# Patient Record
Sex: Male | Born: 1950
Health system: Southern US, Community
[De-identification: ages and names within clinical notes are randomized; demographics above are authoritative.]

## PROBLEM LIST (undated history)

## (undated) DIAGNOSIS — F3160 Bipolar disorder, current episode mixed, unspecified: Secondary | ICD-10-CM

## (undated) DIAGNOSIS — R001 Bradycardia, unspecified: Secondary | ICD-10-CM

## (undated) DIAGNOSIS — K2270 Barrett's esophagus without dysplasia: Secondary | ICD-10-CM

## (undated) DIAGNOSIS — K219 Gastro-esophageal reflux disease without esophagitis: Secondary | ICD-10-CM

## (undated) DIAGNOSIS — K5792 Diverticulitis of intestine, part unspecified, without perforation or abscess without bleeding: Secondary | ICD-10-CM

## (undated) DIAGNOSIS — G4730 Sleep apnea, unspecified: Secondary | ICD-10-CM

## (undated) DIAGNOSIS — N400 Enlarged prostate without lower urinary tract symptoms: Secondary | ICD-10-CM

## (undated) DIAGNOSIS — I10 Essential (primary) hypertension: Secondary | ICD-10-CM

## (undated) HISTORY — DX: Bipolar disorder, current episode mixed, unspecified: F31.60

## (undated) HISTORY — DX: Essential (primary) hypertension: I10

## (undated) HISTORY — PX: COLOSTOMY REVERSAL: SHX5782

## (undated) HISTORY — PX: OTHER SURGICAL HISTORY: SHX169

## (undated) HISTORY — DX: Barrett's esophagus without dysplasia: K22.70

## (undated) HISTORY — DX: Benign prostatic hyperplasia without lower urinary tract symptoms: N40.0

---
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
## 2005-08-04 DIAGNOSIS — K5792 Diverticulitis of intestine, part unspecified, without perforation or abscess without bleeding: Secondary | ICD-10-CM

## 2005-08-04 HISTORY — DX: Diverticulitis of intestine, part unspecified, without perforation or abscess without bleeding: K57.92

## 2012-08-04 LAB — BASIC METABOLIC PANEL
BUN: 12 mg/dL (ref 4–21)
CREATININE: 0.8 mg/dL (ref 0.6–1.3)
Glucose: 122 mg/dL
Potassium: 3.7 mmol/L (ref 3.4–5.3)

## 2012-08-04 LAB — HEPATIC FUNCTION PANEL
ALT: 21 U/L (ref 10–40)
AST: 19 U/L (ref 14–40)
Alkaline Phosphatase: 77 U/L (ref 25–125)
Bilirubin, Total: 1.3 mg/dL

## 2012-08-17 LAB — CALCIUM: Calcium: 10.2 mg/dL

## 2013-09-30 ENCOUNTER — Ambulatory Visit: Payer: Self-pay | Admitting: Family Medicine

## 2013-09-30 ENCOUNTER — Encounter: Payer: Self-pay | Admitting: Family Medicine

## 2013-09-30 ENCOUNTER — Ambulatory Visit (INDEPENDENT_AMBULATORY_CARE_PROVIDER_SITE_OTHER): Payer: BC Managed Care – PPO | Admitting: Family Medicine

## 2013-09-30 VITALS — BP 151/74 | HR 54 | Ht 70.0 in | Wt 212.0 lb

## 2013-09-30 DIAGNOSIS — N400 Enlarged prostate without lower urinary tract symptoms: Secondary | ICD-10-CM

## 2013-09-30 DIAGNOSIS — R259 Unspecified abnormal involuntary movements: Secondary | ICD-10-CM

## 2013-09-30 DIAGNOSIS — Z13220 Encounter for screening for lipoid disorders: Secondary | ICD-10-CM

## 2013-09-30 DIAGNOSIS — R7301 Impaired fasting glucose: Secondary | ICD-10-CM

## 2013-09-30 DIAGNOSIS — E785 Hyperlipidemia, unspecified: Secondary | ICD-10-CM

## 2013-09-30 DIAGNOSIS — F411 Generalized anxiety disorder: Secondary | ICD-10-CM

## 2013-09-30 DIAGNOSIS — K2270 Barrett's esophagus without dysplasia: Secondary | ICD-10-CM

## 2013-09-30 DIAGNOSIS — R251 Tremor, unspecified: Secondary | ICD-10-CM

## 2013-09-30 DIAGNOSIS — F3160 Bipolar disorder, current episode mixed, unspecified: Secondary | ICD-10-CM

## 2013-09-30 DIAGNOSIS — I10 Essential (primary) hypertension: Secondary | ICD-10-CM

## 2013-09-30 HISTORY — DX: Impaired fasting glucose: R73.01

## 2013-09-30 HISTORY — DX: Barrett's esophagus without dysplasia: K22.70

## 2013-09-30 HISTORY — DX: Bipolar disorder, current episode mixed, unspecified: F31.60

## 2013-09-30 HISTORY — DX: Benign prostatic hyperplasia without lower urinary tract symptoms: N40.0

## 2013-09-30 HISTORY — DX: Generalized anxiety disorder: F41.1

## 2013-09-30 HISTORY — DX: Essential (primary) hypertension: I10

## 2013-09-30 MED ORDER — DOXAZOSIN MESYLATE 4 MG PO TABS: 4.0000 mg | ORAL_TABLET | Freq: Every day | ORAL | Status: DC

## 2013-09-30 MED ORDER — PANTOPRAZOLE SODIUM 40 MG PO TBEC: 40.0000 mg | DELAYED_RELEASE_TABLET | Freq: Two times a day (BID) | ORAL | Status: DC

## 2013-09-30 NOTE — Progress Notes (Signed)
CC: Alexander [name] is a 63 y.o. male is here for Establish Care   Subjective: HPI:  Very pleasant 62 year old here to establish care  Patient reports a history of frequent urination at night. He will wait 2-3 times a night to urinate this has been present for over a year a nightly basis has not be getting better or worse recently. He believes he is been taking finasteride in the past without any benefit with urinary habits. In the morning he denies any urgency. There is been no dysuria, incontinence, nor blood in urine. No family history of prostate cancer he had a PSA in 2012 0.5.  Reports a history of essential hypertension he is currently taking benazepril and amlodipine. No outside blood pressures to report. Tries to stay active no formal exercise routine  History of Barrett's esophagus taking Protonix 40 mg twice a day without any difficulty swallowing or painful swallowing. Denies vomiting or regurgitation    On chart review he has a history of impaired fasting glucose with sugar checked most recently a little over a year ago.  He has a history of bipolar disorder currently seeing a psychiatrist his lithium level was checked last week and he believes it was therapeutic. His psychiatrist is also managing generalized anxiety disorder  He is in acute complaint today of a tremor of his right leg that has been present for over 5 years occurs during purposeful movements also at rest. It is been present ever since he had surgery for perforated diverticula. Denies tremors elsewhere, denies family history for Parkinson's. Denies personal history of falls nor rigidity. Tremor is not interfering with quality of life at this time   Review of Systems - General ROS: negative for - chills, fever, night sweats, weight gain or weight loss Ophthalmic ROS: negative for - decreased vision Psychological ROS: negative for - anxiety or depression ENT ROS: negative for - hearing change, nasal congestion,  tinnitus or allergies Hematological and Lymphatic ROS: negative for - bleeding problems, bruising or swollen lymph nodes Breast ROS: negative Respiratory ROS: no cough, shortness of breath, or wheezing Cardiovascular ROS: no chest pain or dyspnea on exertion Gastrointestinal ROS: no abdominal pain, change in bowel habits, or black or bloody stools Genito-Urinary ROS: negative for - genital discharge, genital ulcers, incontinence or abnormal bleeding from genitals Musculoskeletal ROS: negative for - joint pain or muscle pain Neurological ROS: negative for - headaches or memory loss Dermatological ROS: negative for lumps, mole changes, rash and skin lesion changes  Past Medical History  Diagnosis Date  . Barrett's esophagus 09/30/2013  . Bipolar 1 disorder, mixed 09/30/2013    [facility], Dr. Ardine Eng   . BPH (benign prostatic hyperplasia) 09/30/2013  . Essential hypertension, benign 09/30/2013    Past Surgical History  Procedure Laterality Date  . Colostomy reversal    . Large bowel perforation     No family history on file.  History   Social History  . Marital Status: Married    Spouse Name: N/A    Number of Children: N/A  . Years of Education: N/A   Occupational History  . Not on file.   Social History Main Topics  . Smoking status: Former Smoker    Quit date: 09/30/1980  . Smokeless tobacco: Not on file  . Alcohol Use: 7.0 oz/week    14 drink(s) per week  . Drug Use: No  . Sexual Activity: Not Currently    Partners: Female   Other Topics Concern  . Not  on file   Social History Narrative  . No narrative on file     Objective: BP 151/74  Pulse 54  Ht 5\' 10"  (1.778 m)  Wt 212 lb (96.163 kg)  BMI 30.42 kg/m2   General: Alert and Oriented, No Acute Distress HEENT: Pupils equal, round, reactive to light. Conjunctivae clear.  Moist mucous membranes pharynx unremarkable Lungs: Clear to auscultation bilaterally, no wheezing/ronchi/rales.  Comfortable work of  breathing. Good air movement. Cardiac: Regular rate and rhythm. Normal S1/S2.  No murmurs, rubs, nor gallops.   Neuro: CN II-XII grossly intact, full strength/rom of all four extremities, gait normal, rapid alternating movements normal, heel-shin test normal, no cogwheeling in the upper extremities Extremities: No peripheral edema.  Strong peripheral pulses.  Mental Status: No depression, anxiety, nor agitation. Skin: Warm and dry.  Assessment & Plan: Alexander [name] was seen today for establish care.  Diagnoses and associated orders for this visit:  Essential hypertension, benign - BASIC METABOLIC PANEL WITH GFR  Barrett's esophagus - pantoprazole (PROTONIX) 40 MG tablet; Take 1 tablet (40 mg total) by mouth 2 (two) times daily.  Impaired fasting glucose  BPH (benign prostatic hyperplasia) - doxazosin (CARDURA) 4 MG tablet; Take 1 tablet (4 mg total) by mouth daily.  Bipolar 1 disorder, mixed  Generalized anxiety disorder  Lipid screening - Lipid panel  Tremor  Hyperlipidemia    Essential hypertension: Uncontrolled starting doxazosin Barrett's esophagus: Controlled continue Protonix twice a day Impaired fasting glucose: Recheck fasting glucose today BPH: Uncontrolled starting doxazosin Bipolar 1 disorder and generalized anxiety disorder: Controlled continue to see psychiatry Hyperlipidemia: Uncontrolled as of most recent lipid panel 2012 repeating fasting lipid panel today   Return in about 4 weeks (around 10/28/2013).

## 2013-10-01 LAB — BASIC METABOLIC PANEL WITH GFR
BUN: 11 mg/dL (ref 6–23)
CHLORIDE: 103 meq/L (ref 96–112)
CO2: 26 mEq/L (ref 19–32)
Calcium: 10 mg/dL (ref 8.4–10.5)
Creat: 0.86 mg/dL (ref 0.50–1.35)
Glucose, Bld: 108 mg/dL — ABNORMAL HIGH (ref 70–99)
Potassium: 4.1 mEq/L (ref 3.5–5.3)
SODIUM: 139 meq/L (ref 135–145)

## 2013-10-01 LAB — LIPID PANEL
CHOL/HDL RATIO: 5 ratio
CHOLESTEROL: 235 mg/dL — AB (ref 0–200)
HDL: 47 mg/dL (ref >39)
LDL Cholesterol: 134 mg/dL — ABNORMAL HIGH (ref 0–99)
Triglycerides: 270 mg/dL — ABNORMAL HIGH (ref <150)
VLDL: 54 mg/dL — AB (ref 0–40)

## 2013-10-03 ENCOUNTER — Telehealth: Payer: Self-pay | Admitting: Family Medicine

## 2013-10-03 DIAGNOSIS — E785 Hyperlipidemia, unspecified: Secondary | ICD-10-CM | POA: Insufficient documentation

## 2013-10-03 HISTORY — DX: Hyperlipidemia, unspecified: E78.5

## 2013-10-03 MED ORDER — ATORVASTATIN CALCIUM 20 MG PO TABS: 20.0000 mg | ORAL_TABLET | Freq: Every day | ORAL | Status: DC

## 2013-10-03 NOTE — Telephone Encounter (Signed)
Pt.notified

## 2013-10-03 NOTE — Telephone Encounter (Signed)
Seth Bake, Will you please let Mr. Lannen know that his cholesterol values are elevated and put him at a 19.5% risk of having a heart attack in the next ten years.  Current guidelines from the american heart association lead me to strongly recommend that he start a cholesterol lowering medication called atorvastatin to help lower his numbers and his risk.  [facility], we'll want to recheck this in 3 months.  Kidney function was normal and fasting blood sugar remains in the pre-diabetes range, no other recommendations at this time.

## 2013-10-20 ENCOUNTER — Encounter: Payer: Self-pay | Admitting: Family Medicine

## 2013-10-20 DIAGNOSIS — Z8719 Personal history of other diseases of the digestive system: Secondary | ICD-10-CM | POA: Insufficient documentation

## 2013-10-20 DIAGNOSIS — Z299 Encounter for prophylactic measures, unspecified: Secondary | ICD-10-CM

## 2013-10-20 HISTORY — DX: Personal history of other diseases of the digestive system: Z87.19

## 2013-10-20 HISTORY — DX: Encounter for prophylactic measures, unspecified: Z29.9

## 2013-10-26 ENCOUNTER — Encounter: Payer: Self-pay | Admitting: *Deleted

## 2013-10-28 ENCOUNTER — Ambulatory Visit (INDEPENDENT_AMBULATORY_CARE_PROVIDER_SITE_OTHER): Payer: BC Managed Care – PPO | Admitting: Family Medicine

## 2013-10-28 ENCOUNTER — Encounter: Payer: Self-pay | Admitting: Family Medicine

## 2013-10-28 VITALS — BP 130/73 | HR 63 | Wt 214.0 lb

## 2013-10-28 DIAGNOSIS — I10 Essential (primary) hypertension: Secondary | ICD-10-CM

## 2013-10-28 DIAGNOSIS — N400 Enlarged prostate without lower urinary tract symptoms: Secondary | ICD-10-CM

## 2013-10-28 DIAGNOSIS — E785 Hyperlipidemia, unspecified: Secondary | ICD-10-CM

## 2013-10-28 MED ORDER — DOXAZOSIN MESYLATE 4 MG PO TABS: 8.0000 mg | ORAL_TABLET | Freq: Every day | ORAL | Status: DC

## 2013-10-28 MED ORDER — AMLODIPINE BESYLATE 10 MG PO TABS: 10.0000 mg | ORAL_TABLET | Freq: Every day | ORAL | Status: DC

## 2013-10-28 NOTE — Progress Notes (Signed)
CC: Alexander [name] is a 63 y.o. male is here for Hypertension   Subjective: HPI:  Followup BPH: Started doxazosin at our last visit he has been taking this daily without known side effects. Reports he is awaking 1-2 times a night to urinate he is very happy with the response. Denies urinary hesitancy, urgency, nor sensation of incomplete voiding  Followup essential hypertension: Continues on amlodipine, benazepril, and new doxazosin. No outside blood pressures to report. Denies chest pain shortness of breath orthopnea nor peripheral edema  Followup hyperlipidemia: After his last visit he was recommended to start atorvastatin due to elevated LDL, he's been taking this on a daily basis without right upper quadrant pain nor myalgias.   Review Of Systems Outlined In HPI  Past Medical History  Diagnosis Date  . Barrett's esophagus 09/30/2013  . Bipolar 1 disorder, mixed 09/30/2013    [facility], Dr. Ardine Eng   . BPH (benign prostatic hyperplasia) 09/30/2013  . Essential hypertension, benign 09/30/2013    Past Surgical History  Procedure Laterality Date  . Colostomy reversal    . Large bowel perforation     No family history on file.  History   Social History  . Marital Status: Married    Spouse Name: N/A    Number of Children: N/A  . Years of Education: N/A   Occupational History  . Not on file.   Social History Main Topics  . Smoking status: Former Smoker    Quit date: 09/30/1980  . Smokeless tobacco: Not on file  . Alcohol Use: 7.0 oz/week    14 drink(s) per week  . Drug Use: No  . Sexual Activity: Not Currently    Partners: Female   Other Topics Concern  . Not on file   Social History Narrative  . No narrative on file     Objective: BP 130/73  Pulse 63  Wt 214 lb (97.07 kg)  General: Alert and Oriented, No Acute Distress HEENT: Pupils equal, round, reactive to light. Conjunctivae clear.  Moist mucous membranes pharynx unremarkable Lungs: Clear to  auscultation bilaterally, no wheezing/ronchi/rales.  Comfortable work of breathing. Good air movement. Cardiac: Regular rate and rhythm. Normal S1/S2.  No murmurs, rubs, nor gallops.   Extremities: No peripheral edema.  Strong peripheral pulses.  Mental Status: No depression, anxiety, nor agitation. Skin: Warm and dry.  Assessment & Plan: Alexander [name] was seen today for hypertension.  Diagnoses and associated orders for this visit:  BPH (benign prostatic hyperplasia) - doxazosin (CARDURA) 4 MG tablet; Take 2 tablets (8 mg total) by mouth daily.  Essential hypertension, benign - amLODipine (NORVASC) 10 MG tablet; Take 1 tablet (10 mg total) by mouth daily.  Hyperlipidemia    BPH: Improving but still uncontrolled increasing doxazosin now 8 mg daily Essential hypertension: Controlled and improved continue antihypertensive regimen Hyperlipidemia: Tolerating atorvastatin we'll need cholesterol check in 2 months   Return in about 2 months (around 12/28/2013) for Cholesterol followup.

## 2013-12-28 ENCOUNTER — Encounter: Payer: Self-pay | Admitting: Family Medicine

## 2013-12-28 ENCOUNTER — Ambulatory Visit (INDEPENDENT_AMBULATORY_CARE_PROVIDER_SITE_OTHER): Payer: BC Managed Care – PPO | Admitting: Family Medicine

## 2013-12-28 ENCOUNTER — Telehealth: Payer: Self-pay | Admitting: Family Medicine

## 2013-12-28 VITALS — BP 142/76 | HR 47 | Wt 215.0 lb

## 2013-12-28 DIAGNOSIS — Z5181 Encounter for therapeutic drug level monitoring: Secondary | ICD-10-CM

## 2013-12-28 DIAGNOSIS — E785 Hyperlipidemia, unspecified: Secondary | ICD-10-CM

## 2013-12-28 DIAGNOSIS — Z79899 Other long term (current) drug therapy: Secondary | ICD-10-CM

## 2013-12-28 DIAGNOSIS — Z9989 Dependence on other enabling machines and devices: Secondary | ICD-10-CM

## 2013-12-28 DIAGNOSIS — M25519 Pain in unspecified shoulder: Secondary | ICD-10-CM

## 2013-12-28 DIAGNOSIS — G4733 Obstructive sleep apnea (adult) (pediatric): Secondary | ICD-10-CM | POA: Insufficient documentation

## 2013-12-28 DIAGNOSIS — L738 Other specified follicular disorders: Secondary | ICD-10-CM

## 2013-12-28 DIAGNOSIS — R7301 Impaired fasting glucose: Secondary | ICD-10-CM

## 2013-12-28 DIAGNOSIS — L731 Pseudofolliculitis barbae: Secondary | ICD-10-CM

## 2013-12-28 DIAGNOSIS — I10 Essential (primary) hypertension: Secondary | ICD-10-CM

## 2013-12-28 DIAGNOSIS — M25511 Pain in right shoulder: Secondary | ICD-10-CM

## 2013-12-28 DIAGNOSIS — L678 Other hair color and hair shaft abnormalities: Secondary | ICD-10-CM

## 2013-12-28 HISTORY — DX: Obstructive sleep apnea (adult) (pediatric): G47.33

## 2013-12-28 LAB — HEPATIC FUNCTION PANEL
ALBUMIN: 4.3 g/dL (ref 3.5–5.2)
ALT: 38 U/L (ref 0–53)
AST: 26 U/L (ref 0–37)
Alkaline Phosphatase: 78 U/L (ref 39–117)
Bilirubin, Direct: 0.2 mg/dL (ref 0.0–0.3)
Indirect Bilirubin: 1 mg/dL (ref 0.2–1.2)
Total Bilirubin: 1.2 mg/dL (ref 0.2–1.2)
Total Protein: 6.8 g/dL (ref 6.0–8.3)

## 2013-12-28 LAB — HEMOGLOBIN A1C
Hgb A1c MFr Bld: 5.5 % (ref <5.7)
MEAN PLASMA GLUCOSE: 111 mg/dL (ref <117)

## 2013-12-28 LAB — LIPID PANEL
CHOL/HDL RATIO: 2.9 ratio
Cholesterol: 160 mg/dL (ref 0–200)
HDL: 55 mg/dL (ref >39)
LDL Cholesterol: 75 mg/dL (ref 0–99)
Triglycerides: 149 mg/dL (ref <150)
VLDL: 30 mg/dL (ref 0–40)

## 2013-12-28 MED ORDER — BENAZEPRIL HCL 20 MG PO TABS: 20.0000 mg | ORAL_TABLET | Freq: Every day | ORAL | Status: DC

## 2013-12-28 MED ORDER — CLINDAMYCIN HCL 300 MG PO CAPS: 300.0000 mg | ORAL_CAPSULE | Freq: Three times a day (TID) | ORAL | Status: DC

## 2013-12-28 NOTE — Telephone Encounter (Signed)
Alexander [name], We please see if [facility] has this patient already in their system. If so he is requesting new CPAP mask and supplies, if so can you please help arrange this.  If he is not already in their system can you also please let me know so I can look for which sleep Center he had his original sleep study done?

## 2013-12-28 NOTE — Progress Notes (Addendum)
CC: Alexander [name] is a 63 y.o. male is here for f/u fasting labs   Subjective: HPI:  Followup hyperlipidemia: Continues to take Lipitor on a daily basis without myalgias nor right upper quadrant pain. He's been sticking to a low fat diet with the help of his wife. No formal exercise routine  Followup hypertension: Continues on amlodipine, benazepril, doxazosin on a daily basis. Reports blood pressures at home consistently below 140/90. He did not take any of his medication today. Denies chest pain shortness of breath orthopnea nor peripheral edema. No motor or sensory disturbances  History of impaired fasting glucose with a mildly elevated glucose fasting at his most recent visit. No polyuria polyphasia or polydipsia  On the back of his left thigh he has a tender red lump that has been present for the past 5 days. Worse the more he squeezes it. Nothing particularly makes it better. Interventions have included a Band-Aid. Denies discharge. Denies fevers, chills, nor personal history of skin cancers  Complains of right shoulder pain has been present for one month it has slowly been improving without any intervention other than rest. It is described as a deep pain that is worse with putting his hand behind his back. It is nonradiating. Denies any recent or remote overexertion. Denies overlying swelling redness or warmth nor weakness in the right upper extremity. Denies neck pain  Tells me has a history of obstructive sleep apnea currently using a CPAP machine and states that the housing and mask is starting to crack. He believes his most recent sleep study was done sometime around a decade ago at the [facility] sleep lab he is unsure of specifics beyond that.  He uses CPAP nightly, he feels he benefits from CPAP    Review Of Systems Outlined In HPI  Past Medical History  Diagnosis Date  . Barrett's esophagus 09/30/2013  . Bipolar 1 disorder, mixed 09/30/2013    [facility], Dr. Ardine Eng    . BPH (benign prostatic hyperplasia) 09/30/2013  . Essential hypertension, benign 09/30/2013    Past Surgical History  Procedure Laterality Date  . Colostomy reversal    . Large bowel perforation     No family history on file.  History   Social History  . Marital Status: Married    Spouse Name: N/A    Number of Children: N/A  . Years of Education: N/A   Occupational History  . Not on file.   Social History Main Topics  . Smoking status: Former Smoker    Quit date: 09/30/1980  . Smokeless tobacco: Not on file  . Alcohol Use: 7.0 oz/week    14 drink(s) per week  . Drug Use: No  . Sexual Activity: Not Currently    Partners: Female   Other Topics Concern  . Not on file   Social History Narrative  . No narrative on file     Objective: BP 142/76  Pulse 47  Wt 215 lb (97.523 kg)  General: Alert and Oriented, No Acute Distress HEENT: Pupils equal, round, reactive to light. Conjunctivae clear.  Moist mucous membranes pharynx unremarkable Lungs: Clear to auscultation bilaterally, no wheezing/ronchi/rales.  Comfortable work of breathing. Good air movement. Cardiac: Regular rate and rhythm. Normal S1/S2.  No murmurs, rubs, nor gallops.   Abdomen: Soft nontender Extremities: No peripheral edema.  Strong peripheral pulses. Right shoulder exam reveals full range of motion and strength in all planes of motion and with individual rotator cuff testing. No overlying redness warmth or swelling.  Neer's test negative.  Hawkins test negative. Empty can negative. Crossarm test negative. O'Brien's test positive. Apprehension test negative. Speed's test negative. Pain is reproduced with resisted internal rotation of the humerus on the back Mental Status: No depression, anxiety, nor agitation. Skin: Warm and dry. On the posterior medial left thigh there is a pustule approximately 3-4 mm in dim at her and slightly raised, erythematous surrounding a hair follicle  Assessment & Plan: Makhari Dovidio  was seen today for f/u fasting labs.  Diagnoses and associated orders for this visit:  Essential hypertension, benign - benazepril (LOTENSIN) 20 MG tablet; Take 1 tablet (20 mg total) by mouth daily.  Hyperlipidemia - Hepatic function panel - Lipid panel  Impaired fasting glucose - Hemoglobin A1c  Encounter for monitoring statin therapy - Hepatic function panel  Ingrown hair - clindamycin (CLEOCIN) 300 MG capsule; Take 1 capsule (300 mg total) by mouth 3 (three) times daily.  Right shoulder pain  OSA on CPAP    Essential hypertension: Uncontrolled encouraged him to take all of his medications on a daily basis even if fasting, suspect his mild elevation today is due to not taking medications hyperlipidemia: Due for repeat lipid panel along with liver enzymes Impaired fasting glucose clinically controlled to do for A1c Right shoulder pain: Discussed possibility of rotator cuff tendinitis or even labral tear, he would prefer no further intervention since pain is improving on its own. Discussed injections available with me and our colleagues if needed in the future Ingrown hair: Start clindamycin and warm compresses 15 minutes at a time 4 times a day Obstructive sleep apnea: We will attempt to obtain his most recent sleep study and then will look into [facility] to help with supplies.  He could benefit from a new CPAP with new technology and new supplies.   Return in about 3 months (around 03/30/2014).

## 2013-12-29 ENCOUNTER — Telehealth: Payer: Self-pay | Admitting: Family Medicine

## 2013-12-29 MED ORDER — ATORVASTATIN CALCIUM 20 MG PO TABS: 20.0000 mg | ORAL_TABLET | Freq: Every day | ORAL | Status: DC

## 2013-12-29 NOTE — Telephone Encounter (Signed)
Pt notified via vm

## 2013-12-29 NOTE — Telephone Encounter (Signed)
Alexander [name], Will you please let patient know that his cholesterol has drastically improved and is now perfect.  I'd recommend he continue taking atorvastatin since no signs of liver inflammation were present.  [facility]  F/U in about three months.

## 2013-12-29 NOTE — Telephone Encounter (Signed)
Called and left message for pt to let us know where he had original sleep study done;[facility] does not have a sleep study in their system

## 2013-12-29 NOTE — Telephone Encounter (Signed)
Pt.notified

## 2014-01-05 NOTE — Telephone Encounter (Signed)
Seth Bake, If he can let me know who had been servicing or providing him with his CPAP supplies I may be able to request new equipment without having to track down his old sleep study.

## 2014-01-06 NOTE — Telephone Encounter (Signed)
It was [facility] who did last supply him back in Dec 2014 however their policy has changed and they now require sleep study to be on file

## 2014-01-09 NOTE — Telephone Encounter (Signed)
Alexander [name], Will you please make sure the patient is aware that a new sleep study would be required for new equipment or parts.  If he's interested in this I can arrange it through our  sleep lab.  Just let me know if he'd like to go through with it.

## 2014-01-09 NOTE — Telephone Encounter (Signed)
They are aware. When I first spoke with his wife I informed her of this policy change. And she said they would call us back and let us know when they tracked it down

## 2014-01-12 MED ORDER — AMBULATORY NON FORMULARY MEDICATION: Status: DC

## 2014-01-12 NOTE — Telephone Encounter (Signed)
Alexander [name], I spoke with Jeneen Rinks about this.  I think it's all taken care of now.  Will you ask patient to let me know if not contacted about supplies by next week.

## 2014-01-12 NOTE — Telephone Encounter (Signed)
Pt's spouse notified.

## 2014-01-12 NOTE — Addendum Note (Signed)
Addended by: Marcial Pacas on: 01/12/2014 12:24 PM   Modules accepted: Orders

## 2014-01-12 NOTE — Telephone Encounter (Signed)
Seth Bake, Will you please let [facility] know that we now have Alexander [name]'s sleep study.  It is from 2004 and I'm wondering if [facility] can use this to prove him with CPAP supplies, he already has a machine but the mask and tubing is breaking.  Report in your inbox, please place in scan folder after you're done with it.

## 2014-03-11 ENCOUNTER — Other Ambulatory Visit: Payer: Self-pay | Admitting: Family Medicine

## 2014-04-08 ENCOUNTER — Other Ambulatory Visit: Payer: Self-pay | Admitting: Family Medicine

## 2014-04-12 ENCOUNTER — Encounter: Payer: Self-pay | Admitting: Family Medicine

## 2014-04-12 ENCOUNTER — Ambulatory Visit (INDEPENDENT_AMBULATORY_CARE_PROVIDER_SITE_OTHER): Payer: BC Managed Care – PPO | Admitting: Family Medicine

## 2014-04-12 VITALS — BP 126/63 | HR 59 | Wt 219.0 lb

## 2014-04-12 DIAGNOSIS — Z23 Encounter for immunization: Secondary | ICD-10-CM

## 2014-04-12 DIAGNOSIS — I10 Essential (primary) hypertension: Secondary | ICD-10-CM

## 2014-04-12 DIAGNOSIS — IMO0002 Reserved for concepts with insufficient information to code with codable children: Secondary | ICD-10-CM

## 2014-04-12 DIAGNOSIS — Z9989 Dependence on other enabling machines and devices: Secondary | ICD-10-CM

## 2014-04-12 DIAGNOSIS — L74519 Primary focal hyperhidrosis, unspecified: Secondary | ICD-10-CM

## 2014-04-12 DIAGNOSIS — N400 Enlarged prostate without lower urinary tract symptoms: Secondary | ICD-10-CM

## 2014-04-12 DIAGNOSIS — E785 Hyperlipidemia, unspecified: Secondary | ICD-10-CM

## 2014-04-12 DIAGNOSIS — M76892 Other specified enthesopathies of left lower limb, excluding foot: Secondary | ICD-10-CM

## 2014-04-12 DIAGNOSIS — G4733 Obstructive sleep apnea (adult) (pediatric): Secondary | ICD-10-CM

## 2014-04-12 DIAGNOSIS — R61 Generalized hyperhidrosis: Secondary | ICD-10-CM

## 2014-04-12 MED ORDER — ALUMINUM CHLORIDE IN ALCOHOL 6.25 % EX SOLN: CUTANEOUS | Status: DC

## 2014-04-12 MED ORDER — ATORVASTATIN CALCIUM 20 MG PO TABS: 20.0000 mg | ORAL_TABLET | Freq: Every day | ORAL | Status: DC

## 2014-04-12 MED ORDER — DOXAZOSIN MESYLATE 4 MG PO TABS: ORAL_TABLET | ORAL | Status: DC

## 2014-04-12 NOTE — Progress Notes (Signed)
CC: Alexander [name] is a 63 y.o. male is here for Hypertension   Subjective: HPI:  Follow BPH: Continues on doxazosin on a daily basis, 8 mg a day. Ever since he started this medication he is no longer awakening multiple times during the night to urinate. He estimates 0-1 times a night he has to wake up to urinate. Denies weak stream, urinary hesitancy, nor straining to urinate.  Followup essential hypertension: No outside blood pressures to report. He's back to taking his medication on a daily basis since I saw him last taking amlodipine and benazepril without known intolerances. Denies chest pain shortness of breath orthopnea nor peripheral edema  Followup hyperlipidemia: Continues to take Lipitor a daily basis without side effects or known intolerances. Denies right upper quadrant pain nor myalgias. When his cholesterol was checked after he started this medication it had drastically improved into the normal range.  Followup OSA: Since I saw him last he has had new supplies delivered for his CPAP machine and is using it on a nightly basis and states that he believes he gets benefit from restorative sleep now.  Complains of left knee pain that has been present for the past one to 2 months on a daily basis. Described as a sharp discomfort on the medial lower anterior aspect of the knee. Symptoms are moderate to severe in severity for the first 10-20 steps and then resolve 100% after he has been moving beyond the above distance. Denies any pain at rest. Denies any catching locking or giving way but he does report occasional feels unstable. Denies any recent or remote trauma. Denies swelling redness nor warmth around the joint.  Complains of malodorous feet over the past 3-4 months on a daily basis unless he wears sandals. Interventions have included multiple over-the-counter powders without much benefit. Describes symptoms of severe in severity. Review of systems is positive for Frequent sweating of  the feet even when they're not covered. He denies any skin changes involving the foot.   Review Of Systems Outlined In HPI  Past Medical History  Diagnosis Date  . Barrett's esophagus 09/30/2013  . Bipolar 1 disorder, mixed 09/30/2013    [facility], Dr. Ardine Eng   . BPH (benign prostatic hyperplasia) 09/30/2013  . Essential hypertension, benign 09/30/2013    Past Surgical History  Procedure Laterality Date  . Colostomy reversal    . Large bowel perforation     No family history on file.  History   Social History  . Marital Status: Married    Spouse Name: N/A    Number of Children: N/A  . Years of Education: N/A   Occupational History  . Not on file.   Social History Main Topics  . Smoking status: Former Smoker    Quit date: 09/30/1980  . Smokeless tobacco: Not on file  . Alcohol Use: 7.0 oz/week    14 drink(s) per week  . Drug Use: No  . Sexual Activity: Not Currently    Partners: Female   Other Topics Concern  . Not on file   Social History Narrative  . No narrative on file     Objective: BP 126/63  Pulse 59  Wt 219 lb (99.338 kg)  General: Alert and Oriented, No Acute Distress HEENT: Pupils equal, round, reactive to light. Conjunctivae clear.   moist membranes pharynx unremarkable  Lungs: Clear to auscultation bilaterally, no wheezing/ronchi/rales.  Comfortable work of breathing. Good air movement. Cardiac: Regular rate and rhythm. Normal S1/S2.  No murmurs,  rubs, nor gallops.   Left knee exam shows full-strength and range of motion. There is no swelling, redness, nor warmth overlying the knee.  No patellar crepitus. No patellar apprehension. No pain with palpation of the inferior patellar pole.  No laxity with valgus nor varus stress however he does experience reproduction of his pain with varus stress. Anterior drawer is negative. McMurray's negative. No popliteal space tenderness or palpable mass. No medial or lateral joint line tenderness to  palpation. Extremities: No peripheral edema.  Strong peripheral pulses.  Mental Status: No depression, anxiety, nor agitation. Skin: Warm and dry.  Assessment & Plan: Alexander [name] was seen today for hypertension.  Diagnoses and associated orders for this visit:  BPH (benign prostatic hyperplasia) - doxazosin (CARDURA) 4 MG tablet; TAKE 2 TABLETS (8 MG TOTAL) BY MOUTH DAILY.  Essential hypertension, benign  Hyperlipidemia  OSA on CPAP  Pes anserinus tendinitis, left  Hyperhydrosis disorder - Aluminum Chloride in Alcohol (XERAC AC) 6.25 % SOLN; Apply to feet nightly to prevent smell and sweating.  Need for prophylactic vaccination and inoculation against influenza    BPH: Controlled continue doxazosin Essential hypertension: Controlled continue benazepril and amlodipine  Hyperlipidemia: Controlled continue atorvastatin repeat cholesterol and hepatic function panel in spring 2016 OSA: Controlled continue CPAP Left knee pain is thought to be due to tendinitis, specifically pes anserinus tendinitis.  He was given a handout referring to rehabilitation exercises and stretching to be done on a daily basis the next 3-4 weeks Suspect that his malodorous feet are due to more than average sweating therefore when it unable to wear sandals during the upcoming fall and winter months begin aluminum chloride prep applications to the feet for hyperhidrosis  40 minutes spent face-to-face during visit today of which at least 50% was counseling or coordinating care regarding: 1. BPH (benign prostatic hyperplasia)   2. Essential hypertension, benign   3. Hyperlipidemia   4. OSA on CPAP   5. Pes anserinus tendinitis, left   6. Hyperhydrosis disorder   7. Need for prophylactic vaccination and inoculation against influenza      Return in about 3 months (around 07/12/2014) for BP follow up.

## 2014-07-25 ENCOUNTER — Other Ambulatory Visit: Payer: Self-pay | Admitting: Family Medicine

## 2014-07-31 ENCOUNTER — Telehealth: Payer: Self-pay | Admitting: Family Medicine

## 2014-07-31 ENCOUNTER — Encounter: Payer: Self-pay | Admitting: Family Medicine

## 2014-07-31 ENCOUNTER — Ambulatory Visit (INDEPENDENT_AMBULATORY_CARE_PROVIDER_SITE_OTHER): Payer: BC Managed Care – PPO | Admitting: Family Medicine

## 2014-07-31 ENCOUNTER — Ambulatory Visit (INDEPENDENT_AMBULATORY_CARE_PROVIDER_SITE_OTHER): Payer: BC Managed Care – PPO

## 2014-07-31 VITALS — BP 124/68 | HR 47 | Temp 97.7°F | Wt 208.0 lb

## 2014-07-31 DIAGNOSIS — R197 Diarrhea, unspecified: Secondary | ICD-10-CM | POA: Diagnosis not present

## 2014-07-31 DIAGNOSIS — R112 Nausea with vomiting, unspecified: Secondary | ICD-10-CM

## 2014-07-31 MED ORDER — METRONIDAZOLE 500 MG PO TABS: 500.0000 mg | ORAL_TABLET | Freq: Three times a day (TID) | ORAL | Status: DC

## 2014-07-31 MED ORDER — CIPROFLOXACIN HCL 500 MG PO TABS: 500.0000 mg | ORAL_TABLET | Freq: Two times a day (BID) | ORAL | Status: DC

## 2014-07-31 NOTE — Telephone Encounter (Signed)
Seth Bake, Will you please let patient know that his xray did not show any signs of an obstruction therefore I'd recommend starting both ciprofloxacin and metronidazole to treat diverticulitis. [facility] If no better by Wednesday or worsening between now and then let me know, the next step would be a CT scan of the abdomen and pelvis.

## 2014-07-31 NOTE — Telephone Encounter (Signed)
Message left on vm with results

## 2014-07-31 NOTE — Progress Notes (Signed)
CC: Alexander [name] is a 63 y.o. male is here for abd pain/vomiting   Subjective: HPI:  Hard to described mild to moderate low abdominal pain that is nonradiating that has been present for the past week. Over the past 3 days he's had episodes of vomiting that come on suddenly without real warning. States he feels overall better for a few hours ago after vomiting only for symptoms to slowly return. He's also had uncharacteristic diarrhea that comes in frequent small bursts at a frequency of every 10 minutes after eating any meal. This usually occurs 3-4 times and then stops until he eats again. No interventions as of yet. He denies fevers, chills, cough, shortness of breath, sweats nor dysuria. He denies any other genitourinary or gastrointestinal complaints. Symptoms seem to fluctuate up today   Review Of Systems Outlined In HPI  Past Medical History  Diagnosis Date  . Barrett's esophagus 09/30/2013  . Bipolar 1 disorder, mixed 09/30/2013    [facility], Dr. Ardine Eng   . BPH (benign prostatic hyperplasia) 09/30/2013  . Essential hypertension, benign 09/30/2013    Past Surgical History  Procedure Laterality Date  . Colostomy reversal    . Large bowel perforation     No family history on file.  History   Social History  . Marital Status: Married    Spouse Name: N/A    Number of Children: N/A  . Years of Education: N/A   Occupational History  . Not on file.   Social History Main Topics  . Smoking status: Former Smoker    Quit date: 09/30/1980  . Smokeless tobacco: Not on file  . Alcohol Use: 7.0 oz/week    14 drink(s) per week  . Drug Use: No  . Sexual Activity:    Partners: Female   Other Topics Concern  . Not on file   Social History Narrative     Objective: BP 124/68 mmHg  Pulse 47  Temp(Src) 97.7 F (36.5 C) (Oral)  Wt 208 lb (94.348 kg)  General: Alert and Oriented, No Acute Distress HEENT: Pupils equal, round, reactive to light. Conjunctivae clear.  Moist  mucous membranes Unremarkable Lungs: Clear to auscultation bilaterally, no wheezing/ronchi/rales.  Comfortable work of breathing. Good air movement. Cardiac: Regular rate and rhythm. Normal S1/S2.  No murmurs, rubs, nor gallops.   Abdomen: Hyperactive bowel sounds. Soft nontender with rubbery and firm fullness underlying and three fingerbreadths surrounding his central surgical site. No rebound or guarding. Extremities: No peripheral edema.  Strong peripheral pulses.  Mental Status: No depression, anxiety, nor agitation. Skin: Warm and dry.  Assessment & Plan: Alexander [name] was seen today for abd pain/vomiting.  Diagnoses and associated orders for this visit:  Diarrhea - DG Abd 2 Views; Future  Non-intractable vomiting with nausea, vomiting of unspecified type - DG Abd 2 Views; Future    Diarrhea and vomiting: X-ray will be obtained to evaluate for obstruction, volvulus, or fecal impaction. If none of the above is seen will prescribe Cipro and Flagyl as he tells me that his symptoms right now are similar to that which preceded diverticulitis in the remote past.  Return if symptoms worsen or fail to improve.

## 2014-10-31 ENCOUNTER — Other Ambulatory Visit: Payer: Self-pay | Admitting: Family Medicine

## 2014-11-01 NOTE — Telephone Encounter (Signed)
Called in Rx to pharmacy.

## 2014-12-25 ENCOUNTER — Ambulatory Visit (INDEPENDENT_AMBULATORY_CARE_PROVIDER_SITE_OTHER): Payer: BLUE CROSS/BLUE SHIELD

## 2014-12-25 ENCOUNTER — Encounter: Payer: Self-pay | Admitting: Family Medicine

## 2014-12-25 ENCOUNTER — Ambulatory Visit (INDEPENDENT_AMBULATORY_CARE_PROVIDER_SITE_OTHER): Payer: BLUE CROSS/BLUE SHIELD | Admitting: Family Medicine

## 2014-12-25 VITALS — BP 136/75 | HR 48 | Wt 214.0 lb

## 2014-12-25 DIAGNOSIS — L918 Other hypertrophic disorders of the skin: Secondary | ICD-10-CM

## 2014-12-25 DIAGNOSIS — M79645 Pain in left finger(s): Secondary | ICD-10-CM | POA: Diagnosis not present

## 2014-12-25 DIAGNOSIS — G4733 Obstructive sleep apnea (adult) (pediatric): Secondary | ICD-10-CM | POA: Diagnosis not present

## 2014-12-25 DIAGNOSIS — L089 Local infection of the skin and subcutaneous tissue, unspecified: Secondary | ICD-10-CM | POA: Diagnosis not present

## 2014-12-25 NOTE — Progress Notes (Signed)
CC: Alexander [name] is a 64 y.o. male is here for lesion on the back and sleep study   Subjective: HPI:   left thumb pain described as electric shock  That lasts only a fraction of the second. It comes on if he is doing any weight lifting laundry or dishes. 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 Pain is nonradiating moderate severity and nothing seems to make it better or worse other than above. No interventions as of yet. He denies recent overexertion or trauma. Denies swelling redness warmth or any overlying skin changes. Denies any other motor or sensory disturbances.  He's using his CPAP machine on a nightly basis and states that the face mask is starting to break. machine has been more ineffective since leaks have occurred.  Complains of a growth on the left flank has been present for matter of years and it's getting bigger on a monthly basis. It's now painful on a daily basis due to friction from his shirt. No interventions as of yet.   Review Of Systems Outlined In HPI  Past Medical History  Diagnosis Date  . Barrett's esophagus 09/30/2013  . Bipolar 1 disorder, mixed 09/30/2013    [facility], Dr. Ardine Eng   . BPH (benign prostatic hyperplasia) 09/30/2013  . Essential hypertension, benign 09/30/2013    Past Surgical History  Procedure Laterality Date  . Colostomy reversal    . Large bowel perforation     No family history on file.  History   Social History  . Marital Status: Married    Spouse Name: N/A  . Number of Children: N/A  . Years of Education: N/A   Occupational History  . Not on file.   Social History Main Topics  . Smoking status: Former Smoker    Quit date: 09/30/1980  . Smokeless tobacco: Not on file  . Alcohol Use: 7.0 oz/week    14 drink(s) per week  . Drug Use: No  . Sexual Activity:    Partners: Female   Other Topics Concern  . Not on file   Social History Narrative     Objective: BP 136/75 mmHg  Pulse 48  Wt  214 lb (97.07 kg)  Vital signs reviewed. General: Alert and Oriented, No Acute Distress HEENT: Pupils equal, round, reactive to light. Conjunctivae clear.  External ears unremarkable.  Moist mucous membranes. Lungs: Clear and comfortable work of breathing, speaking in full sentences without accessory muscle use. Cardiac: Regular rate and rhythm.  Neuro: CN II-XII grossly intact, gait normal. Extremities: No peripheral edema.  Strong peripheral pulses.full range of motion and strength in the left thumb without any swelling redness or warmth  Mental Status: No depression, anxiety, nor agitation. Logical though process. Skin: Warm and dry. 1 centimeterdiameter fleshy coloredand waxy  Growth on the left flank.  Assessment & Plan: Alexander [name] was seen today for lesion on the back and sleep study.  Diagnoses and all orders for this visit:  Thumb pain, left Orders: -     DG Finger Thumb Left; Future  OSA (obstructive sleep apnea) Orders: -     Split night study; Future  Inflamed skin tag   Left thumb pain: Obtain x-ray to evaluate for osteoarthritis Obstruction sleep apnea: Uncontrolled and worsening due to worn-out equipment. His last sleep study was over 12 years ago therefore repeating sleep study to help insurance coverage with new equipment. Inflamed skin tag: Treated with cryotherapy if ineffective after 1 week of waiting return for  surgical excision.  No Follow-up on file.  Cryotherapy Procedure Note  Pre-operative Diagnosis: inflammed skin tag  Post-operative Diagnosis: same  Locations: left flank  Indications: pain  Anesthesia: none  Procedure Details  History of allergy to iodine: no. Pacemaker? no.  Patient informed of risks (permanent scarring, infection, light or dark discoloration, bleeding, infection, weakness, numbness and recurrence of the lesion) and benefits of the procedure and verbal informed consent obtained.  The areas are treated with liquid nitrogen  therapy, frozen until ice ball extended 3 mm beyond lesion, allowed to thaw, and treated again. The patient tolerated procedure well.  The patient was instructed on post-op care, warned that there may be blister formation, redness and pain. Recommend OTC analgesia as needed for pain.  Condition: Stable  Complications: none.  Plan: 1. Instructed to keep the area dry and covered for 24-48h and clean thereafter. 2. Warning signs of infection were reviewed.   3. Recommended that the patient use OTC analgesics as needed for pain.  4. Return in 1 week.

## 2015-01-05 ENCOUNTER — Encounter: Payer: Self-pay | Admitting: Sports Medicine

## 2015-01-05 ENCOUNTER — Ambulatory Visit (INDEPENDENT_AMBULATORY_CARE_PROVIDER_SITE_OTHER): Payer: BLUE CROSS/BLUE SHIELD | Admitting: Sports Medicine

## 2015-01-05 VITALS — BP 134/75 | HR 52 | Ht 70.0 in | Wt 213.0 lb

## 2015-01-05 DIAGNOSIS — M1812 Unilateral primary osteoarthritis of first carpometacarpal joint, left hand: Secondary | ICD-10-CM | POA: Diagnosis not present

## 2015-01-05 HISTORY — DX: Unilateral primary osteoarthritis of first carpometacarpal joint, left hand: M18.12

## 2015-01-05 NOTE — Assessment & Plan Note (Signed)
Trapeziometacarpal joint injection as above, thumb spica brace. Return in one month. He also has some carpal tunnel type symptoms, the spica brace will prevent prolonged flexion at night, and we can do a median nerve hydrodissection if no better in a month.

## 2015-01-05 NOTE — Progress Notes (Signed)
   Subjective:    I'm seeing this patient as a consultation for:  Dr. Ileene Rubens  CC: left hand pain  HPI: This is a pleasant 64 year old male, he comes in with a long history of pain that he localizes at the trapeziometacarpal joint of his left wrist, he also gets occasional electric and shooting-type sensations into his thumb. This is usually while working, and can cause him to drop things. At rest he has no numbness, tingling, or pain. Symptoms are moderate, persistent without radiation and he does desire some form of interventional treatment today.  Past medical history, Surgical history, Family history not pertinant except as noted below, Social history, Allergies, and medications have been entered into the medical record, reviewed, and no changes needed.   Review of Systems: No headache, visual changes, nausea, vomiting, diarrhea, constipation, dizziness, abdominal pain, skin rash, fevers, chills, night sweats, weight loss, swollen lymph nodes, body aches, joint swelling, muscle aches, chest pain, shortness of breath, mood changes, visual or auditory hallucinations.   Objective:   General: Well Developed, well nourished, and in no acute distress.  Neuro/Psych: Alert and oriented x3, extra-ocular muscles intact, able to move all 4 extremities, sensation grossly intact. Skin: Warm and dry, no rashes noted.  Respiratory: Not using accessory muscles, speaking in full sentences, trachea midline.  Cardiovascular: Pulses palpable, no extremity edema. Abdomen: Does not appear distended. Left Wrist: Inspection normal with no visible erythema or swelling. ROM smooth and normal with good flexion and extension and ulnar/radial deviation that is symmetrical with opposite wrist. Tender to palpation at the trapeziometacarpal joint No snuffbox tenderness. No tenderness over Canal of [place]. Strength 5/5 in all directions without pain. Negative Finkelstein, tinel's and phalens. Negative Watson's  test.  Procedure: Real-time Ultrasound Guided Injection of left trapeziometacarpal joint Device: GE Logiq E  Verbal informed consent obtained.  Time-out conducted.  Noted no overlying erythema, induration, or other signs of local infection.  Skin prepped in a sterile fashion.  Local anesthesia: Topical Ethyl chloride.  With sterile technique and under real time ultrasound guidance:  25-gauge needle advanced into a clearly arthritic first carpal metacarpal joint, 0.5 mL kenalog 40, 0.5 mL lidocaine injected easily. Completed without difficulty  Pain immediately resolved suggesting accurate placement of the medication.  Advised to call if fevers/chills, erythema, induration, drainage, or persistent bleeding.  Images permanently stored and available for review in the ultrasound unit.  Impression: Technically successful ultrasound guided injection.  Impression and Recommendations:   This case required medical decision making of moderate complexity.

## 2015-02-02 ENCOUNTER — Ambulatory Visit: Payer: BLUE CROSS/BLUE SHIELD | Admitting: Sports Medicine

## 2015-02-11 ENCOUNTER — Other Ambulatory Visit: Payer: Self-pay | Admitting: Family Medicine

## 2015-02-12 ENCOUNTER — Other Ambulatory Visit: Payer: Self-pay | Admitting: *Deleted

## 2015-02-28 ENCOUNTER — Ambulatory Visit (INDEPENDENT_AMBULATORY_CARE_PROVIDER_SITE_OTHER): Payer: BLUE CROSS/BLUE SHIELD | Admitting: Family Medicine

## 2015-02-28 ENCOUNTER — Encounter: Payer: Self-pay | Admitting: Family Medicine

## 2015-02-28 VITALS — BP 122/70 | HR 53 | Ht 70.0 in | Wt 212.0 lb

## 2015-02-28 DIAGNOSIS — Z0000 Encounter for general adult medical examination without abnormal findings: Secondary | ICD-10-CM | POA: Diagnosis not present

## 2015-02-28 DIAGNOSIS — Z23 Encounter for immunization: Secondary | ICD-10-CM | POA: Diagnosis not present

## 2015-02-28 LAB — LIPID PANEL
CHOLESTEROL: 173 mg/dL (ref 125–200)
HDL: 52 mg/dL (ref >=40)
LDL CALC: 65 mg/dL (ref <130)
Total CHOL/HDL Ratio: 3.3 Ratio (ref <=5.0)
Triglycerides: 278 mg/dL — ABNORMAL HIGH (ref <150)
VLDL: 56 mg/dL — ABNORMAL HIGH (ref <30)

## 2015-02-28 LAB — CBC
HCT: 41.8 % (ref 39.0–52.0)
Hemoglobin: 14.6 g/dL (ref 13.0–17.0)
MCH: 33 pg (ref 26.0–34.0)
MCHC: 34.9 g/dL (ref 30.0–36.0)
MCV: 94.4 fL (ref 78.0–100.0)
MPV: 9.9 fL (ref 8.6–12.4)
PLATELETS: 214 10*3/uL (ref 150–400)
RBC: 4.43 MIL/uL (ref 4.22–5.81)
RDW: 14.1 % (ref 11.5–15.5)
WBC: 8.5 10*3/uL (ref 4.0–10.5)

## 2015-02-28 LAB — COMPLETE METABOLIC PANEL WITH GFR
ALT: 32 U/L (ref 9–46)
AST: 25 U/L (ref 10–35)
Albumin: 4.6 g/dL (ref 3.6–5.1)
Alkaline Phosphatase: 73 U/L (ref 40–115)
BUN: 13 mg/dL (ref 7–25)
CHLORIDE: 104 meq/L (ref 98–110)
CO2: 24 mEq/L (ref 20–31)
Calcium: 10 mg/dL (ref 8.6–10.3)
Creat: 0.84 mg/dL (ref 0.70–1.25)
Glucose, Bld: 103 mg/dL — ABNORMAL HIGH (ref 65–99)
POTASSIUM: 4.1 meq/L (ref 3.5–5.3)
Sodium: 139 mEq/L (ref 135–146)
Total Bilirubin: 1.3 mg/dL — ABNORMAL HIGH (ref 0.2–1.2)
Total Protein: 7.2 g/dL (ref 6.1–8.1)

## 2015-02-28 MED ORDER — SULFAMETHOXAZOLE-TRIMETHOPRIM 800-160 MG PO TABS: 1.0000 | ORAL_TABLET | Freq: Two times a day (BID) | ORAL | Status: DC

## 2015-02-28 MED ORDER — ZOSTER VACCINE LIVE 19400 UNT/0.65ML ~~LOC~~ SOLR: 0.6500 mL | Freq: Once | SUBCUTANEOUS | Status: DC

## 2015-02-28 NOTE — Progress Notes (Signed)
CC: Alexander [name] is a 64 y.o. male is here for Annual Exam   Subjective: HPI:  Colonoscopy: 2014 UTD, repeat 2019 Prostate: Discussed screening risks/beneifts with patient today, 0.5 in 2012   Influenza Vaccine: not indicated Pneumovax: UTD Td/Tdap: needs booster today Zoster: Rx provided   Here for CPE complaining only of a pimple on the back for the past four days, enlarging.  Review of Systems - General ROS: negative for - chills, fever, night sweats, weight gain or weight loss Ophthalmic ROS: negative for - decreased vision Psychological ROS: negative for - anxiety or depression ENT ROS: negative for - hearing change, nasal congestion, tinnitus or allergies Hematological and Lymphatic ROS: negative for - bleeding problems, bruising or swollen lymph nodes Breast ROS: negative Respiratory ROS: no cough, shortness of breath, or wheezing Cardiovascular ROS: no chest pain or dyspnea on exertion Gastrointestinal ROS: no abdominal pain, change in bowel habits, or black or bloody stools Genito-Urinary ROS: negative for - genital discharge, genital ulcers, incontinence or abnormal bleeding from genitals Musculoskeletal ROS: negative for - joint pain or muscle pain Neurological ROS: negative for - headaches or memory loss Dermatological ROS: negative for lumps, mole changes, rash and skin lesion changes other than that described above  Past Medical History  Diagnosis Date  . Barrett's esophagus 09/30/2013  . Bipolar 1 disorder, mixed 09/30/2013    [facility], Dr. Ardine Eng   . BPH (benign prostatic hyperplasia) 09/30/2013  . Essential hypertension, benign 09/30/2013    Past Surgical History  Procedure Laterality Date  . Colostomy reversal    . Large bowel perforation     No family history on file.  History   Social History  . Marital Status: Married    Spouse Name: N/A  . Number of Children: N/A  . Years of Education: N/A   Occupational History  . Not on file.    Social History Main Topics  . Smoking status: Former Smoker    Quit date: 09/30/1980  . Smokeless tobacco: Not on file  . Alcohol Use: 7.0 oz/week    14 drink(s) per week  . Drug Use: No  . Sexual Activity:    Partners: Female   Other Topics Concern  . Not on file   Social History Narrative     Objective: BP 122/70 mmHg  Pulse 53  Ht 5\' 10"  (1.778 m)  Wt 212 lb (96.163 kg)  BMI 30.42 kg/m2  General: No Acute Distress HEENT: Atraumatic, normocephalic, conjunctivae normal without scleral icterus.  No nasal discharge, hearing grossly intact, TMs with good landmarks bilaterally with no middle ear abnormalities, posterior pharynx clear without oral lesions. Neck: Supple, trachea midline, no cervical nor supraclavicular adenopathy. Pulmonary: Clear to auscultation bilaterally without wheezing, rhonchi, nor rales. Cardiac: Regular rate and rhythm.  No murmurs, rubs, nor gallops. No peripheral edema.  2+ peripheral pulses bilaterally. Abdomen: Bowel sounds normal.  No masses.  Non-tender without rebound.  Negative Murphy's sign. MSK: Grossly intact, no signs of weakness.  Full strength throughout upper and lower extremities.  Full ROM in upper and lower extremities.  No midline spinal tenderness. Neuro: Gait unremarkable, CN II-XII grossly intact.  C5-C6 Reflex 2/4 Bilaterally, L4 Reflex 2/4 Bilaterally.  Cerebellar function intact. Skin: No rashes. Small boil on the back overlying left scapula, when viewed with bedside ultrasound no signs of abscess or pus. Psych: Alert and oriented to person/place/time.  Thought process normal. No anxiety/depression.  Assessment & Plan: Alexander [name] was seen today for annual exam.  Diagnoses and all orders for this visit:  Annual physical exam Orders: -     CBC -     COMPLETE METABOLIC PANEL WITH GFR -     Lipid panel -     PSA -     zoster vaccine live, PF, (ZOSTAVAX) 29518 UNT/0.65ML injection; Inject 19,400 Units into the skin once.  Other  orders -     sulfamethoxazole-trimethoprim (BACTRIM DS,SEPTRA DS) 800-160 MG per tablet; Take 1 tablet by mouth 2 (two) times daily.     Return in about 3 months (around 05/31/2015).

## 2015-03-01 ENCOUNTER — Telehealth: Payer: Self-pay | Admitting: Family Medicine

## 2015-03-01 DIAGNOSIS — R7301 Impaired fasting glucose: Secondary | ICD-10-CM

## 2015-03-01 LAB — PSA: PSA: 1.75 ng/mL (ref <=4.00)

## 2015-03-01 NOTE — Telephone Encounter (Signed)
Alexander [name], Will you please let patient know that his blood cell counts, kidney function, liver function, PSA prostate test and LDL cholesterol were all normal.  His blood sugar was mildly elevated and I'd recommend he have a three month average blood sugar test checked.  A1c order in your inbox if the lab can not add this on.  Triglycerides are mildly elevated and I'd recommend he start a OTC 1g fish oil capsule twice a day with meals.

## 2015-03-01 NOTE — Telephone Encounter (Signed)
Left message on vm with results  

## 2015-03-07 ENCOUNTER — Ambulatory Visit (HOSPITAL_BASED_OUTPATIENT_CLINIC_OR_DEPARTMENT_OTHER): Payer: BLUE CROSS/BLUE SHIELD

## 2015-03-14 ENCOUNTER — Ambulatory Visit (INDEPENDENT_AMBULATORY_CARE_PROVIDER_SITE_OTHER): Payer: Medicare Other | Admitting: Family Medicine

## 2015-03-14 ENCOUNTER — Encounter: Payer: Self-pay | Admitting: Family Medicine

## 2015-03-14 VITALS — BP 118/56 | HR 71 | Temp 98.2°F | Ht 70.0 in | Wt 213.0 lb

## 2015-03-14 DIAGNOSIS — J209 Acute bronchitis, unspecified: Secondary | ICD-10-CM | POA: Diagnosis not present

## 2015-03-14 MED ORDER — HYDROCODONE-HOMATROPINE 5-1.5 MG/5ML PO SYRP: 5.0000 mL | ORAL_SOLUTION | Freq: Three times a day (TID) | ORAL | Status: DC | PRN

## 2015-03-14 MED ORDER — AZITHROMYCIN 250 MG PO TABS: ORAL_TABLET | ORAL | Status: AC

## 2015-03-14 NOTE — Progress Notes (Signed)
CC: Alexander [name] is a 64 y.o. male is here for Sore Throat and Cough   Subjective: HPI:  Complains of cough that is productive present for the last 5 days accompanied by sore throat. Accompanied by fatigue. Symptoms have been worsening on a daily basis. Significantly interfering with sleep due to frequent cough. He denies shortness of breath or chest pain. Denies rash, headache, nasal congestion or facial pain. Denies any blood in sputum   Review Of Systems Outlined In HPI  Past Medical History  Diagnosis Date  . Barrett's esophagus 09/30/2013  . Bipolar 1 disorder, mixed 09/30/2013    [facility], Dr. Ardine Eng   . BPH (benign prostatic hyperplasia) 09/30/2013  . Essential hypertension, benign 09/30/2013    Past Surgical History  Procedure Laterality Date  . Colostomy reversal    . Large bowel perforation     No family history on file.  Social History   Social History  . Marital Status: Married    Spouse Name: N/A  . Number of Children: N/A  . Years of Education: N/A   Occupational History  . Not on file.   Social History Main Topics  . Smoking status: Former Smoker    Quit date: 09/30/1980  . Smokeless tobacco: Not on file  . Alcohol Use: 7.0 oz/week    14 drink(s) per week  . Drug Use: No  . Sexual Activity:    Partners: Female   Other Topics Concern  . Not on file   Social History Narrative     Objective: BP 118/56 mmHg  Pulse 71  Temp(Src) 98.2 F (36.8 C) (Oral)  Ht 5\' 10"  (1.778 m)  Wt 213 lb (96.616 kg)  BMI 30.56 kg/m2  SpO2 98%  General: Alert and Oriented, No Acute Distress HEENT: Pupils equal, round, reactive to light. Conjunctivae clear.  External ears unremarkable, canals clear with intact TMs with appropriate landmarks.  Middle ear appears open without effusion. Pink inferior turbinates.  Moist mucous membranes, pharynx without inflammation nor lesions.  Neck supple without palpable lymphadenopathy nor abnormal masses. Lungs: Clear to  auscultation bilaterally, no wheezing/ronchi/rales.  Comfortable work of breathing. Good air movement. Frequent coughing Cardiac: Regular rate and rhythm. Normal S1/S2.  No murmurs, rubs, nor gallops.   Extremities: No peripheral edema.  Strong peripheral pulses.  Mental Status: No depression, anxiety, nor agitation. Skin: Warm and dry.  Assessment & Plan: Wylder Macomber was seen today for sore throat and cough.  Diagnoses and all orders for this visit:  Acute bronchitis, unspecified organism -     azithromycin (ZITHROMAX) 250 MG tablet; Take two tabs at once on day 1, then one tab daily on days 2-5. -     HYDROcodone-homatropine (HYCODAN) 5-1.5 MG/5ML syrup; Take 5 mLs by mouth every 8 (eight) hours as needed for cough.   Bronchitis: Start azithromycin and as needed Hycodan. If no better by Friday next step would be to add on prednisone burst.   Return if symptoms worsen or fail to improve.

## 2015-03-17 ENCOUNTER — Other Ambulatory Visit: Payer: Self-pay | Admitting: Family Medicine

## 2015-03-20 ENCOUNTER — Encounter: Payer: Self-pay | Admitting: Family Medicine

## 2015-03-20 ENCOUNTER — Ambulatory Visit (INDEPENDENT_AMBULATORY_CARE_PROVIDER_SITE_OTHER): Payer: Medicare Other

## 2015-03-20 ENCOUNTER — Ambulatory Visit (INDEPENDENT_AMBULATORY_CARE_PROVIDER_SITE_OTHER): Payer: Medicare Other | Admitting: Family Medicine

## 2015-03-20 VITALS — BP 128/75 | HR 62 | Temp 97.8°F | Wt 208.0 lb

## 2015-03-20 DIAGNOSIS — R05 Cough: Secondary | ICD-10-CM

## 2015-03-20 DIAGNOSIS — R059 Cough, unspecified: Secondary | ICD-10-CM

## 2015-03-20 MED ORDER — BENZONATATE 200 MG PO CAPS: 200.0000 mg | ORAL_CAPSULE | Freq: Two times a day (BID) | ORAL | Status: DC | PRN

## 2015-03-20 MED ORDER — LEVOFLOXACIN 500 MG PO TABS: 500.0000 mg | ORAL_TABLET | Freq: Every day | ORAL | Status: DC

## 2015-03-20 NOTE — Progress Notes (Signed)
CC: Alexander [name] is a 64 y.o. male is here for Cough   Subjective: HPI:  Continued cough that has not gone any better since I saw him last. He tells me that after he left our office he started to get nauseous and has been throwing up frequently. He tells me he is throwing up within 10 or 15 minutes after taking the antibiotic, azithromycin. The vomiting and nausea has drastically improved since using Zofran over the weekend. He is back to having his normal appetite and eating without any difficulty for nausea. He still has a productive cough that is interfering with sleep and was unresponsive to Hycodan. He denies blood in sputum. Nothing new has seemed to make symptoms better or worse. He feels fatigued but denies any fevers or chills. He denies any chest pain. Denies any pain with breathing.  Symptoms are still moderate in severity. He denies any constipation or diarrhea. Frequent coughing   Review Of Systems Outlined In HPI  Past Medical History  Diagnosis Date  . Barrett's esophagus 09/30/2013  . Bipolar 1 disorder, mixed 09/30/2013    [facility], Dr. Ardine Eng   . BPH (benign prostatic hyperplasia) 09/30/2013  . Essential hypertension, benign 09/30/2013    Past Surgical History  Procedure Laterality Date  . Colostomy reversal    . Large bowel perforation     No family history on file.  Social History   Social History  . Marital Status: Married    Spouse Name: N/A  . Number of Children: N/A  . Years of Education: N/A   Occupational History  . Not on file.   Social History Main Topics  . Smoking status: Former Smoker    Quit date: 09/30/1980  . Smokeless tobacco: Not on file  . Alcohol Use: 7.0 oz/week    14 drink(s) per week  . Drug Use: No  . Sexual Activity:    Partners: Female   Other Topics Concern  . Not on file   Social History Narrative     Objective: BP 128/75 mmHg  Pulse 62  Temp(Src) 97.8 F (36.6 C) (Oral)  Wt 208 lb (94.348 kg)  SpO2  98%  General: Alert and Oriented, No Acute Distress HEENT: Pupils equal, round, reactive to light. Conjunctivae clear.  External ears unremarkable, canals clear with intact TMs with appropriate landmarks.  Middle ear appears open without effusion. Pink inferior turbinates.  Moist mucous membranes, pharynx without inflammation nor lesions.  Neck supple without palpable lymphadenopathy nor abnormal masses. Lungs: Clear to auscultation bilaterally, no wheezing/ronchi/rales.  Comfortable work of breathing. Good air movement. Frequent coughing Cardiac: Regular rate and rhythm. Normal S1/S2.  No murmurs, rubs, nor gallops.   Mental Status: No depression, anxiety, nor agitation. Skin: Warm and dry.  Assessment & Plan: Alexander [name] was seen today for cough.  Diagnoses and all orders for this visit:  Cough -     DG Chest 2 View; Future  Other orders -     levofloxacin (LEVAQUIN) 500 MG tablet; Take 1 tablet (500 mg total) by mouth daily. -     benzonatate (TESSALON) 200 MG capsule; Take 1 capsule (200 mg total) by mouth 2 (two) times daily as needed for cough.   Given persistence of cough and x-ray was obtained to rule out pneumonia, fortunately there was no signs of any infiltrate. We'll start levofloxacin since it's uncertain how much azithromycin was actually getting into his bloodstream. Holding off on prednisone at this time since I suspect he  probably wasn't getting much azithromycin into his system before vomiting. Next step would be to Add prednisone on Thursday if no better.  Return if symptoms worsen or fail to improve.

## 2015-03-22 ENCOUNTER — Ambulatory Visit (HOSPITAL_BASED_OUTPATIENT_CLINIC_OR_DEPARTMENT_OTHER): Payer: BLUE CROSS/BLUE SHIELD

## 2015-03-27 ENCOUNTER — Encounter: Payer: BLUE CROSS/BLUE SHIELD | Admitting: Family Medicine

## 2015-04-18 ENCOUNTER — Telehealth: Payer: Self-pay | Admitting: Family Medicine

## 2015-04-18 MED ORDER — PREDNISONE 20 MG PO TABS: ORAL_TABLET | ORAL | Status: AC

## 2015-04-18 NOTE — Telephone Encounter (Signed)
Lingering cough

## 2015-04-25 ENCOUNTER — Other Ambulatory Visit: Payer: Self-pay | Admitting: Family Medicine

## 2015-05-10 ENCOUNTER — Ambulatory Visit (HOSPITAL_BASED_OUTPATIENT_CLINIC_OR_DEPARTMENT_OTHER): Payer: Medicare Other | Attending: Family Medicine | Admitting: *Deleted

## 2015-05-10 DIAGNOSIS — I493 Ventricular premature depolarization: Secondary | ICD-10-CM | POA: Insufficient documentation

## 2015-05-10 DIAGNOSIS — R001 Bradycardia, unspecified: Secondary | ICD-10-CM | POA: Insufficient documentation

## 2015-05-10 DIAGNOSIS — G4733 Obstructive sleep apnea (adult) (pediatric): Secondary | ICD-10-CM | POA: Diagnosis not present

## 2015-05-10 DIAGNOSIS — R0683 Snoring: Secondary | ICD-10-CM | POA: Insufficient documentation

## 2015-05-12 DIAGNOSIS — G4733 Obstructive sleep apnea (adult) (pediatric): Secondary | ICD-10-CM | POA: Diagnosis not present

## 2015-05-12 NOTE — Progress Notes (Signed)
   Patient Name: Clennon, Nasca Date: 05/10/2015 Gender: Male D.O.B: Sep 12, 1950 Age (years): 70 Referring Provider: Marcial Pacas Height (inches): 70 Interpreting Physician: Baird Lyons MD, ABSM Weight (lbs): 214 RPSGT: Gerhard Perches BMI: 31 MRN: [id number] Neck Size: 16.50 CLINICAL INFORMATION Sleep Study Type: NPSG  Indication for sleep study: OSA  Epworth Sleepiness Score: 12  SLEEP STUDY TECHNIQUE As per the AASM Manual for the Scoring of Sleep and Associated Events v2.3 (April 2016) with a hypopnea requiring 4% desaturations.  The channels recorded and monitored were frontal, central and occipital EEG, electrooculogram (EOG), submentalis EMG (chin), nasal and oral airflow, thoracic and abdominal wall motion, anterior tibialis EMG, snore microphone, electrocardiogram, and pulse oximetry.  MEDICATIONS Patient's medications include: charted for review. Medications self-administered by patient during sleep study : No sleep medicine administered.  SLEEP ARCHITECTURE The study was initiated at 9:54:15 PM and ended at 4:31:34 AM.  Sleep onset time was 58.9 minutes and the sleep efficiency was 68.4%. The total sleep time was 271.9 minutes.  Stage REM latency was 249.5 minutes.  The patient spent 17.65% of the night in stage N1 sleep, 68.56% in stage N2 sleep, 0.00% in stage N3 and 13.79% in REM.  Alpha intrusion was absent.  Supine sleep was 23.39%.  Wake after sleep onset 66.5 minutes  RESPIRATORY PARAMETERS The overall apnea/hypopnea index (AHI) was 3.5 per hour. There were 11 total apneas, including 11 obstructive, 0 central and 0 mixed apneas. There were 5 hypopneas and 17 RERAs.  The AHI during Stage REM sleep was 25.6 per hour.  AHI while supine was 15.1 per hour.  The mean oxygen saturation was 92.41%. The minimum SpO2 during sleep was 86.00%.  Moderate snoring was noted during this study.  CARDIAC DATA The 2 lead EKG demonstrated sinus  bradycardia rhythm. The mean heart rate was 45.11 beats per minute. Other EKG findings include: PVCs.  LEG MOVEMENT DATA The total PLMS were 53 with a resulting PLMS index of 11.70. Associated arousal with leg movement index was 1.5 .  IMPRESSIONS No significant obstructive sleep apnea occurred during this study (AHI = 3.5/h). No significant central sleep apnea occurred during this study (CAI = 0.0/h). Mild oxygen desaturation was noted during this study (Min O2 = 86.00%). The patient snored with Moderate snoring volume. EKG findings include PVCs and sinus bradycardia. Mild periodic limb movements of sleep occurred during the study. No significant associated arousals.   DIAGNOSIS Primary Snoring (786.09 [R06.83 ICD-10])  RECOMMENDATIONS Positional therapy avoiding supine position during sleep. Avoid alcohol, sedatives and other CNS depressants that may worsen sleep apnea and disrupt normal sleep architecture. Sleep hygiene should be reviewed to assess factors that may improve sleep quality. Weight management and regular exercise should be initiated or continued if appropriate.  Deneise Lever Diplomate, American Board of Sleep Medicine  ELECTRONICALLY SIGNED ON:  05/12/2015, 1:30 PM [place] PH: [phone]   FX: [phone] [place]

## 2015-05-14 ENCOUNTER — Other Ambulatory Visit: Payer: Self-pay | Admitting: Family Medicine

## 2015-06-03 ENCOUNTER — Encounter (HOSPITAL_BASED_OUTPATIENT_CLINIC_OR_DEPARTMENT_OTHER): Payer: Self-pay

## 2015-06-06 ENCOUNTER — Telehealth: Payer: Self-pay | Admitting: Family Medicine

## 2015-06-06 NOTE — Telephone Encounter (Signed)
Awaiting call back.

## 2015-06-06 NOTE — Telephone Encounter (Signed)
Will you please let patient know that his sleep test did not show any signs of sleep apnea.  Although this is reassuring if he's still having significant fatigue please follow up on an as needed basis.

## 2015-06-08 NOTE — Telephone Encounter (Signed)
Pt advised.

## 2015-06-11 ENCOUNTER — Other Ambulatory Visit: Payer: Self-pay | Admitting: Family Medicine

## 2015-06-27 ENCOUNTER — Other Ambulatory Visit: Payer: Self-pay | Admitting: Family Medicine

## 2015-07-11 ENCOUNTER — Other Ambulatory Visit: Payer: Self-pay | Admitting: Family Medicine

## 2015-07-16 ENCOUNTER — Ambulatory Visit: Payer: Medicare Other | Admitting: Family Medicine

## 2015-07-23 ENCOUNTER — Other Ambulatory Visit: Payer: Self-pay | Admitting: Family Medicine

## 2015-08-01 ENCOUNTER — Other Ambulatory Visit: Payer: Self-pay | Admitting: Family Medicine

## 2015-08-07 ENCOUNTER — Encounter: Payer: Self-pay | Admitting: Family Medicine

## 2015-08-07 ENCOUNTER — Ambulatory Visit (INDEPENDENT_AMBULATORY_CARE_PROVIDER_SITE_OTHER): Payer: Medicare Other | Admitting: Family Medicine

## 2015-08-07 VITALS — BP 166/81 | HR 49 | Wt 223.0 lb

## 2015-08-07 DIAGNOSIS — I10 Essential (primary) hypertension: Secondary | ICD-10-CM

## 2015-08-07 DIAGNOSIS — N400 Enlarged prostate without lower urinary tract symptoms: Secondary | ICD-10-CM | POA: Diagnosis not present

## 2015-08-07 DIAGNOSIS — R7301 Impaired fasting glucose: Secondary | ICD-10-CM

## 2015-08-07 DIAGNOSIS — Z79899 Other long term (current) drug therapy: Secondary | ICD-10-CM | POA: Diagnosis not present

## 2015-08-07 LAB — POCT GLYCOSYLATED HEMOGLOBIN (HGB A1C): HEMOGLOBIN A1C: 5.6

## 2015-08-07 MED ORDER — BENAZEPRIL HCL 20 MG PO TABS: 20.0000 mg | ORAL_TABLET | Freq: Every day | ORAL | Status: DC

## 2015-08-07 MED ORDER — AMLODIPINE BESYLATE 10 MG PO TABS: ORAL_TABLET | ORAL | Status: DC

## 2015-08-07 MED ORDER — DOXAZOSIN MESYLATE 4 MG PO TABS: ORAL_TABLET | ORAL | Status: DC

## 2015-08-07 MED ORDER — AMBULATORY NON FORMULARY MEDICATION: Status: DC

## 2015-08-07 NOTE — Progress Notes (Signed)
CC: Alexander [name] is a 65 y.o. male is here for Hyperglycemia and Hypertension   Subjective: HPI:  He's been having some problems with his CPAP machine. Some of the tubing is cracking. The facemask does not seem to fit on his face as well as it used to. He is having daytime sleepiness and nonrestorative sleep on a daily basis. Symptoms are mild to moderate in severity.  Follow-up impaired fasting glucose: He's been trying to keep an eye on reducing his carbohydrate intake. He walks about 2 miles a day for exercise. Denies polyuria polyphagia or polydipsia.  Follow-up essential hypertension: He's run out of Cardura. No outside blood pressures report. No chest pain shortness of breath orthopnea nor peripheral edema. He's been able to continue to take amlodipine and benazepril.   Review Of Systems Outlined In HPI  Past Medical History  Diagnosis Date  . Barrett's esophagus 09/30/2013  . Bipolar 1 disorder, mixed ([place]) 09/30/2013    [facility], Dr. Ardine Eng   . BPH (benign prostatic hyperplasia) 09/30/2013  . Essential hypertension, benign 09/30/2013    Past Surgical History  Procedure Laterality Date  . Colostomy reversal    . Large bowel perforation     No family history on file.  Social History   Social History  . Marital Status: Married    Spouse Name: N/A  . Number of Children: N/A  . Years of Education: N/A   Occupational History  . Not on file.   Social History Main Topics  . Smoking status: Former Smoker    Quit date: 09/30/1980  . Smokeless tobacco: Not on file  . Alcohol Use: 7.0 oz/week    14 drink(s) per week  . Drug Use: No  . Sexual Activity:    Partners: Female   Other Topics Concern  . Not on file   Social History Narrative     Objective: BP 166/81 mmHg  Pulse 49  Wt 223 lb (101.152 kg)  General: Alert and Oriented, No Acute Distress HEENT: Pupils equal, round, reactive to light. Conjunctivae clear.   Lungs: Clear to auscultation  bilaterally, no wheezing/ronchi/rales.  Comfortable work of breathing. Good air movement. Cardiac: Regular rate and rhythm. Normal S1/S2.  No murmurs, rubs, nor gallops.   Extremities: No peripheral edema.  Strong peripheral pulses.  Mental Status: No depression, anxiety, nor agitation. Skin: Warm and dry.  Assessment & Plan: Alexander [name] was seen today for hyperglycemia and hypertension.  Diagnoses and all orders for this visit:  Essential hypertension, benign  Impaired fasting glucose -     POCT HgB A1C  BPH (benign prostatic hyperplasia)  Other orders -     doxazosin (CARDURA) 4 MG tablet; TAKE 1 TABLET (4 MG TOTAL) BY MOUTH 2 (TWO) TIMES DAILY. -     Discontinue: benazepril (LOTENSIN) 20 MG tablet; Take 1 tablet (20 mg total) by mouth daily. APPOINTMENT NEEDED FOR FURTHER REFILS -     AMBULATORY NON FORMULARY MEDICATION; Auto-titrating CPAP machine with heated humidifier and supplies (4-20cm H2O).  Dx: Obstructive sleep apnea. -     benazepril (LOTENSIN) 20 MG tablet; Take 1 tablet (20 mg total) by mouth daily. -     amLODipine (NORVASC) 10 MG tablet; TAKE 1 TABLET (10 MG TOTAL) BY MOUTH DAILY.   Essential hypertension: Controlled without doxazosin therefore restarting former regimen along with current use of benazepril and amlodipine Impaired fasting glucose: A1c is borderline normal/elevated. Continue diet and exercise interventions no need for new medications.  I will  try to get in touch with [facility] to get a new CPAP supplies. BPH: asymptomatica restart Cardura primary for blood pressure control  Return in about 3 months (around 11/05/2015) for Sugar Recheck.

## 2015-09-18 DIAGNOSIS — G4733 Obstructive sleep apnea (adult) (pediatric): Secondary | ICD-10-CM | POA: Diagnosis not present

## 2015-09-27 ENCOUNTER — Other Ambulatory Visit: Payer: Self-pay | Admitting: Family Medicine

## 2015-10-16 DIAGNOSIS — G4733 Obstructive sleep apnea (adult) (pediatric): Secondary | ICD-10-CM | POA: Diagnosis not present

## 2015-11-06 ENCOUNTER — Other Ambulatory Visit: Payer: Self-pay | Admitting: Family Medicine

## 2015-11-07 ENCOUNTER — Telehealth: Payer: Self-pay

## 2015-11-07 NOTE — Telephone Encounter (Signed)
Pt needs an Rx for cpap adjustments.

## 2015-11-07 NOTE — Telephone Encounter (Signed)
We can help with this but I need more information about what specific adjustments he's looking for and who he most recently used as a respiratory supply company.

## 2015-11-07 NOTE — Telephone Encounter (Signed)
He pt stated that he feels he's not getting enough air while sleeping.  He wants to know could the air supply be turned up. Please advise.

## 2015-11-07 NOTE — Telephone Encounter (Signed)
Yes that's a possibility however does he know if his unit has an "automatic" feature that titrate's the pressure automatically? If not, does he know how to manually adjust the pressure himself, also who he most recently used as a respiratory supply company

## 2015-11-08 NOTE — Telephone Encounter (Signed)
Awaiting call back.

## 2015-11-08 NOTE — Telephone Encounter (Signed)
Pt reports that his cpap machine says automatic and he did try to reset it himself and didn't work.  Pt uses [facility].

## 2015-11-09 NOTE — Telephone Encounter (Signed)
Can you please ask [facility] to visit his residence to check on the machine due to concerns that it might not be working properly.

## 2015-11-09 NOTE — Telephone Encounter (Signed)
[facility] notified

## 2015-11-16 DIAGNOSIS — G4733 Obstructive sleep apnea (adult) (pediatric): Secondary | ICD-10-CM | POA: Diagnosis not present

## 2015-11-24 ENCOUNTER — Other Ambulatory Visit: Payer: Self-pay | Admitting: Family Medicine

## 2015-12-04 ENCOUNTER — Other Ambulatory Visit: Payer: Self-pay

## 2015-12-04 MED ORDER — DOXAZOSIN MESYLATE 4 MG PO TABS: ORAL_TABLET | ORAL | Status: DC

## 2015-12-05 ENCOUNTER — Telehealth: Payer: Self-pay

## 2015-12-05 MED ORDER — AMBULATORY NON FORMULARY MEDICATION: Status: DC

## 2015-12-05 NOTE — Telephone Encounter (Signed)
[facility] is asking for a Rx to increase Alexander [name] air flow. Right now it is at a 4 but they think he will benefit if its at a 6. Please advise.

## 2015-12-05 NOTE — Telephone Encounter (Signed)
[place], Rx placed in in-box ready for pickup/faxing.  

## 2015-12-05 NOTE — Telephone Encounter (Signed)
Rx faxed

## 2015-12-16 DIAGNOSIS — G4733 Obstructive sleep apnea (adult) (pediatric): Secondary | ICD-10-CM | POA: Diagnosis not present

## 2015-12-17 ENCOUNTER — Telehealth: Payer: Self-pay | Admitting: Family Medicine

## 2015-12-17 ENCOUNTER — Encounter: Payer: Self-pay | Admitting: Family Medicine

## 2015-12-17 ENCOUNTER — Ambulatory Visit (INDEPENDENT_AMBULATORY_CARE_PROVIDER_SITE_OTHER): Payer: Medicare Other | Admitting: Family Medicine

## 2015-12-17 VITALS — BP 156/75 | HR 44 | Wt 216.0 lb

## 2015-12-17 DIAGNOSIS — I10 Essential (primary) hypertension: Secondary | ICD-10-CM

## 2015-12-17 DIAGNOSIS — G4733 Obstructive sleep apnea (adult) (pediatric): Secondary | ICD-10-CM

## 2015-12-17 DIAGNOSIS — E785 Hyperlipidemia, unspecified: Secondary | ICD-10-CM | POA: Diagnosis not present

## 2015-12-17 DIAGNOSIS — Z9989 Dependence on other enabling machines and devices: Principal | ICD-10-CM

## 2015-12-17 MED ORDER — DOXAZOSIN MESYLATE 4 MG PO TABS: ORAL_TABLET | ORAL | Status: DC

## 2015-12-17 MED ORDER — AMLODIPINE BESYLATE 10 MG PO TABS: ORAL_TABLET | ORAL | Status: DC

## 2015-12-17 MED ORDER — ATORVASTATIN CALCIUM 20 MG PO TABS: ORAL_TABLET | ORAL | Status: DC

## 2015-12-17 NOTE — Telephone Encounter (Signed)
Will you please do me a favor and contact Darnelle Bos at [employer] (cell [phone]) and see if he can clear up some confusion with this patient.  The patient requires a new nasal mask and elastic band, his current model is a Airfit P10.  Also I was hoping that his pressure setting could be switched to Cable.

## 2015-12-17 NOTE — Progress Notes (Signed)
CC: Alexander [name] is a 65 y.o. male is here for cpap questions and Medication Refill   Subjective: HPI:  Follow-up OSA: Currently using Res Med Nasal with Airfit P10 however the strap has broken had to make a makeshift strap on his own. He uses this for at least 4 hours every night, technically he uses it for the entire night sleeping which is usually 7-8 hours. He believes is helping but the makeshift strap doesn't provide a normal fit.  Follow essential hypertension: He is currently taking amlodipine, aspirin, doxazosin on a daily basis. He denies any known side effects. No outside blood pressures report. Denies chest pain shortness of breath orthopnea nor peripheral edema  Follow-up hyperlipidemia: He is requesting a refill on atorvastatin. He denies right upper quadrant pain or myalgias. No limb claudication.     Review Of Systems Outlined In HPI  Past Medical History  Diagnosis Date  . Barrett's esophagus 09/30/2013  . Bipolar 1 disorder, mixed ([place]) 09/30/2013    [facility], Dr. Ardine Eng   . BPH (benign prostatic hyperplasia) 09/30/2013  . Essential hypertension, benign 09/30/2013    Past Surgical History  Procedure Laterality Date  . Colostomy reversal    . Large bowel perforation     No family history on file.  Social History   Social History  . Marital Status: Married    Spouse Name: N/A  . Number of Children: N/A  . Years of Education: N/A   Occupational History  . Not on file.   Social History Main Topics  . Smoking status: Former Smoker    Quit date: 09/30/1980  . Smokeless tobacco: Not on file  . Alcohol Use: 7.0 oz/week    14 drink(s) per week  . Drug Use: No  . Sexual Activity:    Partners: Female   Other Topics Concern  . Not on file   Social History Narrative     Objective: BP 156/75 mmHg  Pulse 44  Wt 216 lb (97.977 kg)  General: Alert and Oriented, No Acute Distress HEENT: Pupils equal, round, reactive to light. Conjunctivae clear.   Moist mucous membranes Lungs: Clear to auscultation bilaterally, no wheezing/ronchi/rales.  Comfortable work of breathing. Good air movement. Cardiac: Regular rate and rhythm. Normal S1/S2.  No murmurs, rubs, nor gallops.   Extremities: No peripheral edema.  Strong peripheral pulses.  Mental Status: No depression, anxiety, nor agitation. Skin: Warm and dry.  Assessment & Plan: Alexander [name] was seen today for cpap questions and medication refill.  Diagnoses and all orders for this visit:  OSA on CPAP  Essential hypertension, benign  Hyperlipidemia  Other orders -     amLODipine (NORVASC) 10 MG tablet; TAKE 1 TABLET (10 MG TOTAL) BY MOUTH DAILY. -     atorvastatin (LIPITOR) 20 MG tablet; TAKE 1 TABLET (20 MG TOTAL) BY MOUTH DAILY. -     doxazosin (CARDURA) 4 MG tablet; TAKE 1 TABLET (4 MG TOTAL) BY MOUTH 2 (TWO) TIMES DAILY.   OSA: Suboptimal control with full equipment, will contact [facility] to send a representative out to his house to fix his mask. Additionally increasing pressure to 6cm of water Hyperlipidemia: Due for lipid panel in July, currently controlled continue atorvastatin Essential hypertension: Uncontrolled, discussed cutting back on salt in the diet and if no better by the next visit will increase doxazosin.  Return in about 3 months (around 03/18/2016) for CPAP.

## 2015-12-18 NOTE — Telephone Encounter (Signed)
Left vm for Jeneen Rinks to return call

## 2015-12-24 ENCOUNTER — Telehealth: Payer: Self-pay | Admitting: Family Medicine

## 2015-12-24 MED ORDER — AMBULATORY NON FORMULARY MEDICATION: Status: DC

## 2015-12-24 NOTE — Telephone Encounter (Signed)
Needs new Rx.

## 2016-01-03 DIAGNOSIS — G4733 Obstructive sleep apnea (adult) (pediatric): Secondary | ICD-10-CM | POA: Diagnosis not present

## 2016-01-12 ENCOUNTER — Other Ambulatory Visit: Payer: Self-pay | Admitting: Family Medicine

## 2016-01-16 DIAGNOSIS — G4733 Obstructive sleep apnea (adult) (pediatric): Secondary | ICD-10-CM | POA: Diagnosis not present

## 2016-02-15 DIAGNOSIS — G4733 Obstructive sleep apnea (adult) (pediatric): Secondary | ICD-10-CM | POA: Diagnosis not present

## 2016-02-19 ENCOUNTER — Encounter: Payer: Self-pay | Admitting: Family Medicine

## 2016-02-19 ENCOUNTER — Ambulatory Visit (INDEPENDENT_AMBULATORY_CARE_PROVIDER_SITE_OTHER): Payer: Medicare Other | Admitting: Family Medicine

## 2016-02-19 VITALS — BP 170/68 | HR 48 | Wt 215.0 lb

## 2016-02-19 DIAGNOSIS — L259 Unspecified contact dermatitis, unspecified cause: Secondary | ICD-10-CM | POA: Diagnosis not present

## 2016-02-19 DIAGNOSIS — H938X2 Other specified disorders of left ear: Secondary | ICD-10-CM | POA: Diagnosis not present

## 2016-02-19 MED ORDER — TRIAMCINOLONE ACETONIDE 0.1 % EX CREA: TOPICAL_CREAM | CUTANEOUS | Status: DC

## 2016-02-19 NOTE — Progress Notes (Signed)
CC: Alexander [name] is a 65 y.o. male is here for Poison Ivy   Subjective: HPI:  1 week ago he began noticing red raised bumps on his shins. It slowly becoming more noticeable and itchy. He noticed some on his forearms yesterday which prompted today's visit. He's been putting anti-itch cream on it but it doesn't seem to be helping. He doesn't have any idea where this came from. He's never had it before. He tells me he feels normal other than this. He denies any fevers, chills or swollen lymph nodes. He denies pain at the sites of redness is only itching   Review Of Systems Outlined In HPI  Past Medical History  Diagnosis Date  . Barrett's esophagus 09/30/2013  . Bipolar 1 disorder, mixed ([place]) 09/30/2013    [facility], Dr. Ardine Eng   . BPH (benign prostatic hyperplasia) 09/30/2013  . Essential hypertension, benign 09/30/2013    Past Surgical History  Procedure Laterality Date  . Colostomy reversal    . Large bowel perforation     No family history on file.  Social History   Social History  . Marital Status: Married    Spouse Name: N/A  . Number of Children: N/A  . Years of Education: N/A   Occupational History  . Not on file.   Social History Main Topics  . Smoking status: Former Smoker    Quit date: 09/30/1980  . Smokeless tobacco: Not on file  . Alcohol Use: 7.0 oz/week    14 drink(s) per week  . Drug Use: No  . Sexual Activity:    Partners: Female   Other Topics Concern  . Not on file   Social History Narrative     Objective: BP 170/68 mmHg  Pulse 48  Wt 215 lb (97.523 kg)  Vital signs reviewed. General: Alert and Oriented, No Acute Distress HEENT: Pupils equal, round, reactive to light. Conjunctivae clear.  External ears unremarkable other than a sebaceous cyst on the rim of the left upper ear.  Moist mucous membranes. Lungs: Clear and comfortable work of breathing, speaking in full sentences without accessory muscle use. Cardiac: Regular rate and  rhythm.  Neuro: CN II-XII grossly intact, gait normal. Extremities: No peripheral edema.  Strong peripheral pulses.  Mental Status: No depression, anxiety, nor agitation. Logical though process. Skin: Warm and dry. Raised erythematous papules and streaks involving the anterior aspect of the ankles  Assessment & Plan: Alexander [name] was seen today for poison ivy.  Diagnoses and all orders for this visit:  Ear mass, left  Contact dermatitis -     triamcinolone cream (KENALOG) 0.1 %; Apply to affected areas twice a day for up to two weeks, avoid face.   Contact dermatitis therefore start triamcinolone cream and use witch hazel to help with itching. He has a sebaceous cyst on the rim of his ear, he wants it removed, I did not have a time to address this today but I did let him know that he can return for dedicated visit for this and be happy to remove it.  Discussed with this patient that I will be resigning from my position here with [facility] in September in order to stay with my family who will be moving to [facility]. I let him know about the providers that are still accepting patients and I feel that this individual will be under great care if he/she stays here with [facility].  Return in about 1 week (around 02/26/2016) for 30  minute ear mass removal.

## 2016-02-27 ENCOUNTER — Ambulatory Visit (INDEPENDENT_AMBULATORY_CARE_PROVIDER_SITE_OTHER): Payer: Medicare Other | Admitting: Family Medicine

## 2016-02-27 ENCOUNTER — Encounter: Payer: Self-pay | Admitting: Family Medicine

## 2016-02-27 VITALS — BP 137/74 | HR 52 | Wt 213.0 lb

## 2016-02-27 DIAGNOSIS — L723 Sebaceous cyst: Secondary | ICD-10-CM

## 2016-02-27 DIAGNOSIS — L089 Local infection of the skin and subcutaneous tissue, unspecified: Secondary | ICD-10-CM

## 2016-02-27 NOTE — Progress Notes (Signed)
CC: Alexander [name] is a 65 y.o. male is here for cyst on ear   Subjective: HPI:  For a few decades he's had a growth on his left ear for a few decades he's had a growth on his left ear. He has been able to squeeze out a cheesy substance in the past however it comes back and gets larger than it was before. It slightly painful. Over the past week been more bothersome and painful. He's never had any procedure done other than squeezing it himself. Denies any skin lesions elsewhere. Denies fevers, chills or swollen lymph nodes.   Review Of Systems Outlined In HPI  Past Medical History:  Diagnosis Date  . Barrett's esophagus 09/30/2013  . Bipolar 1 disorder, mixed ([place]) 09/30/2013   [facility], Dr. Ardine Eng   . BPH (benign prostatic hyperplasia) 09/30/2013  . Essential hypertension, benign 09/30/2013    Past Surgical History:  Procedure Laterality Date  . COLOSTOMY REVERSAL    . large bowel perforation     No family history on file.  Social History   Social History  . Marital status: Married    Spouse name: N/A  . Number of children: N/A  . Years of education: N/A   Occupational History  . Not on file.   Social History Main Topics  . Smoking status: Former Smoker    Quit date: 09/30/1980  . Smokeless tobacco: Not on file  . Alcohol use 7.0 oz/week    14 drink(s) per week  . Drug use: No  . Sexual activity: Not Currently    Partners: Female   Other Topics Concern  . Not on file   Social History Narrative  . No narrative on file     Objective: BP 137/74 (BP Location: Right Arm, Patient Position: Sitting, Cuff Size: Normal)   Pulse (!) 52   Wt 213 lb (96.6 kg)   BMI 30.56 kg/m   Vital signs reviewed. General: Alert and Oriented, No Acute Distress HEENT: Pupils equal, round, reactive to light. Conjunctivae clear.  External ears unremarkable.  Moist mucous membranes. Lungs: Clear and comfortable work of breathing, speaking in full sentences without accessory muscle  use. Cardiac: Regular rate and rhythm.  Neuro: CN II-XII grossly intact, gait normal. Extremities: No peripheral edema.  Strong peripheral pulses.  Mental Status: No depression, anxiety, nor agitation. Logical though process. Skin: Warm and dry. 4 mm sebaceous cyst on the helix of the left ear slightly tender to the touch  Assessment & Plan: [name] was seen today for cyst on ear.  Diagnoses and all orders for this visit:  Infected sebaceous cyst   Patient specifically requests removal of the sebaceous cyst which seems reasonable given the pain and infected appearance today.  Incision and Drainage Procedure Note  Pre-operative Diagnosis: infected seb. cyst  Post-operative Diagnosis: same  Indications: pain  Anesthesia: 1% plain lidocaine  Procedure Details  The procedure, risks and complications have been discussed in detail (including, but not limited to airway compromise, infection, bleeding) with the patient, and the patient has signed consent to the procedure.  The skin was sterilely prepped and draped over the affected area in the usual fashion. After adequate local anesthesia, I&D with a 69mm punch blade was performed on the left ear. Purulent drainage: absent The patient was observed until stable. success Findings: success  EBL: 15 cc's  Drains: none  Condition: Tolerated procedure well   Complications: none.     Return in about 1 week (around  03/05/2016) for Nurse Visit Suture Removal. Correction: Return Monday or Tuesday

## 2016-03-04 ENCOUNTER — Ambulatory Visit: Payer: Medicare Other

## 2016-03-05 ENCOUNTER — Ambulatory Visit (INDEPENDENT_AMBULATORY_CARE_PROVIDER_SITE_OTHER): Payer: Medicare Other | Admitting: Family Medicine

## 2016-03-05 VITALS — BP 120/65 | HR 52 | Wt 209.0 lb

## 2016-03-05 DIAGNOSIS — Z4802 Encounter for removal of sutures: Secondary | ICD-10-CM

## 2016-03-05 NOTE — Progress Notes (Signed)
Pt here to have sutures removed from L ear lobe. Ear cleaned with saline and 1 suture removed. Pt tolerated well. Thin layer of Triple antibiotic ointment applied.Alexander [name] [place]

## 2016-03-06 ENCOUNTER — Ambulatory Visit: Payer: Medicare Other

## 2016-03-17 DIAGNOSIS — G4733 Obstructive sleep apnea (adult) (pediatric): Secondary | ICD-10-CM | POA: Diagnosis not present

## 2016-03-24 ENCOUNTER — Ambulatory Visit (INDEPENDENT_AMBULATORY_CARE_PROVIDER_SITE_OTHER): Payer: Medicare Other | Admitting: Physician Assistant

## 2016-03-24 ENCOUNTER — Encounter: Payer: Self-pay | Admitting: Physician Assistant

## 2016-03-24 VITALS — BP 128/55 | HR 42 | Ht 70.0 in | Wt 211.0 lb

## 2016-03-24 DIAGNOSIS — E785 Hyperlipidemia, unspecified: Secondary | ICD-10-CM

## 2016-03-24 DIAGNOSIS — Z23 Encounter for immunization: Secondary | ICD-10-CM

## 2016-03-24 DIAGNOSIS — I10 Essential (primary) hypertension: Secondary | ICD-10-CM

## 2016-03-24 DIAGNOSIS — F3160 Bipolar disorder, current episode mixed, unspecified: Secondary | ICD-10-CM

## 2016-03-24 DIAGNOSIS — F411 Generalized anxiety disorder: Secondary | ICD-10-CM | POA: Diagnosis not present

## 2016-03-24 NOTE — Progress Notes (Signed)
   Subjective:    Patient ID: Alexander [name], male    DOB: 06/11/1951, 65 y.o.   MRN: [id number]  HPI Patient is a 65 year old male Who presents to the clinic to reestablish care with myself from Dr. Barbaraann Barthel his leaving the practice. He has no concerns or complaints today. He does not need any refills today.   Review of Systems See HPI.     Objective:   Physical Exam  Constitutional: He is oriented to person, place, and time. He appears well-developed and well-nourished.  HENT:  Head: Normocephalic and atraumatic.  Cardiovascular: Normal rate, regular rhythm and normal heart sounds.   Pulmonary/Chest: Effort normal and breath sounds normal.  Neurological: He is alert and oriented to person, place, and time.  Psychiatric: He has a normal mood and affect. His behavior is normal.          Assessment & Plan:  HTN- controlled. No need for refills today. Follow up in 6 months.   Bipolar 1, mixed- managed by Dr. Ardine Eng psychiatrist. lithium and Zoloft.   Hyperlipidemia- will follow up in 6 months.   prevnar 13 given today. Will call back when get high dose flu shot.

## 2016-03-31 ENCOUNTER — Ambulatory Visit: Payer: Medicare Other

## 2016-04-17 DIAGNOSIS — G4733 Obstructive sleep apnea (adult) (pediatric): Secondary | ICD-10-CM | POA: Diagnosis not present

## 2016-05-08 ENCOUNTER — Ambulatory Visit (INDEPENDENT_AMBULATORY_CARE_PROVIDER_SITE_OTHER): Payer: Medicare Other | Admitting: Family Medicine

## 2016-05-08 DIAGNOSIS — Z23 Encounter for immunization: Secondary | ICD-10-CM

## 2016-05-08 NOTE — Progress Notes (Signed)
Flu shot given

## 2016-05-17 DIAGNOSIS — G4733 Obstructive sleep apnea (adult) (pediatric): Secondary | ICD-10-CM | POA: Diagnosis not present

## 2016-05-26 ENCOUNTER — Other Ambulatory Visit: Payer: Self-pay | Admitting: *Deleted

## 2016-05-26 MED ORDER — BENAZEPRIL HCL 20 MG PO TABS: 20.0000 mg | ORAL_TABLET | Freq: Every day | ORAL | 2 refills | Status: DC

## 2016-06-03 ENCOUNTER — Encounter: Payer: Self-pay | Admitting: Physician Assistant

## 2016-06-03 ENCOUNTER — Ambulatory Visit (INDEPENDENT_AMBULATORY_CARE_PROVIDER_SITE_OTHER): Payer: Medicare Other | Admitting: Physician Assistant

## 2016-06-03 VITALS — BP 155/65 | HR 62 | Ht 70.0 in | Wt 202.0 lb

## 2016-06-03 DIAGNOSIS — G4733 Obstructive sleep apnea (adult) (pediatric): Secondary | ICD-10-CM | POA: Diagnosis not present

## 2016-06-03 DIAGNOSIS — I10 Essential (primary) hypertension: Secondary | ICD-10-CM

## 2016-06-03 DIAGNOSIS — Z9989 Dependence on other enabling machines and devices: Secondary | ICD-10-CM

## 2016-06-03 DIAGNOSIS — F411 Generalized anxiety disorder: Secondary | ICD-10-CM | POA: Diagnosis not present

## 2016-06-03 DIAGNOSIS — H938X2 Other specified disorders of left ear: Secondary | ICD-10-CM | POA: Diagnosis not present

## 2016-06-03 NOTE — Patient Instructions (Addendum)
Ask pharmacy about shingles shot.  Will refer to dermatology for ear mass.  Diarrhea I feel like could be nerves stay on zoloft 100mg  daily and clonazepam as needed.

## 2016-06-03 NOTE — Progress Notes (Signed)
   Subjective:    Patient ID: Alexander [name], male    DOB: 04-10-1951, 65 y.o.   MRN: [id number]  HPI Pt is a 65 yo male who presents to the clinic with multiple concerns.   .. Active Ambulatory Problems    Diagnosis Date Noted  . Essential hypertension, benign 09/30/2013  . Barrett's esophagus 09/30/2013  . Impaired fasting glucose 09/30/2013  . BPH (benign prostatic hyperplasia) 09/30/2013  . Bipolar 1 disorder, mixed ([place]) 09/30/2013  . Generalized anxiety disorder 09/30/2013  . Hyperlipidemia 10/03/2013  . History of diverticulitis 10/20/2013  . Preventive measure 10/20/2013  . OSA on CPAP 12/28/2013  . Osteoarthritis of carpometacarpal joint of left thumb 01/05/2015   Resolved Ambulatory Problems    Diagnosis Date Noted  . No Resolved Ambulatory Problems   Past Medical History:  Diagnosis Date  . Barrett's esophagus 09/30/2013  . Bipolar 1 disorder, mixed ([place]) 09/30/2013  . BPH (benign prostatic hyperplasia) 09/30/2013  . Essential hypertension, benign 09/30/2013   Pt wonders where to get shingles shot.   Pt needs a part for his CPAP that he wears every night.   HTN- he has not been taking norvasc. He just found the bottle dr. Ileene Rubens gave him yesterday. No CP, palpitations, SOB or dizziness.   He is still very "lost" after death of his wife. He does feel like zoloft is helping but struggling to get by a day without crying. No suicidal thoughts. He does have a new tremor since her death. His stools have also been very loose. No melena or hematochezia.      Review of Systems See HPI     Objective:   Physical Exam  Constitutional: He is oriented to person, place, and time. He appears well-developed and well-nourished.  HENT:  Head: Normocephalic and atraumatic.  Cardiovascular: Normal rate, regular rhythm and normal heart sounds.   Pulmonary/Chest: Effort normal and breath sounds normal. He has no wheezes.  Neurological: He is alert and oriented to person, place,  and time.  Tremor noted bilateral hands today.   Psychiatric: He has a normal mood and affect. His behavior is normal.          Assessment & Plan:  [name] KitchenMarland KitchenDiagnoses and all orders for this visit:  Ear mass, left -     Ambulatory referral to Dermatology  Essential hypertension, benign  OSA on CPAP  Generalized anxiety disorder      Ear mass, left- due to hyperkeratoic covering I would like for dermatology to see this patient. It could be a SCC.  HTN- pt had not been taking norvasc. BP high today. Start back on norvasc.   OSA on CPAP- amber, will find out how to order mask patient needs.   GAD/Depression- pt just lost his wife a little over a month ago. I would like to stay on zoloft and klonapin. Discussed klonapin only as needed. I think some of patient's loose stools could be GI related. No red flags. Pt had colonoscopy in 2014. Consider probiotic.

## 2016-06-06 DIAGNOSIS — H938X2 Other specified disorders of left ear: Secondary | ICD-10-CM | POA: Insufficient documentation

## 2016-06-06 HISTORY — DX: Other specified disorders of left ear: H93.8X2

## 2016-06-06 MED ORDER — AMBULATORY NON FORMULARY MEDICATION
0 refills | Status: DC
Start: 2016-06-06 — End: 2018-11-01

## 2016-06-13 DIAGNOSIS — G4733 Obstructive sleep apnea (adult) (pediatric): Secondary | ICD-10-CM | POA: Diagnosis not present

## 2016-06-17 DIAGNOSIS — G4733 Obstructive sleep apnea (adult) (pediatric): Secondary | ICD-10-CM | POA: Diagnosis not present

## 2016-07-02 ENCOUNTER — Other Ambulatory Visit: Payer: Self-pay | Admitting: *Deleted

## 2016-07-02 MED ORDER — AMLODIPINE BESYLATE 10 MG PO TABS: ORAL_TABLET | ORAL | 1 refills | Status: DC

## 2016-07-03 DIAGNOSIS — D485 Neoplasm of uncertain behavior of skin: Secondary | ICD-10-CM | POA: Diagnosis not present

## 2016-07-08 ENCOUNTER — Other Ambulatory Visit: Payer: Self-pay | Admitting: *Deleted

## 2016-07-08 MED ORDER — ATORVASTATIN CALCIUM 20 MG PO TABS: ORAL_TABLET | ORAL | 0 refills | Status: DC

## 2016-07-25 ENCOUNTER — Encounter: Payer: Self-pay | Admitting: Physician Assistant

## 2016-07-25 ENCOUNTER — Ambulatory Visit (INDEPENDENT_AMBULATORY_CARE_PROVIDER_SITE_OTHER): Payer: Medicare Other | Admitting: Physician Assistant

## 2016-07-25 VITALS — BP 146/73 | HR 56 | Ht 70.0 in | Wt 204.0 lb

## 2016-07-25 DIAGNOSIS — Z0000 Encounter for general adult medical examination without abnormal findings: Secondary | ICD-10-CM

## 2016-07-25 DIAGNOSIS — E7800 Pure hypercholesterolemia, unspecified: Secondary | ICD-10-CM

## 2016-07-25 DIAGNOSIS — H938X2 Other specified disorders of left ear: Secondary | ICD-10-CM | POA: Diagnosis not present

## 2016-07-25 DIAGNOSIS — Z79899 Other long term (current) drug therapy: Secondary | ICD-10-CM

## 2016-07-25 DIAGNOSIS — K2270 Barrett's esophagus without dysplasia: Secondary | ICD-10-CM

## 2016-07-25 DIAGNOSIS — I10 Essential (primary) hypertension: Secondary | ICD-10-CM

## 2016-07-25 LAB — LIPID PANEL
CHOL/HDL RATIO: 2.6 ratio (ref <5.0)
CHOLESTEROL: 153 mg/dL (ref <200)
HDL: 58 mg/dL (ref >40)
LDL Cholesterol: 68 mg/dL (ref <100)
Triglycerides: 137 mg/dL (ref <150)
VLDL: 27 mg/dL (ref <30)

## 2016-07-25 LAB — CBC WITH DIFFERENTIAL/PLATELET
BASOS PCT: 1 %
Basophils Absolute: 71 cells/uL (ref 0–200)
EOS PCT: 2 %
Eosinophils Absolute: 142 cells/uL (ref 15–500)
HEMATOCRIT: 43.9 % (ref 38.5–50.0)
HEMOGLOBIN: 14.4 g/dL (ref 13.2–17.1)
LYMPHS ABS: 1491 {cells}/uL (ref 850–3900)
Lymphocytes Relative: 21 %
MCH: 31.5 pg (ref 27.0–33.0)
MCHC: 32.8 g/dL (ref 32.0–36.0)
MCV: 96.1 fL (ref 80.0–100.0)
MONO ABS: 497 {cells}/uL (ref 200–950)
MPV: 10.4 fL (ref 7.5–12.5)
Monocytes Relative: 7 %
NEUTROS ABS: 4899 {cells}/uL (ref 1500–7800)
NEUTROS PCT: 69 %
Platelets: 237 10*3/uL (ref 140–400)
RBC: 4.57 MIL/uL (ref 4.20–5.80)
RDW: 13.2 % (ref 11.0–15.0)
WBC: 7.1 10*3/uL (ref 3.8–10.8)

## 2016-07-25 LAB — COMPLETE METABOLIC PANEL WITH GFR
ALBUMIN: 4.3 g/dL (ref 3.6–5.1)
ALK PHOS: 80 U/L (ref 40–115)
ALT: 31 U/L (ref 9–46)
AST: 29 U/L (ref 10–35)
BUN: 11 mg/dL (ref 7–25)
CALCIUM: 9.9 mg/dL (ref 8.6–10.3)
CHLORIDE: 105 mmol/L (ref 98–110)
CO2: 24 mmol/L (ref 20–31)
Creat: 0.79 mg/dL (ref 0.70–1.25)
Glucose, Bld: 98 mg/dL (ref 65–99)
POTASSIUM: 4.1 mmol/L (ref 3.5–5.3)
Sodium: 138 mmol/L (ref 135–146)
Total Bilirubin: 1.3 mg/dL — ABNORMAL HIGH (ref 0.2–1.2)
Total Protein: 6.9 g/dL (ref 6.1–8.1)

## 2016-07-25 MED ORDER — BENAZEPRIL HCL 20 MG PO TABS: 20.0000 mg | ORAL_TABLET | Freq: Every day | ORAL | 3 refills | Status: DC

## 2016-07-25 MED ORDER — DOXAZOSIN MESYLATE 4 MG PO TABS: ORAL_TABLET | ORAL | 3 refills | Status: DC

## 2016-07-25 NOTE — Progress Notes (Signed)
   Subjective:    Patient ID: Alexander [name], male    DOB: 07-17-1951, 65 y.o.   MRN: [id number]  HPI Pt is a 65 yo male who presents to the clinic to have mass on ear removed. I sent to dermatology but he did not like the provider and did not want him to remove it. We got a note from [facility] [place] [facility] and suspected it was SCC of left ear. Pt is having some discomfort and pain with mass. He does feel like it is getting bigger.   Pt has an appt for January and wonders if he can get med refills now.    Review of Systems  All other systems reviewed and are negative.      Objective:   Physical Exam  HENT:  Head:            Assessment & Plan:  [name] KitchenMarland KitchenDiagnoses and all orders for this visit:  Pure hypercholesterolemia -     Lipid panel  Medication management -     COMPLETE METABOLIC PANEL WITH GFR  Routine history and physical examination of adult -     Lipid panel -     COMPLETE METABOLIC PANEL WITH GFR -     CBC with Differential/Platelet -     PSA, total and free  Mass of left ear -     Dermatology pathology  Essential hypertension, benign -     benazepril (LOTENSIN) 20 MG tablet; Take 1 tablet (20 mg total) by mouth daily. -     doxazosin (CARDURA) 4 MG tablet; TAKE 1 TABLET (4 MG TOTAL) BY MOUTH 2 (TWO) TIMES DAILY.  Barrett's esophagus without dysplasia   Discussed suspect SCC. If it is needs to be sent to dermatology to get rid of all cells.   Will refill statin based on labs. Printed for lipid recheck.

## 2016-07-29 ENCOUNTER — Ambulatory Visit (INDEPENDENT_AMBULATORY_CARE_PROVIDER_SITE_OTHER): Payer: Medicare Other | Admitting: Physician Assistant

## 2016-07-29 ENCOUNTER — Encounter: Payer: Self-pay | Admitting: Physician Assistant

## 2016-07-29 VITALS — BP 137/69 | HR 62

## 2016-07-29 DIAGNOSIS — H6012 Cellulitis of left external ear: Secondary | ICD-10-CM

## 2016-07-29 LAB — PSA, TOTAL AND FREE
PSA, % FREE: 36 % (ref >25)
PSA, FREE: 0.5 ng/mL
PSA, TOTAL: 1.4 ng/mL (ref <=4.0)

## 2016-07-29 MED ORDER — CEPHALEXIN 500 MG PO CAPS: 500.0000 mg | ORAL_CAPSULE | Freq: Two times a day (BID) | ORAL | 0 refills | Status: DC

## 2016-07-29 NOTE — Progress Notes (Signed)
   Subjective:    Patient ID: CAVANI DEBRUYN, male    DOB: October 08, 1950, 65 y.o.   MRN: [id number]  HPI Pt is a 65 yo male who presents to the clinic for left ear recheck after biopsy on 07/25/16. He complains because he has scant discharge and ear is warm, puffy tender to touch. No fever or chills. Not tried anything to make better. He has been keeping covered with a bandage.    Review of Systems See HPI>     Objective:   Physical Exam  HENT:  Head:            Assessment & Plan:  [name] KitchenMarland KitchenDiagnoses and all orders for this visit:  Cellulitis of left external ear -     cephALEXin (KEFLEX) 500 MG capsule; Take 1 capsule (500 mg total) by mouth 2 (two) times daily. For 7 days.   Will treat today due to infection symptoms above. Symptomatic care discussed. Biopsy path is not back today. Will call pt with results as we get them.   Recheck from biopsy. Do not charge for this visit.

## 2016-07-30 NOTE — Progress Notes (Signed)
Actinic keratosis(pre-cancerous lesion). No further treatment needed.

## 2016-07-30 NOTE — Progress Notes (Signed)
PSA normal

## 2016-08-13 ENCOUNTER — Other Ambulatory Visit: Payer: Self-pay | Admitting: Physician Assistant

## 2016-09-02 ENCOUNTER — Ambulatory Visit: Payer: Medicare Other | Admitting: Physician Assistant

## 2016-09-05 ENCOUNTER — Other Ambulatory Visit: Payer: Self-pay | Admitting: *Deleted

## 2016-09-05 MED ORDER — AMLODIPINE BESYLATE 10 MG PO TABS: ORAL_TABLET | ORAL | 1 refills | Status: DC

## 2016-11-25 ENCOUNTER — Telehealth: Payer: Self-pay

## 2016-11-25 NOTE — Telephone Encounter (Signed)
Ok to print rx with instructions and I will sign.

## 2016-11-25 NOTE — Telephone Encounter (Signed)
Pt is still reporting feeling tired when he wakes up but is sleeping through night using equipment. Would like to have a rx with instructions to move pt from 7-20cm H2O to 9-20cm. Please advise.

## 2016-11-26 ENCOUNTER — Other Ambulatory Visit: Payer: Self-pay

## 2016-11-26 MED ORDER — AMBULATORY NON FORMULARY MEDICATION: 0 refills | Status: AC

## 2016-12-19 ENCOUNTER — Other Ambulatory Visit: Payer: Self-pay | Admitting: *Deleted

## 2016-12-19 MED ORDER — PANTOPRAZOLE SODIUM 40 MG PO TBEC: DELAYED_RELEASE_TABLET | ORAL | 3 refills | Status: DC

## 2017-01-20 ENCOUNTER — Other Ambulatory Visit: Payer: Self-pay | Admitting: *Deleted

## 2017-01-20 MED ORDER — ATORVASTATIN CALCIUM 20 MG PO TABS: 20.0000 mg | ORAL_TABLET | Freq: Every day | ORAL | 1 refills | Status: DC

## 2017-03-25 ENCOUNTER — Ambulatory Visit (INDEPENDENT_AMBULATORY_CARE_PROVIDER_SITE_OTHER): Payer: Medicare Other | Admitting: Physician Assistant

## 2017-03-25 ENCOUNTER — Encounter: Payer: Self-pay | Admitting: Physician Assistant

## 2017-03-25 VITALS — BP 127/64 | HR 57 | Temp 98.5°F | Wt 211.0 lb

## 2017-03-25 DIAGNOSIS — G8929 Other chronic pain: Secondary | ICD-10-CM | POA: Insufficient documentation

## 2017-03-25 DIAGNOSIS — L570 Actinic keratosis: Secondary | ICD-10-CM | POA: Diagnosis not present

## 2017-03-25 DIAGNOSIS — G2581 Restless legs syndrome: Secondary | ICD-10-CM | POA: Diagnosis not present

## 2017-03-25 DIAGNOSIS — M25561 Pain in right knee: Secondary | ICD-10-CM

## 2017-03-25 DIAGNOSIS — Z23 Encounter for immunization: Secondary | ICD-10-CM | POA: Diagnosis not present

## 2017-03-25 DIAGNOSIS — R197 Diarrhea, unspecified: Secondary | ICD-10-CM | POA: Diagnosis not present

## 2017-03-25 DIAGNOSIS — M25562 Pain in left knee: Secondary | ICD-10-CM | POA: Diagnosis not present

## 2017-03-25 DIAGNOSIS — L720 Epidermal cyst: Secondary | ICD-10-CM

## 2017-03-25 DIAGNOSIS — L821 Other seborrheic keratosis: Secondary | ICD-10-CM

## 2017-03-25 HISTORY — DX: Other seborrheic keratosis: L82.1

## 2017-03-25 HISTORY — DX: Restless legs syndrome: G25.81

## 2017-03-25 HISTORY — DX: Diarrhea, unspecified: R19.7

## 2017-03-25 HISTORY — DX: Other chronic pain: G89.29

## 2017-03-25 LAB — CBC WITH DIFFERENTIAL/PLATELET
BASOS ABS: 0 {cells}/uL (ref 0–200)
Basophils Relative: 0 %
EOS PCT: 2 %
Eosinophils Absolute: 136 cells/uL (ref 15–500)
HEMATOCRIT: 42.3 % (ref 38.5–50.0)
HEMOGLOBIN: 14.1 g/dL (ref 13.2–17.1)
LYMPHS ABS: 1224 {cells}/uL (ref 850–3900)
Lymphocytes Relative: 18 %
MCH: 31.9 pg (ref 27.0–33.0)
MCHC: 33.3 g/dL (ref 32.0–36.0)
MCV: 95.7 fL (ref 80.0–100.0)
MONO ABS: 476 {cells}/uL (ref 200–950)
MPV: 10.5 fL (ref 7.5–12.5)
Monocytes Relative: 7 %
NEUTROS PCT: 73 %
Neutro Abs: 4964 cells/uL (ref 1500–7800)
Platelets: 214 10*3/uL (ref 140–400)
RBC: 4.42 MIL/uL (ref 4.20–5.80)
RDW: 13.5 % (ref 11.0–15.0)
WBC: 6.8 10*3/uL (ref 3.8–10.8)

## 2017-03-25 NOTE — Patient Instructions (Addendum)
Ok for tumeric 500mg  twice a day.  Avoid milk for next 2 weeks.   Restless Legs Syndrome Restless legs syndrome is a condition that causes uncomfortable feelings or sensations in the legs, especially while sitting or lying down. The sensations usually cause an overwhelming urge to move the legs. The arms can also sometimes be affected. The condition can range from mild to severe. The symptoms often interfere with a person's ability to sleep. What are the causes? The cause of this condition is not known. What increases the risk? This condition is more likely to develop in:  People who are older than age 5.  Pregnant women. In general, restless legs syndrome is more common in women than in men.  People who have a family history of the condition.  People who have certain medical conditions, such as iron deficiency, kidney disease, Parkinson disease, or nerve damage.  People who take certain medicines, such as medicines for high blood pressure, nausea, colds, allergies, depression, and some heart conditions.  What are the signs or symptoms? The main symptom of this condition is uncomfortable sensations in the legs. These sensations may be:  Described as pulling, tingling, prickling, throbbing, crawling, or burning.  Worse while you are sitting or lying down.  Worse during periods of rest or inactivity.  Worse at night, often interfering with your sleep.  Accompanied by a very strong urge to move your legs.  Temporarily relieved by movement of your legs.  The sensations usually affect both sides of the body. The arms can also be affected, but this is rare. People who have this condition often have tiredness during the day because of their lack of sleep at night. How is this diagnosed? This condition may be diagnosed based on your description of the symptoms. You may also have tests, including blood tests, to check for other conditions that may lead to your symptoms. In some cases,  you may be asked to spend some time in a sleep lab so your sleeping can be monitored. How is this treated? Treatment for this condition is focused on managing the symptoms. Treatment may include:  Self-help and lifestyle changes.  Medicines.  Follow these instructions at home:  Take medicines only as directed by your health care provider.  Try these methods to get temporary relief from the uncomfortable sensations: ? Massage your legs. ? Walk or stretch. ? Take a cold or hot bath.  Practice good sleep habits. For example, go to bed and get up at the same time every day.  Exercise regularly.  Practice ways of relaxing, such as yoga or meditation.  Avoid caffeine and alcohol.  Do not use any tobacco products, including cigarettes, chewing tobacco, or electronic cigarettes. If you need help quitting, ask your health care provider.  Keep all follow-up visits as directed by your health care provider. This is important. Contact a health care provider if: Your symptoms do not improve with treatment, or they get worse. This information is not intended to replace advice given to you by your health care provider. Make sure you discuss any questions you have with your health care provider. Document Released: 07/11/2002 Document Revised: 12/27/2015 Document Reviewed: 07/17/2014 Elsevier Interactive Patient Education  [employer].

## 2017-03-25 NOTE — Progress Notes (Signed)
Subjective:    Patient ID: Alexander [name], male    DOB: 11-26-1950, 66 y.o.   MRN: [id number]  HPI  Pt is a 66 yo male who presents to the clinic for follow up and concerns.   Diarrhea- for the last year he has had diarrhea only in the morning and only if he eats breakfast.bowel movement usually occur within 20 minutes of eating breakfast. If he skips breakfast and goes to lunch then does not have diarrhea. No abominal pain. He drinks coffee but not in the morning. In the mornings he has a bowl of ceral and 1 glass of chocolate milk. Dr. Ileene Rubens told him to start a probiotic but has not helped. He wonders if processed foods could be causing this.   Pt also complains of left knee pain off and on. He has been told it is arthritis. He had a steroid injection in the past and worked well.   He does complain of bilateral leg pain and "jumpiness" at bedtime. Tylenol works but wonders if there is anything else he can do.   Pt has some skin spots he would like looked at.   .. Active Ambulatory Problems    Diagnosis Date Noted  . Essential hypertension, benign 09/30/2013  . Barrett's esophagus 09/30/2013  . Impaired fasting glucose 09/30/2013  . BPH (benign prostatic hyperplasia) 09/30/2013  . Bipolar 1 disorder, mixed ([place]) 09/30/2013  . Generalized anxiety disorder 09/30/2013  . Hyperlipidemia 10/03/2013  . History of diverticulitis 10/20/2013  . Preventive measure 10/20/2013  . OSA on CPAP 12/28/2013  . Osteoarthritis of carpometacarpal joint of left thumb 01/05/2015  . Ear mass, left 06/06/2016  . Need for influenza vaccination 03/25/2017  . Acute pain of left knee 03/25/2017  . Epidermal cyst 03/25/2017  . Seborrheic keratoses 03/25/2017  . RLS (restless legs syndrome) 03/25/2017  . Diarrhea 03/25/2017   Resolved Ambulatory Problems    Diagnosis Date Noted  . No Resolved Ambulatory Problems   Past Medical History:  Diagnosis Date  . Barrett's esophagus 09/30/2013  . Bipolar 1  disorder, mixed ([place]) 09/30/2013  . BPH (benign prostatic hyperplasia) 09/30/2013  . Essential hypertension, benign 09/30/2013   .     Review of Systems  All other systems reviewed and are negative.      Objective:   Physical Exam  Constitutional: He is oriented to person, place, and time. He appears well-developed and well-nourished.  HENT:  Head: Normocephalic and atraumatic.  Cardiovascular: Normal rate, regular rhythm and normal heart sounds.   Pulmonary/Chest: Effort normal and breath sounds normal.  Neurological: He is alert and oriented to person, place, and time.  Skin:  Right temple flat 34mm by 3cmm erythematous with some fine scales.   3 epidermal cyst  with puntate center on the back of neck.   Psychiatric: He has a normal mood and affect.          Assessment & Plan:  [name] KitchenMarland KitchenElenore Rota L was seen today for diarrhea.  Diagnoses and all orders for this visit:  Diarrhea, unspecified type -     CBC with Differential/Platelet -     COMPLETE METABOLIC PANEL WITH GFR -     Lipase  RLS (restless legs syndrome) -     CBC with Differential/Platelet -     B12 -     Ferritin -     VITAMIN D 25 Hydroxy (Vit-D Deficiency, Fractures)  Actinic keratoses  Epidermal cyst  Acute pain of left knee  Need for  influenza vaccination -     Flu vaccine HIGH DOSE PF (Fluzone High dose)  Other orders -     Cancel: Flu Vaccine QUAD 6+ mos PF IM (Fluarix Quad PF)   I will order some labs but I feel like symptoms could be related to milk intolerance. He does have milk every morning. Stop this for 2 weeks and let me know how bowel movements are.   Discussed can make another appt for epidermal cyst removal. Discussed this is benign.   Cryotherapy Procedure Note  Pre-operative Diagnosis: actinic keratoses   Post-operative Diagnosis: Actinic keratosis  Locations: right temple.   Indications: precancerous  Procedure Details  History of allergy to iodine: no. Pacemaker?  no.  Patient informed of risks (permanent scarring, infection, light or dark discoloration, bleeding, infection, weakness, numbness and recurrence of the lesion) and benefits of the procedure and verbal informed consent obtained.  The areas are treated with liquid nitrogen therapy, frozen until ice ball extended 2 mm beyond lesion, allowed to thaw, and treated again. The patient tolerated procedure well.  The patient was instructed on post-op care, warned that there may be blister formation, redness and pain. Recommend OTC analgesia as needed for pain.  Condition: Stable  Complications: none.  Plan: 1. Instructed to keep the area dry and covered for 24-48h and clean thereafter. 2. Warning signs of infection were reviewed.   3. Recommended that the patient use OTC acetaminophen as needed for pain.    Given HO on RLS. Discussed medication he would like to hold on for now. Will check for anemia. Ok to continue with tylenol as long as liver looks good.   Encouraged patient to make appt with sports medicine for knee injection. Consider wearing a knee sleeve with exercising. Consider tumeric as a anti-inflammatory as he is somewhat scared to use NSAIDs due to problems his wife had.

## 2017-03-26 LAB — COMPLETE METABOLIC PANEL WITH GFR
ALT: 35 U/L (ref 9–46)
AST: 26 U/L (ref 10–35)
Albumin: 4.5 g/dL (ref 3.6–5.1)
Alkaline Phosphatase: 83 U/L (ref 40–115)
BUN: 12 mg/dL (ref 7–25)
CHLORIDE: 107 mmol/L (ref 98–110)
CO2: 20 mmol/L (ref 20–32)
Calcium: 10.3 mg/dL (ref 8.6–10.3)
Creat: 0.83 mg/dL (ref 0.70–1.25)
GFR, Est African American: >89 mL/min (ref >=60)
GFR, Est Non African American: >89 mL/min (ref >=60)
GLUCOSE: 115 mg/dL — AB (ref 65–99)
POTASSIUM: 4 mmol/L (ref 3.5–5.3)
Sodium: 138 mmol/L (ref 135–146)
Total Bilirubin: 1.4 mg/dL — ABNORMAL HIGH (ref 0.2–1.2)
Total Protein: 6.9 g/dL (ref 6.1–8.1)

## 2017-03-26 LAB — VITAMIN D 25 HYDROXY (VIT D DEFICIENCY, FRACTURES): VIT D 25 HYDROXY: 39 ng/mL (ref 30–100)

## 2017-03-26 LAB — FERRITIN: Ferritin: 389 ng/mL — ABNORMAL HIGH (ref 20–380)

## 2017-03-26 LAB — VITAMIN B12: Vitamin B-12: 406 pg/mL (ref 200–1100)

## 2017-03-26 LAB — LIPASE: Lipase: 40 U/L (ref 7–60)

## 2017-04-08 ENCOUNTER — Telehealth: Payer: Self-pay | Admitting: Physician Assistant

## 2017-04-08 NOTE — Telephone Encounter (Signed)
Pt wanted to call and let Luvenia Starch know his Diarrhea is getting a little better in the mornings and he will call back If not better

## 2017-04-24 ENCOUNTER — Ambulatory Visit (INDEPENDENT_AMBULATORY_CARE_PROVIDER_SITE_OTHER): Payer: Medicare Other | Admitting: Physician Assistant

## 2017-04-24 ENCOUNTER — Encounter: Payer: Self-pay | Admitting: Physician Assistant

## 2017-04-24 VITALS — BP 154/80 | HR 49 | Wt 214.0 lb

## 2017-04-24 DIAGNOSIS — D170 Benign lipomatous neoplasm of skin and subcutaneous tissue of head, face and neck: Secondary | ICD-10-CM

## 2017-04-24 DIAGNOSIS — L720 Epidermal cyst: Secondary | ICD-10-CM

## 2017-04-24 DIAGNOSIS — T148XXA Other injury of unspecified body region, initial encounter: Secondary | ICD-10-CM

## 2017-04-24 DIAGNOSIS — R58 Hemorrhage, not elsewhere classified: Secondary | ICD-10-CM | POA: Insufficient documentation

## 2017-04-24 HISTORY — DX: Benign lipomatous neoplasm of skin and subcutaneous tissue of head, face and neck: D17.0

## 2017-04-24 HISTORY — DX: Other injury of unspecified body region, initial encounter: T14.8XXA

## 2017-04-24 NOTE — Progress Notes (Addendum)
   Subjective:    Patient ID: Alexander [name], male    DOB: Nov 11, 1950, 66 y.o.   MRN: [id number]  HPI Pt presented to clinic to have 2 "lumps" removed off his neck. thery irritate him a lot and his shirts rubs on lumps and makes them inflammed.    Review of Systems  All other systems reviewed and are negative.      Objective:   Physical Exam  Constitutional: He appears well-developed and well-nourished.  HENT:  Head: Normocephalic and atraumatic.  Cardiovascular: Normal rate, regular rhythm and normal heart sounds.   Skin:             Assessment & Plan:  [name] KitchenMarland KitchenGenevieve [name] was seen today for lump on the back of neck.  Diagnoses and all orders for this visit:  Lipoma of neck -     Cancel: Dermatology pathology -     Dermatology pathology  Epidermal cyst  Hematoma  Excessive bleeding   Sebaceous Cyst Excision Procedure Note  Pre-operative Diagnosis: 2 sebaceous cyst  Post-operative Diagnosis: 1 lipoma and 1 sebaceous cyst  Locations:upper neck midline and right upper back.  Indications: irritation   Anesthesia: Lidocaine 2% with epinephrine without added sodium bicarbonate  Procedure Details  History of allergy to iodine: no  Patient informed of the risks (including bleeding and infection) and benefits of the  procedure and Verbal informed consent obtained.  The lesion and surrounding area was given a sterile prep using betadyne and draped in the usual sterile fashion. An incision was made over the cyst, which was dissected free of the surrounding tissue and removed.  The  Right upper back cyst was filled with typical sebaceous material.  The mid neck cyst appeared to be like fatty tissue/tumor. The wound was closed with 4-0 Prolene using simple interrupted stitches. Antibiotic ointment and a sterile dressing applied.  The one specimen that was questionable lipoma was sent for pathologic examination and the other cyst was not. The patient tolerated the procedure  well.  EBL: scant at time of procedure. However likely after lido with epi wore off patient presented with heavy bleeding.  Findings: questionable lipoma/tumor and sebaceous cyst.   Condition: Stable  Complications: bleeding. See note below.  Plan: 1. Instructed to keep the wound dry and covered for 24-48h and clean thereafter. 2. Warning signs of infection were reviewed.   3. Recommended that the patient use OTC acetaminophen as needed for pain.  4. Return for suture removal in 1 week.    Pt returned to the clinic with bleeding. Bandage covered in blood. Hematoma seen under skin. Stitches removed. Dr. Madilyn Fireman assisted my in stopped the bleeding:   Cauterization attempted but was bleeding too much. More lidocaine with epi about 59ml to help control blood. Ice pack to help with vasoconstriction. 1 pack of silver nitrate sticks used. Bleeding persisted. UC contacted and presented use with clotting sand that controlled bleeding. Pressure dressing applied. RTC in one week. Discussed what to do if bleeding reoccurs. Another pack of clotting sand was given.   Patient was on fish oil and ASA.   Spent 30 minutes with patient controlling bleeding and making sure he was stable.

## 2017-04-30 ENCOUNTER — Telehealth: Payer: Self-pay | Admitting: Physician Assistant

## 2017-04-30 ENCOUNTER — Ambulatory Visit: Payer: Medicare Other | Admitting: Family Medicine

## 2017-04-30 NOTE — Telephone Encounter (Signed)
Pt walked into clinic today stating he was changing the dressing from where he has a lipoma removed, and believes he took the stitch out. Brought Pt into a room and removed his dressing. The stitch was removed, and the area was open. Some minimal drainage. Did get Dr. Georgina Snell to look at the area, advised Pt to keep the area clean and dry. No changes. Pt will follow up with PCP on Monday. Clean dressing applied prior to leaving clinic.

## 2017-04-30 NOTE — Telephone Encounter (Signed)
Thankis. I actually took stitch out due to excessive bleeding so stitch did not fall out.

## 2017-05-01 ENCOUNTER — Ambulatory Visit: Payer: Medicare Other | Admitting: Physician Assistant

## 2017-05-04 ENCOUNTER — Encounter: Payer: Self-pay | Admitting: Physician Assistant

## 2017-05-04 ENCOUNTER — Ambulatory Visit: Payer: Medicare Other | Admitting: Physician Assistant

## 2017-05-04 ENCOUNTER — Ambulatory Visit (INDEPENDENT_AMBULATORY_CARE_PROVIDER_SITE_OTHER): Payer: Medicare Other | Admitting: Physician Assistant

## 2017-05-04 VITALS — BP 146/61 | HR 51 | Ht 70.0 in | Wt 212.0 lb

## 2017-05-04 DIAGNOSIS — L237 Allergic contact dermatitis due to plants, except food: Secondary | ICD-10-CM

## 2017-05-04 DIAGNOSIS — L723 Sebaceous cyst: Secondary | ICD-10-CM

## 2017-05-04 MED ORDER — METHYLPREDNISOLONE SODIUM SUCC 125 MG IJ SOLR
125.0000 mg | Freq: Once | INTRAMUSCULAR | Status: AC
  Administered 2017-05-04: 125 mg via INTRAMUSCULAR

## 2017-05-04 MED ORDER — TRIAMCINOLONE ACETONIDE 0.1 % EX CREA: 1.0000 "application " | TOPICAL_CREAM | Freq: Two times a day (BID) | CUTANEOUS | 1 refills | Status: DC

## 2017-05-04 NOTE — Progress Notes (Signed)
   Subjective:    Patient ID: Alexander [name], male    DOB: Mar 18, 1951, 66 y.o.   MRN: [id number]  HPI Pt is a 66 yo male who presents to the clinic for one week follow up on sebaceous cyst removal times 2 with possible lipoma.   Pt also has a itchy rash after working outside all weekend long. He is using and OTC cream for itching but not helping very much.   .. Active Ambulatory Problems    Diagnosis Date Noted  . Essential hypertension, benign 09/30/2013  . Barrett's esophagus 09/30/2013  . Impaired fasting glucose 09/30/2013  . BPH (benign prostatic hyperplasia) 09/30/2013  . Bipolar 1 disorder, mixed ([place]) 09/30/2013  . Generalized anxiety disorder 09/30/2013  . Hyperlipidemia 10/03/2013  . History of diverticulitis 10/20/2013  . Preventive measure 10/20/2013  . OSA on CPAP 12/28/2013  . Osteoarthritis of carpometacarpal joint of left thumb 01/05/2015  . Ear mass, left 06/06/2016  . Need for influenza vaccination 03/25/2017  . Acute pain of left knee 03/25/2017  . Epidermal cyst 03/25/2017  . Seborrheic keratoses 03/25/2017  . RLS (restless legs syndrome) 03/25/2017  . Diarrhea 03/25/2017  . Hematoma 04/24/2017  . Excessive bleeding 04/24/2017  . Lipoma of neck 04/24/2017   Resolved Ambulatory Problems    Diagnosis Date Noted  . No Resolved Ambulatory Problems   Past Medical History:  Diagnosis Date  . Barrett's esophagus 09/30/2013  . Bipolar 1 disorder, mixed ([place]) 09/30/2013  . BPH (benign prostatic hyperplasia) 09/30/2013  . Essential hypertension, benign 09/30/2013        Review of Systems  All other systems reviewed and are negative.      Objective:   Physical Exam  Constitutional: He is oriented to person, place, and time. He appears well-developed and well-nourished.  HENT:  Head: Normocephalic and atraumatic.  Cardiovascular: Normal rate, regular rhythm and normal heart sounds.   Neurological: He is alert and oriented to person, place, and time.   Skin:  Completely healed incision for small seb cyst. 2nd cyst was allowing to heal by secondary intention. Healing well. No warmth, swelling, tenderness. Scant discharge seen.   Pt has a erythematous rash with raised areas of erythema and vessicles over bilateral legs, torso, genitals.   Psychiatric: He has a normal mood and affect. His behavior is normal.          Assessment & Plan:  [name] KitchenMarland KitchenDiagnoses and all orders for this visit:  Poison ivy dermatitis -     triamcinolone cream (KENALOG) 0.1 %; Apply 1 application topically 2 (two) times daily. -     methylPREDNISolone sodium succinate (SOLU-MEDROL) 125 mg/2 mL injection 125 mg; Inject 2 mLs (125 mg total) into the muscle once.  Sebaceous cyst   Symptomatic care discussed for poison ivy. Cream given to use on very itchy spots.   Reassurance given. Discussed biopsy results benign. Will let continue to heal from secondary intention. hibiclens given since he does have some problems keeping clean. Soak daily and keep covered until completely healed. Follow up as needed.

## 2017-05-04 NOTE — Patient Instructions (Signed)
Poison Ivy Dermatitis Poison ivy dermatitis is redness and soreness (inflammation) of the skin. It is caused by a chemical that is found on the leaves of the poison ivy plant. You may also have itching, a rash, and blisters. Symptoms often clear up in 1-2 weeks. You may get this condition by touching a poison ivy plant. You can also get it by touching something that has the chemical on it. This may include animals or objects that have come in contact with the plant. Follow these instructions at home: General instructions  Take or apply over-the-counter and prescription medicines only as told by your doctor.  If you touch poison ivy, wash your skin with soap and cold water right away.  Use hydrocortisone creams or calamine lotion as needed to help with itching.  Take oatmeal baths as needed. Use colloidal oatmeal. You can get this at a pharmacy or grocery store. Follow the instructions on the package.  Do not scratch or rub your skin.  While you have the rash, wash your clothes right after you wear them. Prevention  Know what poison ivy looks like so you can avoid it. This plant has three leaves with flowering branches on a single stem. The leaves are glossy. They have uneven edges that come to a point at the front.  If you have touched poison ivy, wash with soap and water right away. Be sure to wash under your fingernails.  When hiking or camping, wear long pants, a long-sleeved shirt, tall socks, and hiking boots. You can also use a lotion on your skin that helps to prevent contact with the chemical on the plant.  If you think that your clothes or outdoor gear came in contact with poison ivy, rinse them off with a garden hose before you bring them inside your house. Contact a doctor if:  You have open sores in the rash area.  You have more redness, swelling, or pain in the affected area.  You have redness that spreads beyond the rash area.  You have fluid, blood, or pus coming from  the affected area.  You have a fever.  You have a rash over a large area of your body.  You have a rash on your eyes, mouth, or genitals.  Your rash does not get better after a few days. Get help right away if:  Your face swells or your eyes swell shut.  You have trouble breathing.  You have trouble swallowing. This information is not intended to replace advice given to you by your health care provider. Make sure you discuss any questions you have with your health care provider. Document Released: 08/23/2010 Document Revised: 12/27/2015 Document Reviewed: 12/27/2014 Elsevier Interactive Patient Education  2018 [employer]  

## 2017-06-06 ENCOUNTER — Other Ambulatory Visit: Payer: Self-pay | Admitting: Physician Assistant

## 2017-07-17 ENCOUNTER — Other Ambulatory Visit: Payer: Self-pay | Admitting: Physician Assistant

## 2017-07-17 DIAGNOSIS — I10 Essential (primary) hypertension: Secondary | ICD-10-CM

## 2017-07-29 ENCOUNTER — Other Ambulatory Visit: Payer: Self-pay | Admitting: Physician Assistant

## 2017-07-29 DIAGNOSIS — I10 Essential (primary) hypertension: Secondary | ICD-10-CM

## 2017-08-07 ENCOUNTER — Other Ambulatory Visit: Payer: Self-pay | Admitting: Physician Assistant

## 2017-08-11 ENCOUNTER — Ambulatory Visit: Payer: Medicare Other | Admitting: Physician Assistant

## 2017-08-12 ENCOUNTER — Ambulatory Visit (INDEPENDENT_AMBULATORY_CARE_PROVIDER_SITE_OTHER): Payer: Medicare HMO | Admitting: Physician Assistant

## 2017-08-12 ENCOUNTER — Encounter: Payer: Self-pay | Admitting: Physician Assistant

## 2017-08-12 VITALS — BP 134/74 | HR 46 | Ht 70.0 in | Wt 211.0 lb

## 2017-08-12 DIAGNOSIS — R059 Cough, unspecified: Secondary | ICD-10-CM

## 2017-08-12 DIAGNOSIS — Z1159 Encounter for screening for other viral diseases: Secondary | ICD-10-CM | POA: Diagnosis not present

## 2017-08-12 DIAGNOSIS — R05 Cough: Secondary | ICD-10-CM | POA: Diagnosis not present

## 2017-08-12 DIAGNOSIS — I10 Essential (primary) hypertension: Secondary | ICD-10-CM | POA: Diagnosis not present

## 2017-08-12 DIAGNOSIS — J0140 Acute pansinusitis, unspecified: Secondary | ICD-10-CM | POA: Diagnosis not present

## 2017-08-12 DIAGNOSIS — E782 Mixed hyperlipidemia: Secondary | ICD-10-CM

## 2017-08-12 HISTORY — DX: Acute pansinusitis, unspecified: J01.40

## 2017-08-12 LAB — COMPLETE METABOLIC PANEL WITH GFR
AG RATIO: 1.8 (calc) (ref 1.0–2.5)
ALT: 35 U/L (ref 9–46)
AST: 27 U/L (ref 10–35)
Albumin: 4.5 g/dL (ref 3.6–5.1)
Alkaline phosphatase (APISO): 81 U/L (ref 40–115)
BUN: 10 mg/dL (ref 7–25)
CALCIUM: 10.4 mg/dL — AB (ref 8.6–10.3)
CO2: 25 mmol/L (ref 20–32)
Chloride: 106 mmol/L (ref 98–110)
Creat: 0.77 mg/dL (ref 0.70–1.25)
GFR, EST AFRICAN AMERICAN: 109 mL/min/{1.73_m2} (ref >=60)
GFR, EST NON AFRICAN AMERICAN: 94 mL/min/{1.73_m2} (ref >=60)
GLUCOSE: 109 mg/dL — AB (ref 65–99)
Globulin: 2.5 g/dL (calc) (ref 1.9–3.7)
Potassium: 3.8 mmol/L (ref 3.5–5.3)
Sodium: 139 mmol/L (ref 135–146)
TOTAL PROTEIN: 7 g/dL (ref 6.1–8.1)
Total Bilirubin: 2.1 mg/dL — ABNORMAL HIGH (ref 0.2–1.2)

## 2017-08-12 LAB — HEPATITIS C ANTIBODY
Hepatitis C Ab: NONREACTIVE
SIGNAL TO CUT-OFF: 0.01 (ref <1.00)

## 2017-08-12 LAB — LIPID PANEL W/REFLEX DIRECT LDL
Cholesterol: 147 mg/dL (ref <200)
HDL: 62 mg/dL (ref >40)
LDL Cholesterol (Calc): 60 mg/dL (calc)
NON-HDL CHOLESTEROL (CALC): 85 mg/dL (ref <130)
Total CHOL/HDL Ratio: 2.4 (calc) (ref <5.0)
Triglycerides: 174 mg/dL — ABNORMAL HIGH (ref <150)

## 2017-08-12 MED ORDER — ATORVASTATIN CALCIUM 20 MG PO TABS: 20.0000 mg | ORAL_TABLET | Freq: Every day | ORAL | 1 refills | Status: DC

## 2017-08-12 MED ORDER — AMOXICILLIN-POT CLAVULANATE 875-125 MG PO TABS: 1.0000 | ORAL_TABLET | Freq: Two times a day (BID) | ORAL | 0 refills | Status: DC

## 2017-08-12 MED ORDER — BENAZEPRIL HCL 20 MG PO TABS: 20.0000 mg | ORAL_TABLET | Freq: Every day | ORAL | 1 refills | Status: DC

## 2017-08-12 MED ORDER — AMLODIPINE BESYLATE 10 MG PO TABS: ORAL_TABLET | ORAL | 1 refills | Status: DC

## 2017-08-12 MED ORDER — DOXAZOSIN MESYLATE 4 MG PO TABS: 4.0000 mg | ORAL_TABLET | Freq: Two times a day (BID) | ORAL | 1 refills | Status: DC

## 2017-08-12 NOTE — Progress Notes (Addendum)
Subjective:    Patient ID: ARVON SCHREINER, male    DOB: 18-Dec-1950, 67 y.o.   MRN: [id number]  HPI Pt is a 67 year old male that presents to the clinic for a check up and inquiry for a refill on his blood pressure medication.   HTN- denies any CP, palpitations, headaches, or vision changes. Taking medication daily without any concerns.   hyperlipidemia on lipitor. Doing well. Needs labs.   He also reports coughing up yellow mucous material for the past three weeks and this occurs about 15 times a day. He denies rhinorrhea , pressure, or congestion. He has not tried anything to make better. Denies any fever, chills, SOB, chest tightness, or wheezing.   .. Active Ambulatory Problems    Diagnosis Date Noted  . Essential hypertension, benign 09/30/2013  . Barrett's esophagus 09/30/2013  . Impaired fasting glucose 09/30/2013  . BPH (benign prostatic hyperplasia) 09/30/2013  . Bipolar 1 disorder, mixed ([place]) 09/30/2013  . Generalized anxiety disorder 09/30/2013  . Hyperlipidemia 10/03/2013  . History of diverticulitis 10/20/2013  . Preventive measure 10/20/2013  . OSA on CPAP 12/28/2013  . Osteoarthritis of carpometacarpal joint of left thumb 01/05/2015  . Ear mass, left 06/06/2016  . Need for influenza vaccination 03/25/2017  . Acute pain of left knee 03/25/2017  . Epidermal cyst 03/25/2017  . Seborrheic keratoses 03/25/2017  . RLS (restless legs syndrome) 03/25/2017  . Diarrhea 03/25/2017  . Hematoma 04/24/2017  . Excessive bleeding 04/24/2017  . Lipoma of neck 04/24/2017  . Acute non-recurrent pansinusitis 08/12/2017   Resolved Ambulatory Problems    Diagnosis Date Noted  . No Resolved Ambulatory Problems   Past Medical History:  Diagnosis Date  . Barrett's esophagus 09/30/2013  . Bipolar 1 disorder, mixed ([place]) 09/30/2013  . BPH (benign prostatic hyperplasia) 09/30/2013  . Essential hypertension, benign 09/30/2013      Review of Systems  Constitutional: Negative  for chills, fatigue and fever.  HENT: Negative for congestion, ear pain, postnasal drip, rhinorrhea and sore throat.   Respiratory: Positive for cough. Negative for shortness of breath.        Objective:   Physical Exam  Constitutional: He is oriented to person, place, and time. He appears well-developed and well-nourished. No distress.  HENT:  Head: Normocephalic and atraumatic.  Right Ear: External ear normal.  Left Ear: External ear normal.  Pt has some cerumen with no acute blockage.   Eyes: Conjunctivae and EOM are normal. Right eye exhibits no discharge. Left eye exhibits no discharge.  Cardiovascular: Normal rate, regular rhythm and normal heart sounds. Exam reveals no gallop and no friction rub.  No murmur heard. Pulmonary/Chest: Effort normal and breath sounds normal. He has no wheezes.  Neurological: He is alert and oriented to person, place, and time.  Psychiatric: He has a normal mood and affect. His behavior is normal.   Vitals:   08/12/17 1043 08/12/17 1058  BP: (!) 144/62 134/74  Pulse: (!) 46           Assessment & Plan:  [name] was seen today for hypertension, hyperlipidemia and depression.  Diagnoses and all orders for this visit:  Mixed hyperlipidemia -     Lipid Panel w/reflex Direct LDL -     atorvastatin (LIPITOR) 20 MG tablet; Take 1 tablet (20 mg total) by mouth daily.  Essential hypertension, benign -     COMPLETE METABOLIC PANEL WITH GFR -     benazepril (LOTENSIN) 20 MG tablet; Take 1  tablet (20 mg total) by mouth daily. -     amLODipine (NORVASC) 10 MG tablet; TAKE 1 TABLET (10 MG TOTAL) BY MOUTH DAILY. -     doxazosin (CARDURA) 4 MG tablet; Take 1 tablet (4 mg total) by mouth 2 (two) times daily.  Need for hepatitis C screening test -     Hepatitis C Antibody  Cough  Acute non-recurrent pansinusitis -     amoxicillin-clavulanate (AUGMENTIN) 875-125 MG tablet; Take 1 tablet by mouth 2 (two) times daily.   Pt was given education on  his diagnosis and given instructions on his medications. All medications were refilled and laboratory tests were ordered. He will receive a call back regarding his lab results. He has been instructed to call the office if his cough and mucous production does not resolve after taking his antibiotics. HO given for symptomatic care.

## 2017-08-12 NOTE — Addendum Note (Signed)
Addended by: Donella Stade on: 08/12/2017 04:53 PM   Modules accepted: Level of Service

## 2017-08-20 DIAGNOSIS — R69 Illness, unspecified: Secondary | ICD-10-CM | POA: Diagnosis not present

## 2017-08-21 ENCOUNTER — Telehealth: Payer: Self-pay | Admitting: Physician Assistant

## 2017-08-21 NOTE — Telephone Encounter (Signed)
Patient is coming in for his annual physical next Wednesday. Pt mentioned needing some additional blood work ordered due to some medication he is taking. Could we get those orders put in as well as routine labs for his physical? Please advise. Thanks!

## 2017-08-25 ENCOUNTER — Encounter: Payer: Medicare HMO | Admitting: Physician Assistant

## 2017-08-25 NOTE — Telephone Encounter (Signed)
Pt not due for labs just yet. Will discuss at appointment tomorrow.

## 2017-08-26 ENCOUNTER — Ambulatory Visit (INDEPENDENT_AMBULATORY_CARE_PROVIDER_SITE_OTHER): Payer: Medicare HMO

## 2017-08-26 ENCOUNTER — Encounter: Payer: Self-pay | Admitting: Physician Assistant

## 2017-08-26 ENCOUNTER — Ambulatory Visit (INDEPENDENT_AMBULATORY_CARE_PROVIDER_SITE_OTHER): Payer: Medicare HMO | Admitting: Physician Assistant

## 2017-08-26 VITALS — BP 137/60 | HR 46 | Ht 70.0 in | Wt 214.0 lb

## 2017-08-26 DIAGNOSIS — R059 Cough, unspecified: Secondary | ICD-10-CM

## 2017-08-26 DIAGNOSIS — G2581 Restless legs syndrome: Secondary | ICD-10-CM

## 2017-08-26 DIAGNOSIS — I517 Cardiomegaly: Secondary | ICD-10-CM | POA: Diagnosis not present

## 2017-08-26 DIAGNOSIS — R7301 Impaired fasting glucose: Secondary | ICD-10-CM | POA: Diagnosis not present

## 2017-08-26 DIAGNOSIS — Z0000 Encounter for general adult medical examination without abnormal findings: Secondary | ICD-10-CM | POA: Diagnosis not present

## 2017-08-26 DIAGNOSIS — R413 Other amnesia: Secondary | ICD-10-CM | POA: Diagnosis not present

## 2017-08-26 DIAGNOSIS — M25561 Pain in right knee: Secondary | ICD-10-CM

## 2017-08-26 DIAGNOSIS — Z79899 Other long term (current) drug therapy: Secondary | ICD-10-CM | POA: Diagnosis not present

## 2017-08-26 DIAGNOSIS — G8929 Other chronic pain: Secondary | ICD-10-CM

## 2017-08-26 DIAGNOSIS — R05 Cough: Secondary | ICD-10-CM

## 2017-08-26 DIAGNOSIS — K580 Irritable bowel syndrome with diarrhea: Secondary | ICD-10-CM

## 2017-08-26 MED ORDER — DICYCLOMINE HCL 10 MG PO CAPS: 10.0000 mg | ORAL_CAPSULE | Freq: Three times a day (TID) | ORAL | 0 refills | Status: DC

## 2017-08-26 MED ORDER — MELOXICAM 15 MG PO TABS: 15.0000 mg | ORAL_TABLET | Freq: Every day | ORAL | 1 refills | Status: DC

## 2017-08-26 MED ORDER — AZELASTINE HCL 0.1 % NA SOLN: 2.0000 | Freq: Two times a day (BID) | NASAL | 0 refills | Status: DC

## 2017-08-26 NOTE — Progress Notes (Signed)
Subjective:   Alexander [name] is a 67 y.o. male who presents for Medicare Annual/Subsequent preventive examination.  Review of Systems:   pt mentions "jumpiness of legs" at night. He also has chronic pain of right knee which he has not brought up recently. He has had xrays and knee injections but have not worked that well. He continues to have the productive cough. abx did not do anything for cough. No fever, chills, body aches. He also has diarrhea for past year or so only in ams after breakfast. After he has his first loose stool the rest are fine. C/o of some abdominal cramping but relieved by bowel movement.      Objective:    Vitals: BP 137/60   Pulse (!) 46   Ht 5\' 10"  (1.778 m)   Wt 214 lb (97.1 kg)   BMI 30.71 kg/m   Body mass index is 30.71 kg/m.  Advanced Directives 05/10/2015 12/28/2013  Does Patient Have a Medical Advance Directive? Yes Patient has advance directive, copy not in chart;Patient would like information  Type of Advance Directive Living will -  Does patient want to make changes to medical advance directive? No - Patient declined -  Copy of [place] in Chart? Yes -    Tobacco Social History   Tobacco Use  Smoking Status Former Smoker  . Last attempt to quit: 09/30/1980  . Years since quitting: 36.9  Smokeless Tobacco Never Used     Counseling given: Not Answered   Clinical Intake:                       Past Medical History:  Diagnosis Date  . Barrett's esophagus 09/30/2013  . Bipolar 1 disorder, mixed ([place]) 09/30/2013   [place], Dr. Ardine Eng   . BPH (benign prostatic hyperplasia) 09/30/2013  . Essential hypertension, benign 09/30/2013   Past Surgical History:  Procedure Laterality Date  . COLOSTOMY REVERSAL    . large bowel perforation     History reviewed. No pertinent family history. Social History   Socioeconomic History  . Marital status: Married    Spouse name: None  . Number of children: None   . Years of education: None  . Highest education level: None  Social Needs  . Financial resource strain: None  . Food insecurity - worry: None  . Food insecurity - inability: None  . Transportation needs - medical: None  . Transportation needs - non-medical: None  Occupational History  . None  Tobacco Use  . Smoking status: Former Smoker    Last attempt to quit: 09/30/1980    Years since quitting: 36.9  . Smokeless tobacco: Never Used  Substance and Sexual Activity  . Alcohol use: Yes    Alcohol/week: 7.0 oz    Types: 14 Standard drinks or equivalent per week  . Drug use: No  . Sexual activity: Not Currently    Partners: Female  Other Topics Concern  . None  Social History Narrative  . None    Outpatient Encounter Medications as of 08/26/2017  Medication Sig  . AMBULATORY NON FORMULARY MEDICATION Zostavax IM once for shingles prevention.  . AMBULATORY NON FORMULARY MEDICATION Increase Auto-PAP machine with heated humidifier settings to 9-20cm H2O.  Dx: Obstructive sleep apnea.  [name] amLODipine (NORVASC) 10 MG tablet TAKE 1 TABLET (10 MG TOTAL) BY MOUTH DAILY.  [name] aspirin 81 MG tablet Take 81 mg by mouth daily.  [name] atorvastatin (LIPITOR) 20 MG tablet  Take 1 tablet (20 mg total) by mouth daily.  . benazepril (LOTENSIN) 20 MG tablet Take 1 tablet (20 mg total) by mouth daily.  . clonazePAM (KLONOPIN) 0.5 MG tablet Take 0.5 mg by mouth 2 (two) times daily as needed for anxiety.  [name] doxazosin (CARDURA) 4 MG tablet Take 1 tablet (4 mg total) by mouth 2 (two) times daily.  [name] LITHIUM CARBONATE PO Take 450 mg by mouth. Take 2 1/2 tablets per day  . Misc Natural Products (TUMERSAID PO) Take by mouth.  . Omega-3 Fatty Acids (FISH OIL) 1000 MG CAPS One by mouth twice a day with meals.  . pantoprazole (PROTONIX) 40 MG tablet TAKE 1 TABLET (40 MG TOTAL) BY MOUTH 2 (TWO) TIMES DAILY.  [name] sertraline (ZOLOFT) 100 MG tablet Take 100 mg by mouth daily.  [name] triamcinolone cream (KENALOG) 0.1 % Apply 1  application topically 2 (two) times daily.  . [DISCONTINUED] amoxicillin-clavulanate (AUGMENTIN) 875-125 MG tablet Take 1 tablet by mouth 2 (two) times daily.  [name] azelastine (ASTELIN) 0.1 % nasal spray Place 2 sprays into both nostrils 2 (two) times daily. Use in each nostril as directed  . dicyclomine (BENTYL) 10 MG capsule Take 1 capsule (10 mg total) by mouth 4 (four) times daily -  before meals and at bedtime.  . meloxicam (MOBIC) 15 MG tablet Take 1 tablet (15 mg total) by mouth daily.  [name] rOPINIRole (REQUIP) 0.25 MG tablet Take 1 tablet (0.25 mg total) by mouth at bedtime. For restless legs.   No facility-administered encounter medications on file as of 08/26/2017.     Activities of Daily Living In your present state of health, do you have any difficulty performing the following activities: 08/30/2017  Hearing? N  Vision? N  Difficulty concentrating or making decisions? Y  Walking or climbing stairs? Y  Dressing or bathing? N  Doing errands, shopping? N  Some recent data might be hidden    Patient Care Team: Lavada Mesi as PCP - General (Family Medicine)   Assessment:   This is a routine wellness examination for Alexander L.  Exercise Activities and Dietary recommendations    Goals    None      Fall Risk No flowsheet data found. Is the patient's home free of loose throw rugs in walkways, pet beds, electrical cords, etc?   yes      Grab bars in the bathroom? no      Handrails on the stairs?   yes      Adequate lighting?   yes  Timed Get Up and Go Performed:   Depression Screen PHQ 2/9 Scores 03/25/2017  PHQ - 2 Score 0    Cognitive Function     6CIT Screen 08/26/2017  What Year? 0 points  What month? 0 points  What time? 0 points  Count back from 20 0 points  Months in reverse 2 points  Repeat phrase 10 points  Total Score 12    Immunization History  Administered Date(s) Administered  . Influenza, High Dose Seasonal PF 03/25/2017  .  Influenza,inj,Quad PF,6+ Mos 04/12/2014, 05/08/2016  . Pneumococcal Conjugate-13 03/24/2016  . Pneumococcal Polysaccharide-23 08/04/2012  . Tdap 02/28/2015    Qualifies for Shingles Vaccine? Yes, discussed in office today.   Screening Tests Health Maintenance  Topic Date Due  . PNA vac Low Risk Adult (2 of 2 - PPSV23) 08/04/2017  . COLONOSCOPY  10/22/2022  . TETANUS/TDAP  02/27/2025  . INFLUENZA VACCINE  Completed  . Hepatitis  C Screening  Completed   Cancer Screenings: Lung: Low Dose CT Chest recommended if Age 37-80 years, 30 pack-year currently smoking OR have quit w/in 15years. Patient does not qualify. Colorectal: up to date.    Hepatitis C Screening: done.     Plan:   [name] KitchenMarland KitchenJace Fermin was seen today for medicare wellness.  Diagnoses and all orders for this visit:  Medicare annual wellness visit, subsequent -     Lithium level -     TSH -     Hemoglobin A1c -     COMPLETE METABOLIC PANEL WITH GFR  RLS (restless legs syndrome) -     rOPINIRole (REQUIP) 0.25 MG tablet; Take 1 tablet (0.25 mg total) by mouth at bedtime. For restless legs.  Chronic pain of right knee -     meloxicam (MOBIC) 15 MG tablet; Take 1 tablet (15 mg total) by mouth daily.  Medication management -     Lithium level -     TSH -     COMPLETE METABOLIC PANEL WITH GFR  Elevated fasting glucose -     Hemoglobin A1c  Cough -     DG Chest 2 View -     azelastine (ASTELIN) 0.1 % nasal spray; Place 2 sprays into both nostrils 2 (two) times daily. Use in each nostril as directed  Memory changes  Irritable bowel syndrome with diarrhea -     dicyclomine (BENTYL) 10 MG capsule; Take 1 capsule (10 mg total) by mouth 4 (four) times daily -  before meals and at bedtime.  follow up in 1-2 months to follow up on concerns and see if medication is making improvements.   We may need to consider further work up of memory changes.   I have personally reviewed and noted the following in the patient's chart:    . Medical and social history . Use of alcohol, tobacco or illicit drugs  . Current medications and supplements . Functional ability and status . Nutritional status . Physical activity . Advanced directives . List of other physicians . Hospitalizations, surgeries, and ER visits in previous 12 months . Vitals . Screenings to include cognitive, depression, and falls . Referrals and appointments  In addition, I have reviewed and discussed with patient certain preventive protocols, quality metrics, and best practice recommendations. A written personalized care plan for preventive services as well as general preventive health recommendations were provided to patient.     Iran Planas, PA-C

## 2017-08-26 NOTE — Patient Instructions (Signed)

## 2017-08-27 LAB — COMPLETE METABOLIC PANEL WITH GFR
AG RATIO: 1.6 (calc) (ref 1.0–2.5)
ALT: 38 U/L (ref 9–46)
AST: 27 U/L (ref 10–35)
Albumin: 4.3 g/dL (ref 3.6–5.1)
Alkaline phosphatase (APISO): 92 U/L (ref 40–115)
BUN: 13 mg/dL (ref 7–25)
CO2: 25 mmol/L (ref 20–32)
Calcium: 10.3 mg/dL (ref 8.6–10.3)
Chloride: 109 mmol/L (ref 98–110)
Creat: 0.81 mg/dL (ref 0.70–1.25)
GFR, EST AFRICAN AMERICAN: 107 mL/min/{1.73_m2} (ref >=60)
GFR, EST NON AFRICAN AMERICAN: 92 mL/min/{1.73_m2} (ref >=60)
Globulin: 2.7 g/dL (calc) (ref 1.9–3.7)
Glucose, Bld: 106 mg/dL — ABNORMAL HIGH (ref 65–99)
POTASSIUM: 3.9 mmol/L (ref 3.5–5.3)
Sodium: 140 mmol/L (ref 135–146)
TOTAL PROTEIN: 7 g/dL (ref 6.1–8.1)
Total Bilirubin: 1 mg/dL (ref 0.2–1.2)

## 2017-08-27 LAB — TSH: TSH: 1.62 mIU/L (ref 0.40–4.50)

## 2017-08-27 LAB — HEMOGLOBIN A1C
EAG (MMOL/L): 5.7 (calc)
Hgb A1c MFr Bld: 5.2 % of total Hgb (ref <5.7)
Mean Plasma Glucose: 103 (calc)

## 2017-08-27 LAB — LITHIUM LEVEL: LITHIUM LVL: 0.5 mmol/L — AB (ref 0.6–1.2)

## 2017-08-30 ENCOUNTER — Encounter: Payer: Self-pay | Admitting: Physician Assistant

## 2017-08-30 DIAGNOSIS — R7301 Impaired fasting glucose: Secondary | ICD-10-CM

## 2017-08-30 DIAGNOSIS — R413 Other amnesia: Secondary | ICD-10-CM

## 2017-08-30 DIAGNOSIS — R05 Cough: Secondary | ICD-10-CM | POA: Insufficient documentation

## 2017-08-30 DIAGNOSIS — R059 Cough, unspecified: Secondary | ICD-10-CM | POA: Insufficient documentation

## 2017-08-30 HISTORY — DX: Other amnesia: R41.3

## 2017-08-30 HISTORY — DX: Impaired fasting glucose: R73.01

## 2017-08-30 MED ORDER — ROPINIROLE HCL 0.25 MG PO TABS: 0.2500 mg | ORAL_TABLET | Freq: Every day | ORAL | 2 refills | Status: DC

## 2017-08-31 ENCOUNTER — Encounter: Payer: Self-pay | Admitting: Emergency Medicine

## 2017-09-07 ENCOUNTER — Ambulatory Visit (INDEPENDENT_AMBULATORY_CARE_PROVIDER_SITE_OTHER): Payer: Medicare HMO | Admitting: Physician Assistant

## 2017-09-07 VITALS — BP 142/70 | HR 47 | Temp 97.9°F | Resp 14 | Wt 218.0 lb

## 2017-09-07 DIAGNOSIS — R600 Localized edema: Secondary | ICD-10-CM

## 2017-09-07 DIAGNOSIS — R05 Cough: Secondary | ICD-10-CM | POA: Diagnosis not present

## 2017-09-07 DIAGNOSIS — R197 Diarrhea, unspecified: Secondary | ICD-10-CM | POA: Diagnosis not present

## 2017-09-07 DIAGNOSIS — R0989 Other specified symptoms and signs involving the circulatory and respiratory systems: Secondary | ICD-10-CM

## 2017-09-07 DIAGNOSIS — R059 Cough, unspecified: Secondary | ICD-10-CM

## 2017-09-07 DIAGNOSIS — J989 Respiratory disorder, unspecified: Secondary | ICD-10-CM

## 2017-09-07 LAB — PULMONARY FUNCTION TEST

## 2017-09-07 MED ORDER — ALBUTEROL SULFATE (2.5 MG/3ML) 0.083% IN NEBU
2.5000 mg | INHALATION_SOLUTION | Freq: Once | RESPIRATORY_TRACT | Status: AC
  Administered 2017-09-07: 2.5 mg via RESPIRATORY_TRACT

## 2017-09-07 MED ORDER — MONTELUKAST SODIUM 10 MG PO TABS: 10.0000 mg | ORAL_TABLET | Freq: Every day | ORAL | 3 refills | Status: DC

## 2017-09-07 NOTE — Patient Instructions (Addendum)
Stop bentyl to see if vision changes resolve.  Start singulair to see if continues to improve your cough and congestion.  Continue astelin nasal spray.   Chronic Venous Insufficiency Chronic venous insufficiency, also called venous stasis, is a condition that prevents blood from being pumped effectively through the veins in your legs. Blood may no longer be pumped effectively from the legs back to the heart. This condition can range from mild to severe. With proper treatment, you should be able to continue with an active life. What are the causes? Chronic venous insufficiency occurs when the vein walls become stretched, weakened, or damaged, or when valves within the vein are damaged. Some common causes of this include:  High blood pressure inside the veins (venous hypertension).  Increased blood pressure in the leg veins from long periods of sitting or standing.  A blood clot that blocks blood flow in a vein (deep vein thrombosis, DVT).  Inflammation of a vein (phlebitis) that causes a blood clot to form.  Tumors in the pelvis that cause blood to back up.  What increases the risk? The following factors may make you more likely to develop this condition:  Having a family history of this condition.  Obesity.  Pregnancy.  Living without enough physical activity or exercise (sedentary lifestyle).  Smoking.  Having a job that requires long periods of standing or sitting in one place.  Being a certain age. Women in their 70s and 90s and men in their 80s are more likely to develop this condition.  What are the signs or symptoms? Symptoms of this condition include:  Veins that are enlarged, bulging, or twisted (varicose veins).  Skin breakdown or ulcers.  Reddened or discolored skin on the front of the leg.  Brown, smooth, tight, and painful skin just above the ankle, usually on the inside of the leg (lipodermatosclerosis).  Swelling.  How is this diagnosed? This condition  may be diagnosed based on:  Your medical history.  A physical exam.  Tests, such as: ? A procedure that creates an image of a blood vessel and nearby organs and provides information about blood flow through the blood vessel (duplex ultrasound). ? A procedure that tests blood flow (plethysmography). ? A procedure to look at the veins using X-ray and dye (venogram).  How is this treated? The goals of treatment are to help you return to an active life and to minimize pain or disability. Treatment depends on the severity of your condition, and it may include:  Wearing compression stockings. These can help relieve symptoms and help prevent your condition from getting worse. However, they do not cure the condition.  Sclerotherapy. This is a procedure involving an injection of a material that "dissolves" damaged veins.  Surgery. This may involve: ? Removing a diseased vein (vein stripping). ? Cutting off blood flow through the vein (laser ablation surgery). ? Repairing a valve.  Follow these instructions at home:  Wear compression stockings as told by your health care provider. These stockings help to prevent blood clots and reduce swelling in your legs.  Take over-the-counter and prescription medicines only as told by your health care provider.  Stay active by exercising, walking, or doing different activities. Ask your health care provider what activities are safe for you and how much exercise you need.  Drink enough fluid to keep your urine clear or pale yellow.  Do not use any products that contain nicotine or tobacco, such as cigarettes and e-cigarettes. If you need help quitting,  ask your health care provider.  Keep all follow-up visits as told by your health care provider. This is important. Contact a health care provider if:  You have redness, swelling, or more pain in the affected area.  You see a red streak or line that extends up or down from the affected area.  You have  skin breakdown or a loss of skin in the affected area, even if the breakdown is small.  You get an injury in the affected area. Get help right away if:  You get an injury and an open wound in the affected area.  You have severe pain that does not get better with medicine.  You have sudden numbness or weakness in the foot or ankle below the affected area, or you have trouble moving your foot or ankle.  You have a fever and you have worse or persistent symptoms.  You have chest pain.  You have shortness of breath. Summary  Chronic venous insufficiency, also called venous stasis, is a condition that prevents blood from being pumped effectively through the veins in your legs.  Chronic venous insufficiency occurs when the vein walls become stretched, weakened, or damaged, or when valves within the vein are damaged.  Treatment for this condition depends on how severe your condition is, and it may involve wearing compression stockings or having a procedure.  Make sure you stay active by exercising, walking, or doing different activities. Ask your health care provider what activities are safe for you and how much exercise you need. This information is not intended to replace advice given to you by your health care provider. Make sure you discuss any questions you have with your health care provider. Document Released: 11/24/2006 Document Revised: 06/09/2016 Document Reviewed: 06/09/2016 Elsevier Interactive Patient Education  2017 [employer].

## 2017-09-07 NOTE — Progress Notes (Signed)
Subjective:    Patient ID: Alexander [name], male    DOB: [birth year], 67 y.o.   MRN: [id number]  HPI  Pt is a 67 yo male with HTN, ongoing cough who presents to the clinic for spirometry.   He does feel like cough is much better today. He is using astelin and helping congestion and cough about 40 percent.   He is using bentyl and helping morning diarrhea by about 50 percent. He does feel like bentyl is giving him blurry vision. No eye pain.   He does mention off and on swelling of bilateral ankles. Resolves with sleeping and/or feet elevation. It is both legs effected. No pain or injury.   He does ask about if we have tested him for all the cancers. He wants any cancer screenings that we can do.   .. Active Ambulatory Problems    Diagnosis Date Noted  . Essential hypertension, benign 09/30/2013  . Barrett's esophagus 09/30/2013  . Impaired fasting glucose 09/30/2013  . BPH (benign prostatic hyperplasia) 09/30/2013  . Bipolar 1 disorder, mixed ([place]) 09/30/2013  . Generalized anxiety disorder 09/30/2013  . Hyperlipidemia 10/03/2013  . History of diverticulitis 10/20/2013  . Preventive measure 10/20/2013  . OSA on CPAP 12/28/2013  . Osteoarthritis of carpometacarpal joint of left thumb 01/05/2015  . Ear mass, left 06/06/2016  . Need for influenza vaccination 03/25/2017  . Chronic pain of right knee 03/25/2017  . Epidermal cyst 03/25/2017  . Seborrheic keratoses 03/25/2017  . RLS (restless legs syndrome) 03/25/2017  . Diarrhea 03/25/2017  . Hematoma 04/24/2017  . Excessive bleeding 04/24/2017  . Lipoma of neck 04/24/2017  . Acute non-recurrent pansinusitis 08/12/2017  . Memory changes 08/30/2017  . Elevated fasting glucose 08/30/2017  . Cough 08/30/2017  . Reactive airway disease that is not asthma 09/13/2017  . Bilateral lower extremity edema 09/13/2017   Resolved Ambulatory Problems    Diagnosis Date Noted  . No Resolved Ambulatory Problems   Past Medical History:   Diagnosis Date  . Barrett's esophagus 09/30/2013  . Bipolar 1 disorder, mixed ([place]) 09/30/2013  . BPH (benign prostatic hyperplasia) 09/30/2013  . Essential hypertension, benign 09/30/2013       Review of Systems  All other systems reviewed and are negative.      Objective:   Physical Exam  Constitutional: He is oriented to person, place, and time. He appears well-developed and well-nourished.  HENT:  Head: Normocephalic and atraumatic.  Cardiovascular: Normal rate, regular rhythm and normal heart sounds.  Pulmonary/Chest: Effort normal and breath sounds normal. He has no wheezes.  Neurological: He is alert and oriented to person, place, and time.  Psychiatric: He has a normal mood and affect. His behavior is normal.          Assessment & Plan:  [name] KitchenMarland KitchenElenore Rota [name] was seen today for shortness of breath and cough.  Diagnoses and all orders for this visit:  Cough -     PR EVAL OF BRONCHOSPASM -     albuterol (PROVENTIL) (2.5 MG/3ML) 0.083% nebulizer solution 2.5 mg -     montelukast (SINGULAIR) 10 MG tablet; Take 1 tablet (10 mg total) by mouth at bedtime.  Diarrhea, unspecified type  Reactive airway disease that is not asthma -     albuterol (PROVENTIL) (2.5 MG/3ML) 0.083% nebulizer solution 2.5 mg -     montelukast (SINGULAIR) 10 MG tablet; Take 1 tablet (10 mg total) by mouth at bedtime.  Bilateral lower extremity edema    09/2017 FEV1  94 percent, FEV1/FVC 79, FEF 25-75 change of 38 percent. No COPD found. No Asthma. There was a high percent change of small airways leading me to think could be some reactive airway disease. I gave pt rescue inhaler and started on singulair to see if that would continue to help with cough.   Unsure if bentyl is causing vision blurriness. Stop for 2 weeks and let me know. Could consider levbid if that is the cause to continue to improve morning diarrhea.   Discussed edema in legs. Appears more due to venous stasis. Encouraged compression  stockings ang frequent leg elevation. HO given. Watch salt intake.   [name].Spent 30 minutes with patient and greater than 50 percent of visit spent counseling patient regarding treatment plan.

## 2017-09-13 DIAGNOSIS — R600 Localized edema: Secondary | ICD-10-CM | POA: Insufficient documentation

## 2017-09-13 DIAGNOSIS — J989 Respiratory disorder, unspecified: Secondary | ICD-10-CM | POA: Insufficient documentation

## 2017-09-13 DIAGNOSIS — R0989 Other specified symptoms and signs involving the circulatory and respiratory systems: Secondary | ICD-10-CM | POA: Insufficient documentation

## 2017-09-13 HISTORY — DX: Respiratory disorder, unspecified: J98.9

## 2017-09-13 HISTORY — DX: Localized edema: R60.0

## 2017-10-07 ENCOUNTER — Ambulatory Visit (INDEPENDENT_AMBULATORY_CARE_PROVIDER_SITE_OTHER): Payer: Medicare HMO | Admitting: Physician Assistant

## 2017-10-07 ENCOUNTER — Encounter: Payer: Self-pay | Admitting: Physician Assistant

## 2017-10-07 VITALS — BP 144/60 | HR 63 | Ht 70.0 in | Wt 213.0 lb

## 2017-10-07 DIAGNOSIS — R6882 Decreased libido: Secondary | ICD-10-CM

## 2017-10-07 DIAGNOSIS — H539 Unspecified visual disturbance: Secondary | ICD-10-CM | POA: Diagnosis not present

## 2017-10-07 DIAGNOSIS — R69 Illness, unspecified: Secondary | ICD-10-CM | POA: Diagnosis not present

## 2017-10-07 DIAGNOSIS — I10 Essential (primary) hypertension: Secondary | ICD-10-CM

## 2017-10-07 DIAGNOSIS — K580 Irritable bowel syndrome with diarrhea: Secondary | ICD-10-CM

## 2017-10-07 MED ORDER — DICYCLOMINE HCL 10 MG PO CAPS: 10.0000 mg | ORAL_CAPSULE | Freq: Two times a day (BID) | ORAL | 3 refills | Status: DC

## 2017-10-07 NOTE — Progress Notes (Signed)
Subjective:     Patient ID: Alexander [name], male   DOB: 03/16/1951, 67 y.o.   MRN: [id number]  HPI   Alexander [name] is a 67 year old male with past medical history of Barrett Esophagitis, Bipolar 1, BPH, Essential HTN is presented to the office today with his follow up appointment.   Patient denies any acute changes since his last visit. Patient states that she is about to engage in an new relationship and wants to see if Alexander [name] can get supplemental testosterone. His libido is a little low. Alexander [name] reports Alexander [name] is still able to get an erection but does not know if it is "hard as it should be".   Patient states that Alexander [name] still feels that the floor is distorted when Alexander [name] looks down, right side vision is fuzzy. Patient states that symptoms of diarrhea improved, not having less volume of stool but still have one episode of diarrhea almost every day in the morning after breakfast. Patient states that Bentyl helped significantly to relieve his symptoms.  Patient states that Alexander [name] walks about 1.5 miles every day, uses his CPAP every night and takes all his medications as prescribed. Alexander [name] states that his mood is good and his symptoms of bipolar disorder is well controlled with current regimen. Patient denies fever, chills, night sweats, nausea, vomiting, constipation, chest pain, SOB, palpitation, coughing, hempotysis, wheezing. .. Active Ambulatory Problems    Diagnosis Date Noted  . Essential hypertension, benign 09/30/2013  . Barrett's esophagus 09/30/2013  . Impaired fasting glucose 09/30/2013  . BPH (benign prostatic hyperplasia) 09/30/2013  . Bipolar 1 disorder, mixed ([place]) 09/30/2013  . Generalized anxiety disorder 09/30/2013  . Hyperlipidemia 10/03/2013  . History of diverticulitis 10/20/2013  . Preventive measure 10/20/2013  . OSA on CPAP 12/28/2013  . Osteoarthritis of carpometacarpal joint of left thumb 01/05/2015  . Ear mass, left 06/06/2016  . Need for influenza vaccination 03/25/2017  . Chronic pain of  right knee 03/25/2017  . Epidermal cyst 03/25/2017  . Seborrheic keratoses 03/25/2017  . RLS (restless legs syndrome) 03/25/2017  . Diarrhea 03/25/2017  . Hematoma 04/24/2017  . Excessive bleeding 04/24/2017  . Lipoma of neck 04/24/2017  . Acute non-recurrent pansinusitis 08/12/2017  . Memory changes 08/30/2017  . Elevated fasting glucose 08/30/2017  . Cough 08/30/2017  . Reactive airway disease that is not asthma 09/13/2017  . Bilateral lower extremity edema 09/13/2017  . Low libido 10/08/2017  . Irritable bowel syndrome with diarrhea 10/08/2017  . Vision changes 10/08/2017   Resolved Ambulatory Problems    Diagnosis Date Noted  . No Resolved Ambulatory Problems   Past Medical History:  Diagnosis Date  . Barrett's esophagus 09/30/2013  . Bipolar 1 disorder, mixed ([place]) 09/30/2013  . BPH (benign prostatic hyperplasia) 09/30/2013  . Essential hypertension, benign 09/30/2013   Review of Systems   As stated in HPI.     Objective:   Physical Exam  Constitutional: Alexander [name] is oriented to person, place, and time. Alexander [name] appears well-developed and well-nourished. No distress.  HENT:  Head: Normocephalic and atraumatic.  Nose: Nose normal.  Eyes: Conjunctivae are normal. Pupils are equal, round, and reactive to light. Right eye exhibits no discharge. Left eye exhibits no discharge. No scleral icterus.  Neck: Normal range of motion. Neck supple. No tracheal deviation present. No thyromegaly present.  Cardiovascular: Normal rate, regular rhythm, normal heart sounds and intact distal pulses. Exam reveals no gallop and no friction rub.  No murmur heard. Pulmonary/Chest: Effort normal and breath sounds normal.  No respiratory distress. Alexander [name] has no wheezes. Alexander [name] has no rales. Alexander [name] exhibits no tenderness.  Abdominal: Soft. Bowel sounds are normal. Alexander [name] exhibits no distension and no mass. There is no tenderness. There is no rebound and no guarding.  Lymphadenopathy:    Alexander [name] has no cervical adenopathy.   Neurological: Alexander [name] is alert and oriented to person, place, and time. Coordination normal.  Psychiatric: Alexander [name] has a normal mood and affect. His behavior is normal. Judgment and thought content normal.        Assessment:     [name] KitchenMarland KitchenDiagnoses and all orders for this visit:  Vision changes  Irritable bowel syndrome with diarrhea -     dicyclomine (BENTYL) 10 MG capsule; Take 1 capsule (10 mg total) by mouth 2 (two) times daily.  Essential hypertension, benign  Low libido -     Testosterone      Plan:     Had discussion about effect of supplemental testosterone on erection/libido.pt is aware that testosterone will not likely effect your erection. Baseline testosterone is not available. Patient was schedule to to have lab drawn for serum testosterone.   Patient was advised to consult ophthalmologist for his eye complains. It can be secondary to side effects from Bentyl.However detailed fundoscopy exam is necessary to rule out other etiology.right now seems like benefits of bentyl outweigh side effects.   Other pharmacological option for diarrhea  is discussed with patient.  Patient believes that Bentyl is working well and do not want to make any changes at this time.   Patient was encouraged to reduce sugar and carb in his diet base on his last A1C. Patient was encourage to receive pneumococcal 23 and Shingrix vaccine. Patient to check with pharmacy. Blood pressure rechecked, 144/72 mmHg. Patient was encouraged to follow low salt diet.   Follow up in 6 month sooner if we decided to supplement with testosterone.

## 2017-10-07 NOTE — Patient Instructions (Addendum)
Needs eye exam.  Continue bentyl.  Get testosterone checked.   pneumococcal 23 shingles(shingrix 2 dose series).

## 2017-10-08 ENCOUNTER — Encounter: Payer: Self-pay | Admitting: Physician Assistant

## 2017-10-08 DIAGNOSIS — K580 Irritable bowel syndrome with diarrhea: Secondary | ICD-10-CM | POA: Insufficient documentation

## 2017-10-08 DIAGNOSIS — R6882 Decreased libido: Secondary | ICD-10-CM

## 2017-10-08 DIAGNOSIS — H539 Unspecified visual disturbance: Secondary | ICD-10-CM | POA: Insufficient documentation

## 2017-10-08 HISTORY — DX: Irritable bowel syndrome with diarrhea: K58.0

## 2017-10-08 HISTORY — DX: Decreased libido: R68.82

## 2017-10-08 LAB — TESTOSTERONE: Testosterone: 382 ng/dL (ref 250–827)

## 2017-10-08 NOTE — Progress Notes (Signed)
Call pt: testosterone is actually not in the deficient range it is on the low normal side. We could recheck again in 2 weeks and see if any changes. I would suggest maybe all you need is a testosterone booster OTC. Once try that we could test and see how beneficial it is. If you are having trouble maintaining erection then there is medication for that. Testosterone does not help with erections.

## 2017-10-09 ENCOUNTER — Other Ambulatory Visit: Payer: Self-pay | Admitting: *Deleted

## 2017-10-09 DIAGNOSIS — R6882 Decreased libido: Secondary | ICD-10-CM

## 2017-10-22 ENCOUNTER — Telehealth: Payer: Self-pay | Admitting: Physician Assistant

## 2017-10-22 DIAGNOSIS — R69 Illness, unspecified: Secondary | ICD-10-CM | POA: Diagnosis not present

## 2017-10-22 NOTE — Telephone Encounter (Signed)
Tried calling pt no answer

## 2017-10-22 NOTE — Telephone Encounter (Signed)
Alexander [name] stopped in today to talk with you. He did not want to leave a message. He said that it was personal. He asked if you could give him a call.

## 2017-10-23 LAB — TESTOSTERONE: Testosterone: 488 ng/dL (ref 250–827)

## 2017-10-23 NOTE — Progress Notes (Signed)
Call pt: 2nd testosterone was much higher than the first 2 weeks ago. You do not need testosterone replacement. You could consider OTC testosterone booster. If having problems with erection we can treat that with other medications so let us know.

## 2017-10-29 ENCOUNTER — Other Ambulatory Visit: Payer: Self-pay | Admitting: Physician Assistant

## 2017-10-29 DIAGNOSIS — M25561 Pain in right knee: Principal | ICD-10-CM

## 2017-10-29 DIAGNOSIS — G8929 Other chronic pain: Secondary | ICD-10-CM

## 2017-11-09 ENCOUNTER — Other Ambulatory Visit: Payer: Self-pay | Admitting: Physician Assistant

## 2017-11-09 DIAGNOSIS — I10 Essential (primary) hypertension: Secondary | ICD-10-CM

## 2017-11-21 ENCOUNTER — Other Ambulatory Visit: Payer: Self-pay | Admitting: Physician Assistant

## 2017-11-22 ENCOUNTER — Other Ambulatory Visit: Payer: Self-pay | Admitting: Physician Assistant

## 2017-11-22 DIAGNOSIS — G2581 Restless legs syndrome: Secondary | ICD-10-CM

## 2017-11-25 ENCOUNTER — Other Ambulatory Visit: Payer: Self-pay | Admitting: Physician Assistant

## 2017-12-28 ENCOUNTER — Other Ambulatory Visit: Payer: Self-pay | Admitting: Physician Assistant

## 2017-12-28 DIAGNOSIS — K580 Irritable bowel syndrome with diarrhea: Secondary | ICD-10-CM

## 2017-12-29 ENCOUNTER — Other Ambulatory Visit: Payer: Self-pay | Admitting: Physician Assistant

## 2017-12-29 DIAGNOSIS — G8929 Other chronic pain: Secondary | ICD-10-CM

## 2017-12-29 DIAGNOSIS — M25561 Pain in right knee: Principal | ICD-10-CM

## 2018-01-27 ENCOUNTER — Encounter: Payer: Self-pay | Admitting: Physician Assistant

## 2018-01-27 ENCOUNTER — Ambulatory Visit (INDEPENDENT_AMBULATORY_CARE_PROVIDER_SITE_OTHER): Payer: Medicare HMO

## 2018-01-27 ENCOUNTER — Ambulatory Visit (INDEPENDENT_AMBULATORY_CARE_PROVIDER_SITE_OTHER): Payer: Medicare HMO | Admitting: Physician Assistant

## 2018-01-27 VITALS — BP 134/78 | HR 95 | Wt 208.0 lb

## 2018-01-27 DIAGNOSIS — R195 Other fecal abnormalities: Secondary | ICD-10-CM | POA: Diagnosis not present

## 2018-01-27 DIAGNOSIS — I10 Essential (primary) hypertension: Secondary | ICD-10-CM | POA: Diagnosis not present

## 2018-01-27 DIAGNOSIS — I517 Cardiomegaly: Secondary | ICD-10-CM | POA: Insufficient documentation

## 2018-01-27 DIAGNOSIS — K2270 Barrett's esophagus without dysplasia: Secondary | ICD-10-CM

## 2018-01-27 DIAGNOSIS — M4802 Spinal stenosis, cervical region: Secondary | ICD-10-CM | POA: Diagnosis not present

## 2018-01-27 DIAGNOSIS — R17 Unspecified jaundice: Secondary | ICD-10-CM | POA: Insufficient documentation

## 2018-01-27 DIAGNOSIS — E782 Mixed hyperlipidemia: Secondary | ICD-10-CM | POA: Diagnosis not present

## 2018-01-27 DIAGNOSIS — I499 Cardiac arrhythmia, unspecified: Secondary | ICD-10-CM | POA: Insufficient documentation

## 2018-01-27 DIAGNOSIS — F419 Anxiety disorder, unspecified: Secondary | ICD-10-CM | POA: Diagnosis not present

## 2018-01-27 DIAGNOSIS — M47812 Spondylosis without myelopathy or radiculopathy, cervical region: Secondary | ICD-10-CM

## 2018-01-27 DIAGNOSIS — G2581 Restless legs syndrome: Secondary | ICD-10-CM | POA: Diagnosis not present

## 2018-01-27 DIAGNOSIS — M542 Cervicalgia: Secondary | ICD-10-CM

## 2018-01-27 HISTORY — DX: Cardiac arrhythmia, unspecified: I49.9

## 2018-01-27 HISTORY — DX: Hypercalcemia: E83.52

## 2018-01-27 HISTORY — DX: Cardiomegaly: I51.7

## 2018-01-27 HISTORY — DX: Cervicalgia: M54.2

## 2018-01-27 HISTORY — DX: Other fecal abnormalities: R19.5

## 2018-01-27 MED ORDER — DOXAZOSIN MESYLATE 4 MG PO TABS: 4.0000 mg | ORAL_TABLET | Freq: Two times a day (BID) | ORAL | 1 refills | Status: DC

## 2018-01-27 MED ORDER — CLONAZEPAM 0.5 MG PO TABS: 0.5000 mg | ORAL_TABLET | Freq: Two times a day (BID) | ORAL | 5 refills | Status: DC | PRN

## 2018-01-27 MED ORDER — CYCLOBENZAPRINE HCL 10 MG PO TABS: 10.0000 mg | ORAL_TABLET | Freq: Three times a day (TID) | ORAL | 0 refills | Status: DC | PRN

## 2018-01-27 MED ORDER — BENAZEPRIL HCL 20 MG PO TABS: 20.0000 mg | ORAL_TABLET | Freq: Every day | ORAL | 1 refills | Status: DC

## 2018-01-27 MED ORDER — AMLODIPINE BESYLATE 10 MG PO TABS: ORAL_TABLET | ORAL | 1 refills | Status: DC

## 2018-01-27 MED ORDER — PANTOPRAZOLE SODIUM 40 MG PO TBEC: DELAYED_RELEASE_TABLET | ORAL | 3 refills | Status: DC

## 2018-01-27 MED ORDER — ROPINIROLE HCL 0.25 MG PO TABS: 0.2500 mg | ORAL_TABLET | Freq: Every day | ORAL | 2 refills | Status: DC

## 2018-01-27 MED ORDER — ATORVASTATIN CALCIUM 20 MG PO TABS: 20.0000 mg | ORAL_TABLET | Freq: Every day | ORAL | 1 refills | Status: DC

## 2018-01-27 NOTE — Patient Instructions (Signed)
Musculoskeletal Pain  Musculoskeletal pain is muscle and bone aches and pains. This pain can occur in any part of the body.  Follow these instructions at home:  · Only take medicines for pain, discomfort, or fever as told by your health care provider.  · You may continue all activities unless the activities cause more pain. When the pain lessens, slowly resume normal activities. Gradually increase the intensity and duration of the activities or exercise.  · During periods of severe pain, bed rest may be helpful. Lie or sit in any position that is comfortable, but get out of bed and walk around at least every several hours.  · If directed, put ice on the injured area.  ? Put ice in a plastic bag.  ? Place a towel between your skin and the bag.  ? Leave the ice on for 20 minutes, 2-3 times a day.  Contact a health care provider if:  · Your pain is getting worse.  · Your pain is not relieved with medicines.  · You lose function in the area of the pain if the pain is in your arms, legs, or neck.  This information is not intended to replace advice given to you by your health care provider. Make sure you discuss any questions you have with your health care provider.  Document Released: 07/21/2005 Document Revised: 01/01/2016 Document Reviewed: 03/25/2013  Elsevier Interactive Patient Education © 2017 [employer]

## 2018-01-27 NOTE — Progress Notes (Signed)
Subjective:    Patient ID: Alexander [name], male    DOB: 09-Apr-1951, 67 y.o.   MRN: [id number]  HPI  Patient is a 67 yo male with a pmhx of HTN, Bipolar I, GAD, and IBS-D presenting to clinic today for neck pain and diarrhea.  Diarrhea - He has loose stools every morning for the past several months. Loose stools only occur in the morning and usually following his first meal. He has used Bentyl in the past for IBS-D but has not taken it for 2 months. He states it did not help much with his symptoms.  He denies abdominal pain or medication changes coinciding with diarrhea onset. He denies melena, hematochezia.  Neck pain - He has neck pain that is constant at 3/10 but worsened with certain movements. The left side of the neck is worse than the right. He has pain at night and has tried several different kinds of pillows without alleviation. He describes audible popping and grinding with movements of his neck. In the past he has had shoulder pain as well but currently the pain is localized to his neck. He states he is very active. He denies numbness or paresthesias in bilateral upper extremities.  Abdominal lesion - He has a lesion on his right lower abdomen that is sore. He first noticed it a few days ago It has overall improved since then.does not hurt or itch.   Denies SHOB, CP, palpitations.  He needs medications refilled.  Review of Systems  All other systems reviewed and are negative.  .. Active Ambulatory Problems    Diagnosis Date Noted  . Essential hypertension, benign 09/30/2013  . Barrett's esophagus 09/30/2013  . Impaired fasting glucose 09/30/2013  . BPH (benign prostatic hyperplasia) 09/30/2013  . Bipolar 1 disorder, mixed ([place]) 09/30/2013  . Generalized anxiety disorder 09/30/2013  . Hyperlipidemia 10/03/2013  . History of diverticulitis 10/20/2013  . Preventive measure 10/20/2013  . OSA on CPAP 12/28/2013  . Osteoarthritis of carpometacarpal joint of left thumb  01/05/2015  . Ear mass, left 06/06/2016  . Need for influenza vaccination 03/25/2017  . Chronic pain of right knee 03/25/2017  . Epidermal cyst 03/25/2017  . Seborrheic keratoses 03/25/2017  . RLS (restless legs syndrome) 03/25/2017  . Diarrhea 03/25/2017  . Hematoma 04/24/2017  . Excessive bleeding 04/24/2017  . Lipoma of neck 04/24/2017  . Acute non-recurrent pansinusitis 08/12/2017  . Memory changes 08/30/2017  . Elevated fasting glucose 08/30/2017  . Cough 08/30/2017  . Reactive airway disease that is not asthma 09/13/2017  . Bilateral lower extremity edema 09/13/2017  . Low libido 10/08/2017  . Irritable bowel syndrome with diarrhea 10/08/2017  . Vision changes 10/08/2017  . Irregular heart beat 01/27/2018  . LVH (left ventricular hypertrophy) 01/27/2018  . Neck pain 01/27/2018  . Serum calcium elevated 01/27/2018  . Elevated bilirubin 01/27/2018  . Loose stools 01/27/2018   Resolved Ambulatory Problems    Diagnosis Date Noted  . No Resolved Ambulatory Problems   Past Medical History:  Diagnosis Date  . Barrett's esophagus 09/30/2013  . Bipolar 1 disorder, mixed ([place]) 09/30/2013  . BPH (benign prostatic hyperplasia) 09/30/2013  . Essential hypertension, benign 09/30/2013       Objective:   Physical Exam  Constitutional: He is oriented to person, place, and time. He appears well-developed and well-nourished. No distress.  HENT:  Head: Normocephalic and atraumatic.  Neck: Neck supple. Muscular tenderness present. No spinous process tenderness present. No neck rigidity. No edema and normal range  of motion present.  Tenderness to palpation of cervical paraspinal muscles. Pain with rotation.  Cardiovascular: S1 normal and S2 normal. Bradycardia present.  Bradycardia with occasional delayed beat. Radial pulses 2+ with irregular rate.  Pulmonary/Chest: Effort normal and breath sounds normal.  Abdominal: Soft.  Musculoskeletal:       Right shoulder: He exhibits no spasm.   Musculature of shoulders palpably tight   Neurological: He is alert and oriented to person, place, and time.  Skin: Skin is warm and dry. He is not diaphoretic.     Psychiatric: He has a normal mood and affect. His behavior is normal.  Vitals reviewed.   .. Vitals:   01/27/18 1007 01/27/18 1037  BP: (!) 142/62 134/78  Pulse: 95   SpO2: 96%        Assessment & Plan:  [name] KitchenMarland KitchenElenore Rota [name] was seen today for diarrhea.  Diagnoses and all orders for this visit:  Neck pain -     DG Cervical Spine Complete -     cyclobenzaprine (FLEXERIL) 10 MG tablet; Take 1 tablet (10 mg total) by mouth 3 (three) times daily as needed for muscle spasms.  Elevated bilirubin -     COMPLETE METABOLIC PANEL WITH GFR  Serum calcium elevated -     COMPLETE METABOLIC PANEL WITH GFR  Loose stools  Essential hypertension, benign -     benazepril (LOTENSIN) 20 MG tablet; Take 1 tablet (20 mg total) by mouth daily. -     doxazosin (CARDURA) 4 MG tablet; Take 1 tablet (4 mg total) by mouth 2 (two) times daily. -     amLODipine (NORVASC) 10 MG tablet; TAKE 1 TABLET (10 MG TOTAL) BY MOUTH DAILY.  Barrett's esophagus without dysplasia -     pantoprazole (PROTONIX) 40 MG tablet; TAKE 1 TABLET (40 MG TOTAL) BY MOUTH 2 (TWO) TIMES DAILY.  Mixed hyperlipidemia -     atorvastatin (LIPITOR) 20 MG tablet; Take 1 tablet (20 mg total) by mouth daily.  RLS (restless legs syndrome) -     rOPINIRole (REQUIP) 0.25 MG tablet; Take 1 tablet (0.25 mg total) by mouth at bedtime. For restless legs.  Irregular heart beat  LVH (left ventricular hypertrophy)  Anxiety -     clonazePAM (KLONOPIN) 0.5 MG tablet; Take 1 tablet (0.5 mg total) by mouth 2 (two) times daily as needed for anxiety.  Lesion - Reassurance given as well as return precautions for increased erythema, drainage, heat, or pain.  Neck pain - Discussed this is likely a musculoskeletal problem and very low suspicion for neurologic involvement given lack of  neurological symptoms. Recommended PT and massage though he does not want these at this time. Encouraged neck exercises and HO given.He is already using meloxicam daily and can use ice or heat for symptom relief. X-ray today to rule out bony abnormalities.flexeril given. Discussed risk of sedation.   Diarrhea - Not concerned for an acute/infectious etiology given the chronicity. Suspect normal physiology or related to IBS-D and will refill Bentyl. Also advised trial of taking zoloft at night instead of morning to see if that helps. He will keep track of foods to see if any specific foods are contributing. He will follow up if symptoms worsen.  Irregular heart rate - Bradycardia and irregular rhythm on exam. He is not symptomatic today. EKG shows bradycardia and LVH but no PVCs, depressions, elevations, or block. We will continue to monitor this. Patient instructed to call or return if symptoms occur including palpitations, chest pain, syncope,  dizziness.  Will recheck some labs today to follow up on some lab abnormalities.

## 2018-01-28 ENCOUNTER — Other Ambulatory Visit: Payer: Self-pay | Admitting: Physician Assistant

## 2018-01-28 DIAGNOSIS — R17 Unspecified jaundice: Secondary | ICD-10-CM | POA: Diagnosis not present

## 2018-01-28 NOTE — Addendum Note (Signed)
Addended by: Huel Cote on: 01/28/2018 10:56 AM   Modules accepted: Orders

## 2018-01-29 ENCOUNTER — Encounter: Payer: Self-pay | Admitting: Physician Assistant

## 2018-01-29 DIAGNOSIS — M5030 Other cervical disc degeneration, unspecified cervical region: Secondary | ICD-10-CM

## 2018-01-29 HISTORY — DX: Other cervical disc degeneration, unspecified cervical region: M50.30

## 2018-01-29 LAB — COMPREHENSIVE METABOLIC PANEL
ALT: 25 IU/L (ref 0–44)
AST: 21 IU/L (ref 0–40)
Albumin/Globulin Ratio: 1.9 (ref 1.2–2.2)
Albumin: 4.4 g/dL (ref 3.6–4.8)
Alkaline Phosphatase: 80 IU/L (ref 39–117)
BUN/Creatinine Ratio: 14 (ref 10–24)
BUN: 13 mg/dL (ref 8–27)
Bilirubin Total: 1.1 mg/dL (ref 0.0–1.2)
CALCIUM: 10.4 mg/dL — AB (ref 8.6–10.2)
CO2: 21 mmol/L (ref 20–29)
CREATININE: 0.9 mg/dL (ref 0.76–1.27)
Chloride: 105 mmol/L (ref 96–106)
GFR, EST AFRICAN AMERICAN: 102 mL/min/{1.73_m2} (ref >59)
GFR, EST NON AFRICAN AMERICAN: 88 mL/min/{1.73_m2} (ref >59)
GLUCOSE: 104 mg/dL — AB (ref 65–99)
Globulin, Total: 2.3 g/dL (ref 1.5–4.5)
Potassium: 4.5 mmol/L (ref 3.5–5.2)
Sodium: 139 mmol/L (ref 134–144)
TOTAL PROTEIN: 6.7 g/dL (ref 6.0–8.5)

## 2018-01-29 LAB — SPECIMEN STATUS REPORT

## 2018-01-29 NOTE — Progress Notes (Signed)
Can we add PTH and vitamin D to labs due to elevated calcium.

## 2018-01-29 NOTE — Progress Notes (Signed)
Call pt: you do have a slight curve to the right and some degenerative changes worse on left.  NSAIDs,(your mobic) PT(start with home first), flexeril(muscle relaxer) and then follow up with sports med or consider formal PT.

## 2018-02-09 NOTE — Progress Notes (Signed)
Ok call patient with results. Have him com in to draw PTH and vitamin D. Thanks.

## 2018-02-15 DIAGNOSIS — R69 Illness, unspecified: Secondary | ICD-10-CM | POA: Diagnosis not present

## 2018-02-17 ENCOUNTER — Other Ambulatory Visit: Payer: Self-pay | Admitting: *Deleted

## 2018-02-17 NOTE — Progress Notes (Signed)
Additional labs printed and will fax to [facility]. Pt request to have labs done there. KG LPN

## 2018-02-19 ENCOUNTER — Other Ambulatory Visit: Payer: Self-pay

## 2018-02-20 LAB — VITAMIN D 25 HYDROXY (VIT D DEFICIENCY, FRACTURES): VIT D 25 HYDROXY: 31.9 ng/mL (ref 30.0–100.0)

## 2018-02-20 LAB — PTH, INTACT AND CALCIUM
CALCIUM: 10.7 mg/dL — AB (ref 8.6–10.2)
PTH: 17 pg/mL (ref 15–65)

## 2018-02-21 NOTE — Progress Notes (Signed)
Likely calcium elevated a little due to low vitamin D. Start D3 2000 units daily and will recheck labs in 2-3 months to see if calcium has normalized.

## 2018-04-16 ENCOUNTER — Other Ambulatory Visit: Payer: Self-pay | Admitting: Physician Assistant

## 2018-04-16 DIAGNOSIS — M25561 Pain in right knee: Principal | ICD-10-CM

## 2018-04-16 DIAGNOSIS — G8929 Other chronic pain: Secondary | ICD-10-CM

## 2018-04-26 ENCOUNTER — Ambulatory Visit (INDEPENDENT_AMBULATORY_CARE_PROVIDER_SITE_OTHER): Payer: Medicare HMO | Admitting: Physician Assistant

## 2018-04-26 ENCOUNTER — Encounter: Payer: Self-pay | Admitting: Physician Assistant

## 2018-04-26 VITALS — BP 103/73 | HR 72 | Ht 70.0 in | Wt 204.0 lb

## 2018-04-26 DIAGNOSIS — Z23 Encounter for immunization: Secondary | ICD-10-CM

## 2018-04-26 DIAGNOSIS — M47812 Spondylosis without myelopathy or radiculopathy, cervical region: Secondary | ICD-10-CM

## 2018-04-26 DIAGNOSIS — G8929 Other chronic pain: Secondary | ICD-10-CM

## 2018-04-26 DIAGNOSIS — M5030 Other cervical disc degeneration, unspecified cervical region: Secondary | ICD-10-CM

## 2018-04-26 DIAGNOSIS — Q828 Other specified congenital malformations of skin: Secondary | ICD-10-CM

## 2018-04-26 DIAGNOSIS — G4733 Obstructive sleep apnea (adult) (pediatric): Secondary | ICD-10-CM | POA: Diagnosis not present

## 2018-04-26 DIAGNOSIS — Z9989 Dependence on other enabling machines and devices: Secondary | ICD-10-CM | POA: Diagnosis not present

## 2018-04-26 DIAGNOSIS — R197 Diarrhea, unspecified: Secondary | ICD-10-CM

## 2018-04-26 DIAGNOSIS — I10 Essential (primary) hypertension: Secondary | ICD-10-CM

## 2018-04-26 DIAGNOSIS — M25561 Pain in right knee: Secondary | ICD-10-CM

## 2018-04-26 MED ORDER — DOXAZOSIN MESYLATE 4 MG PO TABS: 4.0000 mg | ORAL_TABLET | Freq: Two times a day (BID) | ORAL | 1 refills | Status: DC

## 2018-04-26 MED ORDER — MELOXICAM 15 MG PO TABS: 15.0000 mg | ORAL_TABLET | Freq: Every day | ORAL | 1 refills | Status: DC

## 2018-04-26 MED ORDER — HYOSCYAMINE SULFATE ER 0.375 MG PO TB12: 0.3750 mg | ORAL_TABLET | Freq: Two times a day (BID) | ORAL | 1 refills | Status: DC

## 2018-04-26 MED ORDER — BENAZEPRIL HCL 20 MG PO TABS: 20.0000 mg | ORAL_TABLET | Freq: Every day | ORAL | 1 refills | Status: DC

## 2018-04-26 NOTE — Patient Instructions (Signed)
1. You can call CPAP company and request a new machine. I am more than happy to send any order if they need it.  2. You are going to start levbid twice a day but AT least in the morning before breakfast.  3. Continue on mobic. Will refer to formal PT and ortho to consider injections.

## 2018-04-26 NOTE — Progress Notes (Signed)
Subjective:    Patient ID: Alexander [name], male    DOB: 1951/05/21, 67 y.o.   MRN: [id number]  HPI  Pt is a 67 yo male with HTN, LVH, IBS, cervical DDD, Bipolar I, OSA  who presents to the clinic for follow up.   HtN- doing well. No concerns. Taking medication. No CP, palpitations, headaches, dizziness or vision changes.  Pt does have CPAP but machine is old. He wonders how to get a new one.   He continues to have left sided neck pain. ROM seems to be decreasing. He is taking mobic daily. He has done some home exercises with little relief. He is having pain even at night sleeping. xrays showed degenrative arthritic changes.   Pt continues to have diarrhea only in am. Abdominal pain is relieved by bowel movement. Does not occur after 10. Has diarrhea regardless of eating or not.   He would like skin tags removed today. They get irritated really easily.   .. Active Ambulatory Problems    Diagnosis Date Noted  . Essential hypertension, benign 09/30/2013  . Barrett's esophagus 09/30/2013  . Impaired fasting glucose 09/30/2013  . BPH (benign prostatic hyperplasia) 09/30/2013  . Bipolar 1 disorder, mixed ([place]) 09/30/2013  . Generalized anxiety disorder 09/30/2013  . Hyperlipidemia 10/03/2013  . History of diverticulitis 10/20/2013  . Preventive measure 10/20/2013  . OSA on CPAP 12/28/2013  . Osteoarthritis of carpometacarpal joint of left thumb 01/05/2015  . Ear mass, left 06/06/2016  . Need for influenza vaccination 03/25/2017  . Chronic pain of right knee 03/25/2017  . Epidermal cyst 03/25/2017  . Seborrheic keratoses 03/25/2017  . RLS (restless legs syndrome) 03/25/2017  . Diarrhea 03/25/2017  . Hematoma 04/24/2017  . Excessive bleeding 04/24/2017  . Lipoma of neck 04/24/2017  . Acute non-recurrent pansinusitis 08/12/2017  . Memory changes 08/30/2017  . Elevated fasting glucose 08/30/2017  . Cough 08/30/2017  . Reactive airway disease that is not asthma 09/13/2017  .  Bilateral lower extremity edema 09/13/2017  . Low libido 10/08/2017  . Irritable bowel syndrome with diarrhea 10/08/2017  . Vision changes 10/08/2017  . Irregular heart beat 01/27/2018  . LVH (left ventricular hypertrophy) 01/27/2018  . Neck pain 01/27/2018  . Serum calcium elevated 01/27/2018  . Elevated bilirubin 01/27/2018  . Loose stools 01/27/2018  . DDD (degenerative disc disease), cervical 01/29/2018  . Osteoarthritis of facet joint of cervical spine 04/26/2018   Resolved Ambulatory Problems    Diagnosis Date Noted  . No Resolved Ambulatory Problems   No Additional Past Medical History      Review of Systems  All other systems reviewed and are negative.      Objective:   Physical Exam  Constitutional: He is oriented to person, place, and time. He appears well-developed and well-nourished.  HENT:  Head: Normocephalic and atraumatic.  Right Ear: External ear normal.  Left Ear: External ear normal.  Eyes: Conjunctivae are normal.  Cardiovascular: Normal rate and regular rhythm.  Pulmonary/Chest: Effort normal and breath sounds normal.  Abdominal: Soft. Bowel sounds are normal. He exhibits no distension. There is no tenderness. There is no guarding.  Musculoskeletal:  Decrease ROM of neck. Left sided neck pain and tightness of paraspinal muscles.   Lymphadenopathy:    He has no cervical adenopathy.  Neurological: He is alert and oriented to person, place, and time.  Psychiatric: He has a normal mood and affect. His behavior is normal.          Assessment &  Plan:  ..Diagnoses and all orders for this visit:  Essential hypertension, benign -     benazepril (LOTENSIN) 20 MG tablet; Take 1 tablet (20 mg total) by mouth daily. -     doxazosin (CARDURA) 4 MG tablet; Take 1 tablet (4 mg total) by mouth 2 (two) times daily.  DDD (degenerative disc disease), cervical -     Ambulatory referral to Physical Therapy  Osteoarthritis of facet joint of cervical spine -      Ambulatory referral to Physical Therapy  OSA on CPAP  Chronic pain of right knee -     meloxicam (MOBIC) 15 MG tablet; Take 1 tablet (15 mg total) by mouth daily.  Serum calcium elevated -     BASIC METABOLIC PANEL WITH GFR  Need for immunization against influenza -     Flu Vaccine QUAD 36+ mos IM  Need for pneumococcal vaccination -     Pneumococcal polysaccharide vaccine 23-valent greater than or equal to 2yo subcutaneous/IM  Diarrhea, unspecified type -     hyoscyamine (LEVBID) 0.375 MG 12 hr tablet; Take 1 tablet (0.375 mg total) by mouth 2 (two) times daily.   Will get scheduled for formal PT. If not improving consider referral to ortho for injections. Continue mobic.   Call [facility] and see if time or insurance will pay for new machine. If need order more than happy to provide.   For diarrhea consider levbid bid. Follow up in 3 months.   Will recheck calcium today after starting vitamin D.   Skin Tag Removal Procedure Note  Pre-operative Diagnosis: Classic skin tags (acrochordon)  Post-operative Diagnosis: Classic skin tags (acrochordon)  Locations:around neck and bilateral axilla  Indications: irritation and bleeding Procedure Details  The risks (including bleeding and infection) and benefits of the procedure and Verbal informed consent obtained. Using sterile iris scissors, multiple skin tags were snipped off at their bases after cleansing with Betadine.  Bleeding was controlled by pressure.   Findings: Pathognomonic benign lesions  not sent for pathological exam.  Condition: Stable  Complications: none.  Plan: 1. Instructed to keep the wounds dry and covered for 24-48h and clean thereafter. 2. Warning signs of infection were reviewed.   3. Recommended that the patient use OTC acetaminophen as needed for pain.  4. Return as needed.

## 2018-04-28 ENCOUNTER — Encounter: Payer: Self-pay | Admitting: Physician Assistant

## 2018-04-28 DIAGNOSIS — Q828 Other specified congenital malformations of skin: Secondary | ICD-10-CM

## 2018-04-28 HISTORY — DX: Other specified congenital malformations of skin: Q82.8

## 2018-05-04 ENCOUNTER — Ambulatory Visit: Payer: Medicare HMO | Admitting: Rehabilitative and Restorative Service Providers"

## 2018-05-05 ENCOUNTER — Ambulatory Visit: Payer: Medicare HMO | Admitting: Rehabilitative and Restorative Service Providers"

## 2018-05-05 ENCOUNTER — Encounter: Payer: Self-pay | Admitting: Rehabilitative and Restorative Service Providers"

## 2018-05-05 DIAGNOSIS — R293 Abnormal posture: Secondary | ICD-10-CM

## 2018-05-05 DIAGNOSIS — M542 Cervicalgia: Secondary | ICD-10-CM | POA: Diagnosis not present

## 2018-05-05 DIAGNOSIS — R29898 Other symptoms and signs involving the musculoskeletal system: Secondary | ICD-10-CM

## 2018-05-05 NOTE — Therapy (Addendum)
[facility] [place], [place], [address] Phone: [phone]   Fax:  [phone]  Physical Therapy Evaluation  Patient Details  Name: QUAY SIMKIN MRN: [id number] Date of Birth: July 22, 1951 Referring Provider (PT): Iran Planas PA-C   Encounter Date: 05/05/2018  PT End of Session - 05/05/18 1159    Visit Number  1    Number of Visits  12    Date for PT Re-Evaluation  06/16/18    PT Start Time  1007    PT Stop Time  1112    PT Time Calculation (min)  57 min    Activity Tolerance  Patient tolerated treatment well       Past Medical History:  Diagnosis Date  . Barrett's esophagus 09/30/2013  . Bipolar 1 disorder, mixed ([place]) 09/30/2013   [facility], Dr. Ardine Eng   . BPH (benign prostatic hyperplasia) 09/30/2013  . Essential hypertension, benign 09/30/2013    Past Surgical History:  Procedure Laterality Date  . COLOSTOMY REVERSAL    . large bowel perforation      There were no vitals filed for this visit.   Subjective Assessment - 05/05/18 1028    Subjective  Patient report that he has had Lt sided neck pain for the past 4-5 months with no known injury.     Pertinent History  extensive abdominal surgeries 2007; bipolar; depression; HTN     Diagnostic tests  xrays     Patient Stated Goals  get rid of the neck pain     Currently in Pain?  Yes    Pain Score  5     Pain Location  Neck    Pain Orientation  Left    Pain Descriptors / Indicators  --   pain    Pain Type  Acute pain    Pain Onset  More than a month ago    Pain Frequency  Intermittent    Aggravating Factors   turning head to Lt and looking up    Pain Relieving Factors  holding head still          [facility] PT Assessment - 05/05/18 0001      Assessment   Medical Diagnosis  Cervicalgia    Referring Provider (PT)  Iran Planas PA-C    Onset Date/Surgical Date  12/02/17    Hand Dominance  Right    Next MD Visit  PRN     Prior  Therapy  none       Precautions   Precautions  None      Balance Screen   Has the patient fallen in the past 6 months  Yes    How many times?  1    Has the patient had a decrease in activity level because of a fear of falling?   No    Is the patient reluctant to leave their home because of a fear of falling?   No      Prior Function   Level of Independence  Independent    Vocation  Retired    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     Leisure  yard work; metal work in garage just finished a Best boy started on a sod sorter       Observation/Other Assessments   Focus on Therapeutic Outcomes (FOTO)   35% limitation       Sensation  Additional Comments  WFL's per pt report       Posture/Postural Control   Posture Comments  head forward; shoulders rounded and elevated; head of the humerus anterior in orientation; scapulae abducted and rotated along the thoracic wall       AROM   Right/Left Shoulder  --   forward shoulder elevation 140 deg bilat pulling/tight    Cervical Flexion  52    Cervical Extension  26    Cervical - Right Side Bend  22    Cervical - Left Side Bend  27    Cervical - Right Rotation  54    Cervical - Left Rotation  35      Strength   Overall Strength Comments  WFL's bilat UE's weakness middle/lower trap bilat       Palpation   Spinal mobility  hypomobility thoracic and cervical spine with CPA mobs     Palpation comment  significant muscular tightness Lt > Rt ant/lat/post cervical musculature; upper trap/leveator; pecs; teres                 Objective measurements completed on examination: See above findings.      [facility] PT Treatment/Exercise - 05/05/18 0001      Shoulder Exercises: Standing   Other Standing Exercises  axial extension 10 sec x 5; soulder squeeze 10 sec x 10; L's x 10; W's x 10 with noodle       Shoulder Exercises: Stretch   Other Shoulder Stretches   3 way doorway stretch 30 sec x 2       Moist Heat Therapy   Number Minutes Moist Heat  20 Minutes    Moist Heat Location  Cervical   Lt shoulder girdle      Electrical Stimulation   Electrical Stimulation Location  Lt lateral cervical/upper trap    Electrical Stimulation Action  IFC    Electrical Stimulation Parameters  to tolerance    Electrical Stimulation Goals  Pain;Tone             PT Education - 05/05/18 1054    Education Details  HEP   (Pended)     Person(s) Educated  Patient  (Pended)     Methods  Explanation;Demonstration;Tactile cues;Verbal cues;Handout  (Pended)     Comprehension  Verbalized understanding;Returned demonstration;Verbal cues required;Tactile cues required  (Pended)           PT Long Term Goals - 05/05/18 1232      PT LONG TERM GOAL #1   Title  Improve posture and alignment with patient to demonstrate improved upright posture 06/16/18    Time  6    Period  Weeks    Status  New      PT LONG TERM GOAL #2   Title  Decrease pain by 50-75% allowing patient to turn head to Lt and upwards with greater ease for functional activities 06/16/18    Time  6    Period  Weeks    Status  New      PT LONG TERM GOAL #3   Title  Increase cervical ROM by 5-10 degrees in all planes 06/16/18    Time  6    Period  Weeks    Status  New      PT LONG TERM GOAL #4   Title  Independent in HEP 06/16/18    Time  6    Period  Weeks    Status  New  PT LONG TERM GOAL #5   Title  Improve FOTO to </= 32% limitation 06/16/18    Time  6    Period  Weeks    Status  New             Plan - 05/05/18 1200    Clinical Impression Statement  Timmothy Sours presents with 5 month history of Lt cervical pain primarily with looking up and to the Lt. He has poor posture and alignment; limited UE and cervical mobility; muscular tightness to palpation and functional limitations. Patient will benefit from PT to address problems identified.     Clinical Presentation  Stable     Clinical Presentation due to:  forward posture occupationally and now in retirement with activities; recliner     Rehab Potential  Good    PT Frequency  2x / week    PT Duration  6 weeks    PT Treatment/Interventions  Patient/family education;ADLs/Self Care Home Management;Cryotherapy;Electrical Stimulation;Iontophoresis '4mg'$ /ml Dexamethasone;Moist Heat;Ultrasound;Dry needling;Manual techniques;Neuromuscular re-education;Therapeutic activities;Therapeutic exercise    PT Next Visit Plan  review HEP; continue postural correction; stretching; postural strengthening; deep tissue work v DN; modalities as indicated - add prolonged snow angel; myofacial ball release work    Oncologist with Plan of Care  Patient       Patient will benefit from skilled therapeutic intervention in order to improve the following deficits and impairments:  Postural dysfunction, Improper body mechanics, Pain, Increased fascial restricitons, Increased muscle spasms, Decreased mobility, Decreased range of motion, Decreased activity tolerance, Decreased strength  Visit Diagnosis: Cervicalgia - Plan: PT plan of care cert/re-cert  Other symptoms and signs involving the musculoskeletal system - Plan: PT plan of care cert/re-cert  Abnormal posture - Plan: PT plan of care cert/re-cert     Problem List Patient Active Problem List   Diagnosis Date Noted  . Accessory skin tags 04/28/2018  . Osteoarthritis of facet joint of cervical spine 04/26/2018  . DDD (degenerative disc disease), cervical 01/29/2018  . Irregular heart beat 01/27/2018  . LVH (left ventricular hypertrophy) 01/27/2018  . Neck pain 01/27/2018  . Serum calcium elevated 01/27/2018  . Elevated bilirubin 01/27/2018  . Loose stools 01/27/2018  . Low libido 10/08/2017  . Irritable bowel syndrome with diarrhea 10/08/2017  . Vision changes 10/08/2017  . Reactive airway disease that is not asthma 09/13/2017  . Bilateral lower extremity edema 09/13/2017   . Memory changes 08/30/2017  . Elevated fasting glucose 08/30/2017  . Cough 08/30/2017  . Acute non-recurrent pansinusitis 08/12/2017  . Hematoma 04/24/2017  . Excessive bleeding 04/24/2017  . Lipoma of neck 04/24/2017  . Need for influenza vaccination 03/25/2017  . Chronic pain of right knee 03/25/2017  . Epidermal cyst 03/25/2017  . Seborrheic keratoses 03/25/2017  . RLS (restless legs syndrome) 03/25/2017  . Diarrhea 03/25/2017  . Ear mass, left 06/06/2016  . Osteoarthritis of carpometacarpal joint of left thumb 01/05/2015  . OSA on CPAP 12/28/2013  . History of diverticulitis 10/20/2013  . Preventive measure 10/20/2013  . Hyperlipidemia 10/03/2013  . Essential hypertension, benign 09/30/2013  . Barrett's esophagus 09/30/2013  . Impaired fasting glucose 09/30/2013  . BPH (benign prostatic hyperplasia) 09/30/2013  . Bipolar 1 disorder, mixed ([place]) 09/30/2013  . Generalized anxiety disorder 09/30/2013    Everardo All PT,MPH 05/05/2018, 12:43 PM  [facility] [place], [place], [address] Phone: [phone]   Fax:  [phone]  Name: KELDAN EPLIN MRN: [id number] Date of Birth:  Jun 25, 1951   PHYSICAL THERAPY DISCHARGE SUMMARY  Visits from Start of Care: Evaluation Only  Current functional level related to goals / functional outcomes: See evaluation    Remaining deficits: Unchanged    Education / Equipment: HEP  Plan: Patient agrees to discharge.  Patient goals were not met. Patient is being discharged due to financial reasons.  ?????     Celyn P. Helene Kelp PT, MPH 06/04/18 11:17 AM

## 2018-05-05 NOTE — Patient Instructions (Signed)
Axial Extension (Chin Tuck)    Pull chin in and lengthen back of neck. Hold __5__ seconds while counting out loud. Repeat __10__ times. Do __several__ sessions per day.  Shoulder Blade Squeeze can use swim noodle     Rotate shoulders back, then squeeze shoulder blades down and back. Hold 10 sec Repeat _10___ times. Do __several __ sessions per day.  Upper Back Strength: Lower Trapezius / Rotator Cuff " L's "     Arms in waitress pose, palms up. Press hands back and slide shoulder blades down. Hold for __5__ seconds. Repeat _10___ times. 1-2 times per day.    Scapular Retraction: Elbow Flexion (Standing)  "W's"     With elbows bent to 90, pinch shoulder blades together and rotate arms out, keeping elbows bent. Repeat __10__ times per set. Do __1-2__ sets per session. Do _several ___ sessions per day.   Scapula Adduction With Pectoralis Stretch: Low - Standing   Shoulders at 45 hands even with shoulders, keeping weight through legs, shift weight forward until you feel pull or stretch through the front of your chest. Hold _30__ seconds. Do _3__ times, _2-4__ times per day.   Scapula Adduction With Pectoralis Stretch: Mid-Range - Standing   Shoulders at 90 elbows even with shoulders, keeping weight through legs, shift weight forward until you feel pull or strength through the front of your chest. Hold __30_ seconds. Do _3__ times, __2-4_ times per day.   Scapula Adduction With Pectoralis Stretch: High - Standing   Shoulders at 120 hands up high on the doorway, keeping weight on feet, shift weight forward until you feel pull or stretch through the front of your chest. Hold _30__ seconds. Do _3__ times, _2-3__ times per day.  TENS UNIT: This is helpful for muscle pain and spasm.   Search and Purchase a TENS 7000 2nd edition at www.tenspros.com. It should be less than $30.     TENS unit instructions: Do not shower or bathe with the unit on Turn the unit off  before removing electrodes or batteries If the electrodes lose stickiness add a drop of water to the electrodes after they are disconnected from the unit and place on plastic sheet. If you continued to have difficulty, call the TENS unit company to purchase more electrodes. Do not apply lotion on the skin area prior to use. Make sure the skin is clean and dry as this will help prolong the life of the electrodes. After use, always check skin for unusual red areas, rash or other skin difficulties. If there are any skin problems, does not apply electrodes to the same area. Never remove the electrodes from the unit by pulling the wires. Do not use the TENS unit or electrodes other than as directed. Do not change electrode placement without consultating your therapist or physician. Keep 2 fingers with between each electrode.     [facility] [place], [place] [address]  [phone] (office) [phone] (fax)

## 2018-05-12 ENCOUNTER — Encounter: Payer: Medicare HMO | Admitting: Rehabilitative and Restorative Service Providers"

## 2018-05-19 ENCOUNTER — Other Ambulatory Visit: Payer: Self-pay | Admitting: Physician Assistant

## 2018-05-19 DIAGNOSIS — R197 Diarrhea, unspecified: Secondary | ICD-10-CM

## 2018-05-27 ENCOUNTER — Other Ambulatory Visit: Payer: Self-pay

## 2018-05-27 DIAGNOSIS — R197 Diarrhea, unspecified: Secondary | ICD-10-CM

## 2018-05-28 ENCOUNTER — Other Ambulatory Visit: Payer: Self-pay

## 2018-05-28 DIAGNOSIS — R197 Diarrhea, unspecified: Secondary | ICD-10-CM

## 2018-05-28 MED ORDER — HYOSCYAMINE SULFATE ER 0.375 MG PO TB12: 0.3750 mg | ORAL_TABLET | Freq: Two times a day (BID) | ORAL | 1 refills | Status: DC

## 2018-06-02 ENCOUNTER — Other Ambulatory Visit: Payer: Self-pay | Admitting: Physician Assistant

## 2018-06-02 DIAGNOSIS — G2581 Restless legs syndrome: Secondary | ICD-10-CM

## 2018-07-03 DIAGNOSIS — G4733 Obstructive sleep apnea (adult) (pediatric): Secondary | ICD-10-CM | POA: Diagnosis not present

## 2018-07-15 ENCOUNTER — Encounter: Payer: Self-pay | Admitting: Family Medicine

## 2018-07-15 ENCOUNTER — Ambulatory Visit (INDEPENDENT_AMBULATORY_CARE_PROVIDER_SITE_OTHER): Payer: Medicare HMO | Admitting: Family Medicine

## 2018-07-15 VITALS — BP 167/65 | HR 51 | Ht 70.0 in | Wt 203.0 lb

## 2018-07-15 DIAGNOSIS — N529 Male erectile dysfunction, unspecified: Secondary | ICD-10-CM | POA: Insufficient documentation

## 2018-07-15 DIAGNOSIS — M542 Cervicalgia: Secondary | ICD-10-CM | POA: Diagnosis not present

## 2018-07-15 HISTORY — DX: Male erectile dysfunction, unspecified: N52.9

## 2018-07-15 MED ORDER — TADALAFIL 20 MG PO TABS: 10.0000 mg | ORAL_TABLET | ORAL | 11 refills | Status: DC | PRN

## 2018-07-15 NOTE — Progress Notes (Signed)
Alexander [name] is a 67 y.o. male who presents to [facility] today for neck pain. Alexander [name] has had ongoing neck pain, for which he has been seeing Luvenia Starch since June. She recommended that he go to PT. He went to one PT session in October and has not gone back. He was frustrated because she wanted to start working on straightening his shoulders. 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 He is here today, because his pain and ability to turn his head to the left is getting progressively worse. He would like an injection. He denies pain radiating down his arm.   Erectile dysfunction.  Patient is a widower.  His wife died about 2 years ago.  He has a new girlfriend and thinks the relationship may be becoming more sexual in nature.  He notes that he is not confident in his erections.  He notes that they are not as hard as they used to be and he is worried about being able to sustain an erection during sex.  He is interested in Viagra or Cialis.  He does not take nitrates.  ROS:  As above  Exam:  BP (!) 167/65   Pulse (!) 51   Ht 5\' 10"  (1.778 m)   Wt 203 lb (92.1 kg)   BMI 29.13 kg/m  General: Well Developed, well nourished, and in no acute distress.  Neuro/Psych: Alert and oriented x3, extra-ocular muscles intact, able to move all 4 extremities, sensation grossly intact. Skin: Warm and dry, no rashes noted.  Respiratory: Not using accessory muscles, speaking in full sentences, trachea midline.  Cardiovascular: Pulses palpable, no extremity edema. Abdomen: Does not appear distended. MSK:  Cervical spine: No erythema or rashes on inspection.  No tenderness to palpation over cervical spine.  Unable to extend the neck. Normal flexion.  Normal side-to-side flexion. Limited lateral rotation, left > right.  5/5 strength. Biceps and brachioradialis reflexes 2+  bilaterally.  Pulses and capillary refill normal.    Lab and Radiology Results  Cervical spine x-ray 01/27/18:  CLINICAL DATA:  Neck pain over the last month.  No known injury.  EXAM: CERVICAL SPINE - COMPLETE 4+ VIEW  COMPARISON:  None.  FINDINGS: Mild curvature convex to the right. No antero or retrolisthesis. Left-sided predominant facet osteoarthritis. Disc space narrowing at C4-5, C5-6 and C6-7 consistent with ordinary spondylosis.  IMPRESSION: Mild curvature convex to the right. Ordinary mid cervical spondylosis. Facet osteoarthritis worse on the left. Findings could certainly be associated with neck pain.   Electronically Signed   By: Nelson Chimes M.D.   On: 01/27/2018 16:01   Assessment and Plan: 67 y.o. male with  Cervicalgia: Alexander [name] has has ongoing neck pain and has been seeing [facility]. He tried PT one time, but did not return. Cervical x-ray in June revealed facet joint osteoarthritis and spondylosis. Advised pt to try PT again and to discuss his concerns with his therapist. As his pain and limited ROM is progressing, will plan for MRI at this time. Will follow-up with pt after MRI and plan for injection.  Patient has had symptoms ongoing now for greater than 6 weeks with trials of provider/physician directed conservative management.  This she was addressed as early as January 27, 2018.  Erectile dysfunction: Likely vascular.  Plan for trial of Cialis.  Discussed how to use the medication and safety.  Will reassess in the near future.  Orders Placed This Encounter  Procedures  . MR Cervical Spine Wo Contrast    Standing Status:   Future    Standing Expiration Date:   09/16/2019    Order Specific Question:   What is the patient's sedation requirement?    Answer:   No Sedation    Order Specific Question:   Does the patient have a pacemaker or implanted devices?    Answer:   No    Order Specific Question:   Preferred imaging location?    Answer:   [facility] (table limit-350lbs)    Order Specific Question:   Radiology Contrast Protocol - do NOT remove file path    Answer:   \\[email or web address]  . DG Eye Foreign Body    Standing Status:   Future    Standing Expiration Date:   09/16/2019    Order Specific Question:   Reason for Exam (SYMPTOM  OR DIAGNOSIS REQUIRED)    Answer:   eval chronic neck pain    Order Specific Question:   Preferred imaging location?    Answer:   [facility]    Order Specific Question:   Radiology Contrast Protocol - do NOT remove file path    Answer:   \\[email or web address]   Meds ordered this encounter  Medications  . tadalafil (ADCIRCA/CIALIS) 20 MG tablet    Sig: Take 0.5-1 tablets (10-20 mg total) by mouth every other day as needed for erectile dysfunction.    Dispense:  30 tablet    Refill:  11    Historical information moved to improve visibility of documentation.  Past Medical History:  Diagnosis Date  . Barrett's esophagus 09/30/2013  . Bipolar 1 disorder, mixed ([place]) 09/30/2013   [facility], Dr. Ardine Eng   . BPH (benign prostatic hyperplasia) 09/30/2013  . Essential hypertension, benign 09/30/2013   Past Surgical History:  Procedure Laterality Date  . COLOSTOMY REVERSAL    . large bowel perforation     Social History   Tobacco Use  . Smoking status: Former Smoker    Last attempt to quit: 09/30/1980    Years since quitting: 37.8  . Smokeless tobacco: Never Used  Substance Use Topics  . Alcohol use: Yes    Alcohol/week: 14.0 standard drinks    Types: 14 Standard drinks or equivalent per week   family history is not on file.  Medications: Current Outpatient Medications  Medication Sig Dispense Refill  . AMBULATORY NON FORMULARY MEDICATION Zostavax IM once for shingles prevention. 1 application 0  . AMBULATORY NON FORMULARY MEDICATION Increase Auto-PAP machine with heated humidifier settings to 9-20cm H2O.   Dx: Obstructive sleep apnea. 1 Units 0  . amLODipine (NORVASC) 10 MG tablet TAKE 1 TABLET (10 MG TOTAL) BY MOUTH DAILY. 90 tablet 1  . aspirin 81 MG tablet Take 81 mg by mouth daily.    [name] atorvastatin (LIPITOR) 20 MG tablet Take 1 tablet (20 mg total) by mouth daily. 90 tablet 1  . azelastine (ASTELIN) 0.1 % nasal spray Place 2 sprays into both nostrils 2 (two) times daily. Use in each nostril as directed 30 mL 0  . benazepril (LOTENSIN) 20 MG tablet Take 1 tablet (20 mg total) by mouth daily. 90 tablet 1  . clonazePAM (KLONOPIN) 0.5 MG tablet Take 1 tablet (0.5 mg total) by mouth 2 (two) times daily as needed for anxiety. 30 tablet 5  . cyclobenzaprine (FLEXERIL) 10 MG tablet Take 1 tablet (10 mg total) by  mouth 3 (three) times daily as needed for muscle spasms. 30 tablet 0  . doxazosin (CARDURA) 4 MG tablet Take 1 tablet (4 mg total) by mouth 2 (two) times daily. 180 tablet 1  . hyoscyamine (LEVBID) 0.375 MG 12 hr tablet Take 1 tablet (0.375 mg total) by mouth 2 (two) times daily. 60 tablet 1  . LITHIUM CARBONATE PO Take 450 mg by mouth. Take 2 1/2 tablets per day    . meloxicam (MOBIC) 15 MG tablet Take 1 tablet (15 mg total) by mouth daily. 90 tablet 1  . Misc Natural Products (TUMERSAID PO) Take by mouth.    . montelukast (SINGULAIR) 10 MG tablet Take 1 tablet (10 mg total) by mouth at bedtime. 90 tablet 3  . Omega-3 Fatty Acids (FISH OIL) 1000 MG CAPS One by mouth twice a day with meals.  0  . pantoprazole (PROTONIX) 40 MG tablet TAKE 1 TABLET (40 MG TOTAL) BY MOUTH 2 (TWO) TIMES DAILY. 180 tablet 3  . rOPINIRole (REQUIP) 0.25 MG tablet TAKE 1 TABLET (0.25 MG TOTAL) BY MOUTH AT BEDTIME. FOR RESTLESS LEGS. 90 tablet 0  . sertraline (ZOLOFT) 100 MG tablet Take 100 mg by mouth daily.    [name] triamcinolone cream (KENALOG) 0.1 % Apply 1 application topically 2 (two) times daily. 80 g 1   No current facility-administered medications for this visit.    Allergies  Allergen Reactions  . Effexor  [Venlafaxine]       Discussed warning signs or symptoms. Please see discharge instructions. Patient expresses understanding.  I personally was present and performed or re-performed the history, physical exam and medical decision-making activities of this service and have verified that the service and findings are accurately documented in the student's note. ___________________________________________ Lynne Leader M.D., ABFM., CAQSM. Primary Care and Sports Medicine Adjunct Instructor of [place] [employer]

## 2018-07-15 NOTE — Patient Instructions (Addendum)
Thank you for coming in today.  You should hear about the MRI soon. Recheck after MRI.   Try clialis as needed for erections.     Facet Joint Block The facet joints connect the bones of the spine (vertebrae). They make it possible for you to bend, twist, and make other movements with your spine. They also keep you from bending too far, twisting too far, and making other excessive movements. A facet joint block is a procedure where a numbing medicine (anesthetic) is injected into a facet joint. Often, a type of anti-inflammatory medicine called a steroid is also injected. A facet joint block may be done to diagnose neck or back pain. If the pain gets better after a facet joint block, it means the pain is probably coming from the facet joint. If the pain does not get better, it means the pain is probably not coming from the facet joint. A facet joint block may also be done to relieve neck or back pain caused by an inflamed facet joint. A facet joint block is only done to relieve pain if the pain does not improve with other methods, such as medicine, exercise programs, and physical therapy. Tell a health care provider about:  Any allergies you have.  All medicines you are taking, including vitamins, herbs, eye drops, creams, and over-the-counter medicines.  Any problems you or family members have had with anesthetic medicines.  Any blood disorders you have.  Any surgeries you have had.  Any medical conditions you have.  Whether you are pregnant or may be pregnant. What are the risks? Generally, this is a safe procedure. However, problems may occur, including:  Bleeding.  Injury to a nerve near the injection site.  Pain at the injection site.  Weakness or numbness in areas controlled by nerves near the injection site.  Infection.  Temporary fluid retention.  Allergic reactions to medicines or dyes.  Injury to other structures or organs near the injection site.  What happens  before the procedure?  Follow instructions from your health care provider about eating or drinking restrictions.  Ask your health care provider about: ? Changing or stopping your regular medicines. This is especially important if you are taking diabetes medicines or blood thinners. ? Taking medicines such as aspirin and ibuprofen. These medicines can thin your blood. Do not take these medicines before your procedure if your health care provider instructs you not to.  Do not take any new dietary supplements or medicines without asking your health care provider first.  Plan to have someone take you home after the procedure. What happens during the procedure?  You may need to remove your clothing and dress in an open-back gown.  The procedure will be done while you are lying on an X-ray table. You will most likely be asked to lie on your stomach, but you may be asked to lie in a different position if an injection will be made in your neck.  Machines will be used to monitor your oxygen levels, heart rate, and blood pressure.  If an injection will be made in your neck, an IV tube will be inserted into one of your veins. Fluids and medicine will flow directly into your body through the IV tube.  The area over the facet joint where the injection will be made will be cleaned with soap. The surrounding skin will be covered with clean drapes.  A numbing medicine (local anesthetic) will be applied to your skin. Your skin may sting  or burn for a moment.  A video X-ray machine (fluoroscopy) will be used to locate the joint. In some cases, a CT scan may be used.  A contrast dye may be injected into the facet joint area to help locate the joint.  When the joint is located, an anesthetic will be injected into the joint through the needle.  Your health care provider will ask you whether you feel pain relief. If you do feel relief, a steroid may be injected to provide pain relief for a longer period of  time. If you do not feel relief or feel only partial relief, additional injections of an anesthetic may be made in other facet joints.  The needle will be removed.  Your skin will be cleaned.  A bandage (dressing) will be applied over each injection site. The procedure may vary among health care providers and hospitals. What happens after the procedure?  You will be observed for 15-30 minutes before being allowed to go home. This information is not intended to replace advice given to you by your health care provider. Make sure you discuss any questions you have with your health care provider. Document Released: 12/10/2006 Document Revised: 08/22/2015 Document Reviewed: 04/16/2015 Elsevier Interactive Patient Education  [employer].

## 2018-07-26 ENCOUNTER — Ambulatory Visit (INDEPENDENT_AMBULATORY_CARE_PROVIDER_SITE_OTHER): Payer: Medicare HMO

## 2018-07-26 DIAGNOSIS — M4802 Spinal stenosis, cervical region: Secondary | ICD-10-CM | POA: Diagnosis not present

## 2018-07-26 DIAGNOSIS — M542 Cervicalgia: Secondary | ICD-10-CM

## 2018-07-29 ENCOUNTER — Other Ambulatory Visit: Payer: Self-pay | Admitting: Physician Assistant

## 2018-07-29 DIAGNOSIS — E782 Mixed hyperlipidemia: Secondary | ICD-10-CM

## 2018-08-03 ENCOUNTER — Other Ambulatory Visit: Payer: Self-pay | Admitting: Physician Assistant

## 2018-08-03 ENCOUNTER — Ambulatory Visit (INDEPENDENT_AMBULATORY_CARE_PROVIDER_SITE_OTHER): Payer: Medicare HMO | Admitting: Family Medicine

## 2018-08-03 VITALS — BP 133/60 | HR 56 | Ht 70.0 in | Wt 203.0 lb

## 2018-08-03 DIAGNOSIS — M542 Cervicalgia: Secondary | ICD-10-CM

## 2018-08-03 DIAGNOSIS — I10 Essential (primary) hypertension: Secondary | ICD-10-CM

## 2018-08-03 NOTE — Progress Notes (Signed)
Alexander [name] is a 67 y.o. male who presents to [facility] today for neck pain.  Alexander [name] has been experiencing neck pain for some time now.  He was originally seen by his PCP in June 2019.  He had physical therapy which after one session he gave up on an is not interested in repeating.  He has been doing some home exercise program as directed by his provider since June.  I saw him on December 12 and at that time proceeded with cervical MRI for potential facet injection planning as he did not have significant radicular pain.  He had MRI on December 23 and is here for follow-up.  Left C3-C4 facet joint look to be the most irritated and probably the best target for facet injection.   Patient notes continued left-sided neck pain.    ROS:  As above  Exam:  BP 133/60   Pulse (!) 56   Ht 5\' 10"  (1.778 m)   Wt 203 lb (92.1 kg)   BMI 29.13 kg/m  General: Well Developed, well nourished, and in no acute distress.  Neuro/Psych: Alert and oriented x3, extra-ocular muscles intact, able to move all 4 extremities, sensation grossly intact. Skin: Warm and dry, no rashes noted.  Respiratory: Not using accessory muscles, speaking in full sentences, trachea midline.  Cardiovascular: Pulses palpable, no extremity edema. Abdomen: Does not appear distended. MSK: C-spine: Nontender to spinal midline.  Mildly tender to palpation left lateral neck.  Decreased range of motion.    Lab and Radiology Results Mr Cervical Spine Wo Contrast  Result Date: 07/26/2018 CLINICAL DATA:  Left-sided neck pain and stiffness for the past 7 8 months. EXAM: MRI CERVICAL SPINE WITHOUT CONTRAST TECHNIQUE: Multiplanar, multisequence MR imaging of the cervical spine was performed. No intravenous contrast was administered. COMPARISON:  Cervical spine x-rays dated January 27, 2018. FINDINGS: Alignment: Trace anterolisthesis at C6-C7. Vertebrae: No fracture, evidence of discitis, or bone  lesion. Mild left-sided degenerative endplate marrow edema at C3-C4. Cord: Normal signal. Posterior Fossa, vertebral arteries, paraspinal tissues: Negative. Disc levels: C2-C3:  Mild left facet arthropathy.  No stenosis. C3-C4: Broad-based disc protrusion and left greater than right uncovertebral hypertrophy. Left facet joint effusion with perifacet marrow edema. Mild to moderate central spinal canal stenosis. Moderate left neuroforaminal stenosis. No spinal canal or right neuroforaminal stenosis. C4-C5: Small posterior disc osteophyte complex. Bilateral facet uncovertebral hypertrophy. Mild to moderate central spinal canal stenosis. Mild left neuroforaminal stenosis. No right neuroforaminal stenosis. C5-C6: Shallow disc bulge and mild bilateral facet uncovertebral hypertrophy. Effacement of the ventral thecal sac with borderline mild central spinal canal stenosis. Mild left greater than right neuroforaminal stenosis. C6-C7: Shallow disc bulge and mild bilateral facet uncovertebral hypertrophy. Mild left neuroforaminal stenosis. No spinal canal or right neuroforaminal stenosis. C7-T1: Small central and left paracentral disc protrusion. Mild bilateral facet uncovertebral hypertrophy. Mild left neuroforaminal stenosis. No spinal canal or right neuroforaminal stenosis. IMPRESSION: 1. Multilevel degenerative changes of the cervical spine as described above. Mild to moderate spinal canal stenosis at C3-C4 and C4-C5. Moderate left neuroforaminal stenosis at C3-C4. 2. Acute degenerative inflammation of the left C3-C4 facet joint, which can be a source of neck pain. Electronically Signed   By: Titus Dubin M.D.   On: 07/26/2018 13:17   I personally (independently) visualized and performed the interpretation of the images attached in this note.     Assessment and Plan: 67 y.o. male with neck pain.  Plan to proceed with facet  injection and recheck as needed.  Lengthy discussion regarding MRI findings differential  diagnosis treatment plan and options.  I spent 15 minutes with this patient, greater than 50% was face-to-face time counseling regarding ddx and plan.    Orders Placed This Encounter  Procedures  . DG FACET JT INJ C/T SINGLE LEVEL LEFT W/FL/CT    Order Specific Question:   Reason for exam:    Answer:   left C3-C4    Order Specific Question:   Preferred imaging location?    Answer:   [facility]   No orders of the defined types were placed in this encounter.   Historical information moved to improve visibility of documentation.  Past Medical History:  Diagnosis Date  . Barrett's esophagus 09/30/2013  . Bipolar 1 disorder, mixed ([place]) 09/30/2013   [facility], Dr. Ardine Eng   . BPH (benign prostatic hyperplasia) 09/30/2013  . Essential hypertension, benign 09/30/2013   Past Surgical History:  Procedure Laterality Date  . COLOSTOMY REVERSAL    . large bowel perforation     Social History   Tobacco Use  . Smoking status: Former Smoker    Last attempt to quit: 09/30/1980    Years since quitting: 37.8  . Smokeless tobacco: Never Used  Substance Use Topics  . Alcohol use: Yes    Alcohol/week: 14.0 standard drinks    Types: 14 Standard drinks or equivalent per week   family history is not on file.  Medications: Current Outpatient Medications  Medication Sig Dispense Refill  . AMBULATORY NON FORMULARY MEDICATION Zostavax IM once for shingles prevention. 1 application 0  . AMBULATORY NON FORMULARY MEDICATION Increase Auto-PAP machine with heated humidifier settings to 9-20cm H2O.  Dx: Obstructive sleep apnea. 1 Units 0  . aspirin 81 MG tablet Take 81 mg by mouth daily.    [name] atorvastatin (LIPITOR) 20 MG tablet TAKE 1 TABLET BY MOUTH EVERY DAY 90 tablet 1  . azelastine (ASTELIN) 0.1 % nasal spray Place 2 sprays into both nostrils 2 (two) times daily. Use in each nostril as directed 30 mL 0  . benazepril (LOTENSIN) 20 MG tablet Take 1 tablet (20 mg total) by mouth daily. 90  tablet 1  . clonazePAM (KLONOPIN) 0.5 MG tablet Take 1 tablet (0.5 mg total) by mouth 2 (two) times daily as needed for anxiety. 30 tablet 5  . cyclobenzaprine (FLEXERIL) 10 MG tablet Take 1 tablet (10 mg total) by mouth 3 (three) times daily as needed for muscle spasms. 30 tablet 0  . doxazosin (CARDURA) 4 MG tablet Take 1 tablet (4 mg total) by mouth 2 (two) times daily. 180 tablet 1  . hyoscyamine (LEVBID) 0.375 MG 12 hr tablet Take 1 tablet (0.375 mg total) by mouth 2 (two) times daily. 60 tablet 1  . LITHIUM CARBONATE PO Take 450 mg by mouth. Take 2 1/2 tablets per day    . meloxicam (MOBIC) 15 MG tablet Take 1 tablet (15 mg total) by mouth daily. 90 tablet 1  . Misc Natural Products (TUMERSAID PO) Take by mouth.    . montelukast (SINGULAIR) 10 MG tablet Take 1 tablet (10 mg total) by mouth at bedtime. 90 tablet 3  . Omega-3 Fatty Acids (FISH OIL) 1000 MG CAPS One by mouth twice a day with meals.  0  . pantoprazole (PROTONIX) 40 MG tablet TAKE 1 TABLET (40 MG TOTAL) BY MOUTH 2 (TWO) TIMES DAILY. 180 tablet 3  . rOPINIRole (REQUIP) 0.25 MG tablet TAKE 1 TABLET (0.25 MG  TOTAL) BY MOUTH AT BEDTIME. FOR RESTLESS LEGS. 90 tablet 0  . sertraline (ZOLOFT) 100 MG tablet Take 100 mg by mouth daily.    . tadalafil (ADCIRCA/CIALIS) 20 MG tablet Take 0.5-1 tablets (10-20 mg total) by mouth every other day as needed for erectile dysfunction. 30 tablet 11  . triamcinolone cream (KENALOG) 0.1 % Apply 1 application topically 2 (two) times daily. 80 g 1  . amLODipine (NORVASC) 10 MG tablet TAKE 1 TABLET BY MOUTH EVERY DAY Patient is due for appointment for refills. 30 tablet 0   No current facility-administered medications for this visit.    Allergies  Allergen Reactions  . Effexor [Venlafaxine]       Discussed warning signs or symptoms. Please see discharge instructions. Patient expresses understanding.

## 2018-08-03 NOTE — Patient Instructions (Signed)
Thank you for coming in today.  You should hear about the neck injections.  Let me know if you do not hear anything.  Let me know how you feel after the shots.    Facet Joint Block The facet joints connect the bones of the spine (vertebrae). They make it possible for you to bend, twist, and make other movements with your spine. They also keep you from bending too far, twisting too far, and making other excessive movements. A facet joint block is a procedure where a numbing medicine (anesthetic) is injected into a facet joint. Often, a type of anti-inflammatory medicine called a steroid is also injected. A facet joint block may be done to diagnose neck or back pain. If the pain gets better after a facet joint block, it means the pain is probably coming from the facet joint. If the pain does not get better, it means the pain is probably not coming from the facet joint. A facet joint block may also be done to relieve neck or back pain caused by an inflamed facet joint. A facet joint block is only done to relieve pain if the pain does not improve with other methods, such as medicine, exercise programs, and physical therapy. Tell a health care provider about:  Any allergies you have.  All medicines you are taking, including vitamins, herbs, eye drops, creams, and over-the-counter medicines.  Any problems you or family members have had with anesthetic medicines.  Any blood disorders you have.  Any surgeries you have had.  Any medical conditions you have.  Whether you are pregnant or may be pregnant. What are the risks? Generally, this is a safe procedure. However, problems may occur, including:  Bleeding.  Injury to a nerve near the injection site.  Pain at the injection site.  Weakness or numbness in areas controlled by nerves near the injection site.  Infection.  Temporary fluid retention.  Allergic reactions to medicines or dyes.  Injury to other structures or organs near the  injection site. What happens before the procedure?  Follow instructions from your health care provider about eating or drinking restrictions.  Ask your health care provider about: ? Changing or stopping your regular medicines. This is especially important if you are taking diabetes medicines or blood thinners. ? Taking medicines such as aspirin and ibuprofen. These medicines can thin your blood. Do not take these medicines before your procedure if your health care provider instructs you not to.  Do not take any new dietary supplements or medicines without asking your health care provider first.  Plan to have someone take you home after the procedure. What happens during the procedure?   You may need to remove your clothing and dress in an open-back gown.  The procedure will be done while you are lying on an X-ray table. You will most likely be asked to lie on your stomach, but you may be asked to lie in a different position if an injection will be made in your neck.  Machines will be used to monitor your oxygen levels, heart rate, and blood pressure.  If an injection will be made in your neck, an IV tube will be inserted into one of your veins. Fluids and medicine will flow directly into your body through the IV tube.  The area over the facet joint where the injection will be made will be cleaned with soap. The surrounding skin will be covered with clean drapes.  A numbing medicine (local anesthetic) will be  applied to your skin. Your skin may sting or burn for a moment.  A video X-ray machine (fluoroscopy) will be used to locate the joint. In some cases, a CT scan may be used.  A contrast dye may be injected into the facet joint area to help locate the joint.  When the joint is located, an anesthetic will be injected into the joint through the needle.  Your health care provider will ask you whether you feel pain relief. If you do feel relief, a steroid may be injected to provide  pain relief for a longer period of time. If you do not feel relief or feel only partial relief, additional injections of an anesthetic may be made in other facet joints.  The needle will be removed.  Your skin will be cleaned.  A bandage (dressing) will be applied over each injection site. The procedure may vary among health care providers and hospitals. What happens after the procedure?  You will be observed for 15-30 minutes before being allowed to go home. This information is not intended to replace advice given to you by your health care provider. Make sure you discuss any questions you have with your health care provider. Document Released: 12/10/2006 Document Revised: 07/23/2017 Document Reviewed: 04/16/2015 Elsevier Interactive Patient Education  2019 [employer].

## 2018-08-12 ENCOUNTER — Other Ambulatory Visit: Payer: Medicare HMO

## 2018-08-17 ENCOUNTER — Telehealth: Payer: Self-pay

## 2018-08-17 NOTE — Telephone Encounter (Signed)
Called and spoke with Pt's daughter to get some more information. Order was sent to [facility] and she was advised he do not take his insurance. requesting order be sent to [facility]. Order for epidural injection printed and faxed.

## 2018-08-17 NOTE — Telephone Encounter (Signed)
Patient daughter Tomasa Hosteller called and wanted a follow up on her father referral to [facility] to get injection done. patient has not heard anything. Please contact patient daughter Lambert Keto @ [phone] with an update so that patient can get scheduled. Rhonda Cunningham,CMA

## 2018-08-18 NOTE — Telephone Encounter (Signed)
I sent referral to [facility] the day I spoke with patients daughter - CF

## 2018-08-28 ENCOUNTER — Other Ambulatory Visit: Payer: Self-pay | Admitting: Physician Assistant

## 2018-08-28 DIAGNOSIS — I10 Essential (primary) hypertension: Secondary | ICD-10-CM

## 2018-09-03 ENCOUNTER — Encounter: Payer: Self-pay | Admitting: Physician Assistant

## 2018-09-03 ENCOUNTER — Ambulatory Visit (INDEPENDENT_AMBULATORY_CARE_PROVIDER_SITE_OTHER): Payer: Medicare Other | Admitting: Physician Assistant

## 2018-09-03 VITALS — BP 146/82 | HR 59 | Ht 70.0 in | Wt 207.0 lb

## 2018-09-03 DIAGNOSIS — G2581 Restless legs syndrome: Secondary | ICD-10-CM

## 2018-09-03 DIAGNOSIS — G8929 Other chronic pain: Secondary | ICD-10-CM

## 2018-09-03 DIAGNOSIS — K2270 Barrett's esophagus without dysplasia: Secondary | ICD-10-CM

## 2018-09-03 DIAGNOSIS — E782 Mixed hyperlipidemia: Secondary | ICD-10-CM

## 2018-09-03 DIAGNOSIS — I10 Essential (primary) hypertension: Secondary | ICD-10-CM

## 2018-09-03 DIAGNOSIS — M25561 Pain in right knee: Secondary | ICD-10-CM | POA: Diagnosis not present

## 2018-09-03 DIAGNOSIS — M47812 Spondylosis without myelopathy or radiculopathy, cervical region: Secondary | ICD-10-CM

## 2018-09-03 MED ORDER — AMLODIPINE BESYLATE 10 MG PO TABS: ORAL_TABLET | ORAL | 3 refills | Status: DC

## 2018-09-03 MED ORDER — ROPINIROLE HCL 0.25 MG PO TABS: 0.2500 mg | ORAL_TABLET | Freq: Every day | ORAL | 3 refills | Status: DC

## 2018-09-03 MED ORDER — DOXAZOSIN MESYLATE 4 MG PO TABS: 4.0000 mg | ORAL_TABLET | Freq: Two times a day (BID) | ORAL | 1 refills | Status: DC

## 2018-09-03 MED ORDER — BENAZEPRIL HCL 20 MG PO TABS: 20.0000 mg | ORAL_TABLET | Freq: Every day | ORAL | 3 refills | Status: DC

## 2018-09-03 MED ORDER — PANTOPRAZOLE SODIUM 40 MG PO TBEC: DELAYED_RELEASE_TABLET | ORAL | 3 refills | Status: DC

## 2018-09-03 MED ORDER — ATORVASTATIN CALCIUM 20 MG PO TABS: 20.0000 mg | ORAL_TABLET | Freq: Every day | ORAL | 3 refills | Status: DC

## 2018-09-03 NOTE — Progress Notes (Signed)
Subjective:    Patient ID: Alexander [name], male    DOB: 05-25-1951, 68 y.o.   MRN: [id number]  HPI  Pt is a 68 yo male with HTN, HLD, Barretts esophagus, RLS who comes in for medication refill.   He recently has been having a lot of neck pain determined it was cervical spondylosis with evidence of forminal narrowing. Gabapentin has made a little better. He is also going to try some injections. Dr. Francesco Runner is managing.   Pt is taking his medication daily. No other problems or concerns.   Denies and CP, palpitations, headaches or vision changes.   .. Active Ambulatory Problems    Diagnosis Date Noted  . Essential hypertension, benign 09/30/2013  . Barrett's esophagus 09/30/2013  . Impaired fasting glucose 09/30/2013  . BPH (benign prostatic hyperplasia) 09/30/2013  . Bipolar 1 disorder, mixed ([place]) 09/30/2013  . Generalized anxiety disorder 09/30/2013  . Hyperlipidemia 10/03/2013  . History of diverticulitis 10/20/2013  . Preventive measure 10/20/2013  . OSA on CPAP 12/28/2013  . Osteoarthritis of carpometacarpal joint of left thumb 01/05/2015  . Ear mass, left 06/06/2016  . Need for influenza vaccination 03/25/2017  . Chronic pain of right knee 03/25/2017  . Epidermal cyst 03/25/2017  . Seborrheic keratoses 03/25/2017  . RLS (restless legs syndrome) 03/25/2017  . Diarrhea 03/25/2017  . Hematoma 04/24/2017  . Excessive bleeding 04/24/2017  . Lipoma of neck 04/24/2017  . Acute non-recurrent pansinusitis 08/12/2017  . Memory changes 08/30/2017  . Elevated fasting glucose 08/30/2017  . Cough 08/30/2017  . Reactive airway disease that is not asthma 09/13/2017  . Bilateral lower extremity edema 09/13/2017  . Low libido 10/08/2017  . Irritable bowel syndrome with diarrhea 10/08/2017  . Vision changes 10/08/2017  . Irregular heart beat 01/27/2018  . LVH (left ventricular hypertrophy) 01/27/2018  . Neck pain 01/27/2018  . Serum calcium elevated 01/27/2018  . Elevated  bilirubin 01/27/2018  . Loose stools 01/27/2018  . DDD (degenerative disc disease), cervical 01/29/2018  . Osteoarthritis of facet joint of cervical spine 04/26/2018  . Accessory skin tags 04/28/2018  . Erectile dysfunction 07/15/2018   Resolved Ambulatory Problems    Diagnosis Date Noted  . No Resolved Ambulatory Problems   No Additional Past Medical History     Review of Systems  All other systems reviewed and are negative.      Objective:   Physical Exam Vitals signs reviewed.  Constitutional:      Appearance: Normal appearance.  HENT:     Head: Normocephalic and atraumatic.  Cardiovascular:     Rate and Rhythm: Normal rate and regular rhythm.  Pulmonary:     Effort: Pulmonary effort is normal.     Breath sounds: Normal breath sounds.  Neurological:     General: No focal deficit present.     Mental Status: He is alert and oriented to person, place, and time.  Psychiatric:        Mood and Affect: Mood normal.           Assessment & Plan:  [name] KitchenMarland KitchenElenore Rota L was seen today for medication refill and neck pain.  Diagnoses and all orders for this visit:  Essential hypertension, benign -     amLODipine (NORVASC) 10 MG tablet; Take one tablet daily. -     benazepril (LOTENSIN) 20 MG tablet; Take 1 tablet (20 mg total) by mouth daily. -     doxazosin (CARDURA) 4 MG tablet; Take 1 tablet (4 mg total) by mouth 2 (  two) times daily. -     COMPLETE METABOLIC PANEL WITH GFR  Mixed hyperlipidemia -     atorvastatin (LIPITOR) 20 MG tablet; Take 1 tablet (20 mg total) by mouth daily. -     Lipid Panel w/reflex Direct LDL  Chronic pain of right knee  Barrett's esophagus without dysplasia -     pantoprazole (PROTONIX) 40 MG tablet; TAKE 1 TABLET (40 MG TOTAL) BY MOUTH 2 (TWO) TIMES DAILY.  RLS (restless legs syndrome) -     rOPINIRole (REQUIP) 0.25 MG tablet; Take 1 tablet (0.25 mg total) by mouth at bedtime. For restless legs.  Cervical spondylosis  .[name] Depression screen  [facility] 2/9 09/03/2018 04/26/2018 10/07/2017 03/25/2017  Decreased Interest 0 0 0 0  Down, Depressed, Hopeless 0 1 0 0  PHQ - 2 Score 0 1 0 0  Altered sleeping - 0 0 -  Tired, decreased energy - 1 0 -  Change in appetite - 0 0 -  Feeling bad or failure about yourself  - 0 0 -  Trouble concentrating - 0 0 -  Moving slowly or fidgety/restless - 0 0 -  Suicidal thoughts - 0 0 -  PHQ-9 Score - 2 0 -  Difficult doing work/chores - Not difficult at all Not difficult at all -    BP under 150/90.  Labs ordered.  Follow up in 6 months.   Continue to follow neurosurgeon for neck pain.

## 2018-09-08 LAB — LIPID PANEL W/REFLEX DIRECT LDL
Cholesterol: 159 mg/dL (ref <200)
HDL: 59 mg/dL (ref >=40)
LDL Cholesterol (Calc): 70 mg/dL (calc)
Non-HDL Cholesterol (Calc): 100 mg/dL (calc) (ref <130)
Total CHOL/HDL Ratio: 2.7 (calc) (ref <5.0)
Triglycerides: 201 mg/dL — ABNORMAL HIGH (ref <150)

## 2018-09-08 LAB — COMPLETE METABOLIC PANEL WITH GFR
AG Ratio: 1.9 (calc) (ref 1.0–2.5)
ALBUMIN MSPROF: 4.6 g/dL (ref 3.6–5.1)
ALT: 34 U/L (ref 9–46)
AST: 22 U/L (ref 10–35)
Alkaline phosphatase (APISO): 88 U/L (ref 35–144)
BUN: 18 mg/dL (ref 7–25)
CO2: 24 mmol/L (ref 20–32)
Calcium: 10.8 mg/dL — ABNORMAL HIGH (ref 8.6–10.3)
Chloride: 105 mmol/L (ref 98–110)
Creat: 0.96 mg/dL (ref 0.70–1.25)
GFR, Est African American: 94 mL/min/{1.73_m2} (ref >=60)
GFR, Est Non African American: 81 mL/min/{1.73_m2} (ref >=60)
GLOBULIN: 2.4 g/dL (ref 1.9–3.7)
Glucose, Bld: 100 mg/dL — ABNORMAL HIGH (ref 65–99)
Potassium: 4.6 mmol/L (ref 3.5–5.3)
SODIUM: 138 mmol/L (ref 135–146)
Total Bilirubin: 0.9 mg/dL (ref 0.2–1.2)
Total Protein: 7 g/dL (ref 6.1–8.1)

## 2018-09-08 NOTE — Progress Notes (Signed)
Call pt: LDL and HDL look good. TG are elevated at 200. Are you taking fish oil?  Kidney and liver look good.  Calcium is still a tad elevated, are you taking the vitamin D regularly?

## 2018-09-10 NOTE — Progress Notes (Signed)
Was he not tolerating fish oil? Would he consider tricor to lower TG to under 150?

## 2018-09-15 ENCOUNTER — Encounter: Payer: Self-pay | Admitting: Physician Assistant

## 2018-09-15 ENCOUNTER — Ambulatory Visit (INDEPENDENT_AMBULATORY_CARE_PROVIDER_SITE_OTHER): Payer: Medicare Other | Admitting: Physician Assistant

## 2018-09-15 VITALS — BP 125/59 | HR 71 | Ht 70.0 in | Wt 203.0 lb

## 2018-09-15 DIAGNOSIS — M47812 Spondylosis without myelopathy or radiculopathy, cervical region: Secondary | ICD-10-CM | POA: Insufficient documentation

## 2018-09-15 DIAGNOSIS — M5030 Other cervical disc degeneration, unspecified cervical region: Secondary | ICD-10-CM

## 2018-09-15 HISTORY — DX: Spondylosis without myelopathy or radiculopathy, cervical region: M47.812

## 2018-09-15 MED ORDER — HYDROCODONE-ACETAMINOPHEN 5-325 MG PO TABS: 1.0000 | ORAL_TABLET | Freq: Four times a day (QID) | ORAL | 0 refills | Status: DC | PRN

## 2018-09-15 MED ORDER — METHOCARBAMOL 500 MG PO TABS: 500.0000 mg | ORAL_TABLET | Freq: Three times a day (TID) | ORAL | 0 refills | Status: DC

## 2018-09-15 MED ORDER — HYDROCODONE-ACETAMINOPHEN 5-325 MG PO TABS: 1.0000 | ORAL_TABLET | Freq: Four times a day (QID) | ORAL | 0 refills | Status: AC | PRN

## 2018-09-15 NOTE — Progress Notes (Signed)
Subjective:    Patient ID: Alexander [name], male    DOB: [birth year], 68 y.o.   MRN: [id number]  HPI  Pt is a 68 yo male with cervical spondylosis with radiculopathy.  The patient sees Dr. Francesco Runner neurosurgeon for this management.  He does have a cervical epidural injection scheduled in 9 days.  He is currently on gabapentin and meloxicam.  He was given Flexeril in the emergency room when he went for pain.  This is not done anything to help with symptoms.  He has tried prednisone burst before with no improvement.  The only thing that helps is laying down with his shoulders elevated and head in an extension manner.  His pain is constant and describes as numbness and tingling radiating mostly down left arm.  He comes in for help with relief until he can see Dr. Francesco Runner.  Patient has tried physical therapy with little to no benefit.  He uses a TENS unit often.  Uses ice and heat to help control symptoms.  None of this is helping.  .. Active Ambulatory Problems    Diagnosis Date Noted  . Essential hypertension, benign 09/30/2013  . Barrett's esophagus 09/30/2013  . Impaired fasting glucose 09/30/2013  . BPH (benign prostatic hyperplasia) 09/30/2013  . Bipolar 1 disorder, mixed ([place]) 09/30/2013  . Generalized anxiety disorder 09/30/2013  . Hyperlipidemia 10/03/2013  . History of diverticulitis 10/20/2013  . Preventive measure 10/20/2013  . OSA on CPAP 12/28/2013  . Osteoarthritis of carpometacarpal joint of left thumb 01/05/2015  . Ear mass, left 06/06/2016  . Need for influenza vaccination 03/25/2017  . Chronic pain of right knee 03/25/2017  . Epidermal cyst 03/25/2017  . Seborrheic keratoses 03/25/2017  . RLS (restless legs syndrome) 03/25/2017  . Diarrhea 03/25/2017  . Hematoma 04/24/2017  . Excessive bleeding 04/24/2017  . Lipoma of neck 04/24/2017  . Acute non-recurrent pansinusitis 08/12/2017  . Memory changes 08/30/2017  . Elevated fasting glucose 08/30/2017  . Cough  08/30/2017  . Reactive airway disease that is not asthma 09/13/2017  . Bilateral lower extremity edema 09/13/2017  . Low libido 10/08/2017  . Irritable bowel syndrome with diarrhea 10/08/2017  . Vision changes 10/08/2017  . Irregular heart beat 01/27/2018  . LVH (left ventricular hypertrophy) 01/27/2018  . Neck pain 01/27/2018  . Serum calcium elevated 01/27/2018  . Elevated bilirubin 01/27/2018  . Loose stools 01/27/2018  . DDD (degenerative disc disease), cervical 01/29/2018  . Osteoarthritis of facet joint of cervical spine 04/26/2018  . Accessory skin tags 04/28/2018  . Erectile dysfunction 07/15/2018  . Cervical spondylosis 09/15/2018   Resolved Ambulatory Problems    Diagnosis Date Noted  . No Resolved Ambulatory Problems   No Additional Past Medical History    Review of Systems See HPI.     Objective:   Physical Exam Vitals signs reviewed.  Musculoskeletal:     Comments: Decreased ROM of neck due to pain.  Very tight paraspinal muscles of the neck.   Neurological:     General: No focal deficit present.     Mental Status: He is alert and oriented to person, place, and time.           Assessment & Plan:  [name] KitchenMarland KitchenElenore Rota L was seen today for neck pain.  Diagnoses and all orders for this visit:  Cervical spondylosis -     methocarbamol (ROBAXIN) 500 MG tablet; Take 1 tablet (500 mg total) by mouth 3 (three) times daily. -     HYDROcodone-acetaminophen (NORCO/VICODIN)  5-325 MG tablet; Take 1 tablet by mouth every 6 (six) hours as needed for up to 5 days for moderate pain.  Osteoarthritis of facet joint of cervical spine -     methocarbamol (ROBAXIN) 500 MG tablet; Take 1 tablet (500 mg total) by mouth 3 (three) times daily. -     HYDROcodone-acetaminophen (NORCO/VICODIN) 5-325 MG tablet; Take 1 tablet by mouth every 6 (six) hours as needed for up to 5 days for moderate pain.  DDD (degenerative disc disease), cervical -     methocarbamol (ROBAXIN) 500 MG tablet;  Take 1 tablet (500 mg total) by mouth 3 (three) times daily. -     HYDROcodone-acetaminophen (NORCO/VICODIN) 5-325 MG tablet; Take 1 tablet by mouth every 6 (six) hours as needed for up to 5 days for moderate pain.  Other orders -     Discontinue: HYDROcodone-acetaminophen (NORCO/VICODIN) 5-325 MG tablet; Take 1 tablet by mouth every 6 (six) hours as needed for up to 5 days for moderate pain.   Patient has tried symptomatic conservative care for his symptoms.  He is not getting any relief.  I would like to try to keep him out of the emergency room for pain relief.  Stop Flexeril and use Robaxin as a muscle relaxer.  I increased gabapentin to 600 mg 3 times a day.  Explained he could titrate this over the next 7 days.  Continue meloxicam.  He may want to call imaging center to make sure he does not need to stop this before his injections.  I did give him a small quantity of Norco.  I explained not to take with his benzodiazepine as it does increase risk of sudden death.  I reviewed the controlled website with no concerns.  Follow-up as needed.  Patient does have appointment with Dr. Francesco Runner in 9 days.  [name] KitchenMarland KitchenPDMP reviewed during this encounter.

## 2018-09-15 NOTE — Patient Instructions (Addendum)
Increase gabapentin to 600mg  three times a day.  Take meloxicam.  Start robaxin.    Cervical Radiculopathy  Cervical radiculopathy means that a nerve in the neck is pinched or bruised. This can cause pain or loss of feeling (numbness) that runs from your neck to your arm and fingers. Follow these instructions at home: Managing pain  Take over-the-counter and prescription medicines only as told by your doctor.  If directed, put ice on the injured or painful area. ? Put ice in a plastic bag. ? Place a towel between your skin and the bag. ? Leave the ice on for 20 minutes, 2-3 times per day.  If ice does not help, you can try using heat. Take a warm shower or warm bath, or use a heat pack as told by your doctor.  You may try a gentle neck and shoulder massage. Activity  Rest as needed. Follow instructions from your doctor about any activities to avoid.  Do exercises as told by your doctor or physical therapist. General instructions  If you were given a soft collar, wear it as told by your doctor.  Use a flat pillow when you sleep.  Keep all follow-up visits as told by your doctor. This is important. Contact a doctor if:  Your condition does not improve with treatment. Get help right away if:  Your pain gets worse and is not controlled with medicine.  You lose feeling or feel weak in your hand, arm, face, or leg.  You have a fever.  You have a stiff neck.  You cannot control when you poop or pee (have incontinence).  You have trouble with walking, balance, or talking. This information is not intended to replace advice given to you by your health care provider. Make sure you discuss any questions you have with your health care provider. Document Released: 07/10/2011 Document Revised: 12/27/2015 Document Reviewed: 09/14/2014 Elsevier Interactive Patient Education  2019 [employer].

## 2018-09-24 ENCOUNTER — Ambulatory Visit (INDEPENDENT_AMBULATORY_CARE_PROVIDER_SITE_OTHER): Payer: Medicare Other | Admitting: Family Medicine

## 2018-09-24 ENCOUNTER — Encounter: Payer: Self-pay | Admitting: Family Medicine

## 2018-09-24 VITALS — BP 135/64 | HR 72 | Temp 97.9°F | Ht 70.0 in | Wt 203.0 lb

## 2018-09-24 DIAGNOSIS — R112 Nausea with vomiting, unspecified: Secondary | ICD-10-CM | POA: Diagnosis not present

## 2018-09-24 DIAGNOSIS — R251 Tremor, unspecified: Secondary | ICD-10-CM | POA: Diagnosis not present

## 2018-09-24 DIAGNOSIS — Z5181 Encounter for therapeutic drug level monitoring: Secondary | ICD-10-CM

## 2018-09-24 MED ORDER — PROMETHAZINE HCL 25 MG PO TABS: 25.0000 mg | ORAL_TABLET | Freq: Four times a day (QID) | ORAL | 2 refills | Status: DC | PRN

## 2018-09-24 NOTE — Progress Notes (Signed)
Alexander [name] is a 68 y.o. male who presents to [facility]: [place] today for vomiting.  Patient developed vomiting 3 days ago.  The vomiting is improving having only one episode yesterday.  He denies diarrhea.  He notes a little bit of abdominal discomfort but denies significant abdominal pain.  He denies chest pain palpitations or shortness of breath.  He cannot recall any fevers or chills either.  He is not tried much treatment yet for the symptoms.  He cannot recall any specific new medications recently.  He has been seen by pain management specialist for his neck pain notes that he can have his neck injection until his vomiting has resolved.  He was prescribed a few medicines during his visit January or early February but cannot recall what they are.  He notes he stopped 3 of his medicines when he started vomiting but cannot recall what those are either.  He also notes a bit of increased jitteriness and shakiness over the last few days.  He has a prescription for lithium and does take it regularly.  He takes it at bedtime.  His lithium dose was recently reduced to 2 pills a day.  Took lithium last night as per normal.    ROS as above:  Exam:  BP 135/64   Pulse 72   Temp 97.9 F (36.6 C)   Ht 5\' 10"  (1.778 m)   Wt 203 lb (92.1 kg)   BMI 29.13 kg/m  Wt Readings from Last 5 Encounters:  09/24/18 203 lb (92.1 kg)  09/15/18 203 lb (92.1 kg)  09/03/18 207 lb (93.9 kg)  08/03/18 203 lb (92.1 kg)  07/15/18 203 lb (92.1 kg)    Gen: Well NAD nontoxic appearing HEENT: EOMI,  MMM Lungs: Normal work of breathing. CTABL Heart: RRR no MRG Abd: NABS, Soft. Nondistended, minimally diffusely tender with no rebound or guarding. Exts: Brisk capillary refill, warm and well perfused.   Lab and Radiology Results No results found for this or any previous visit (from the past 72  hour(s)). No results found.    Assessment and Plan: 68 y.o. male with  Vomiting and abdominal discomfort.  Unclear etiology likely virally Castro enteritis.  However medication effect could certainly be a issue here as well.  Plan to check basic labs listed below including CBC metabolic panel lipase.  We will also check a lithium level for potential lithium toxicity.  Treat empirically with Phenergan.  Recheck if not improving.  PDMP not reviewed this encounter. Orders Placed This Encounter  Procedures  . CBC  . COMPLETE METABOLIC PANEL WITH GFR  . Lipase  . Lithium level   Meds ordered this encounter  Medications  . promethazine (PHENERGAN) 25 MG tablet    Sig: Take 1 tablet (25 mg total) by mouth every 6 (six) hours as needed for nausea or vomiting.    Dispense:  30 tablet    Refill:  2     Historical information moved to improve visibility of documentation.  Past Medical History:  Diagnosis Date  . Barrett's esophagus 09/30/2013  . Bipolar 1 disorder, mixed ([place]) 09/30/2013   [facility], Dr. Ardine Eng   . BPH (benign prostatic hyperplasia) 09/30/2013  . Essential hypertension, benign 09/30/2013   Past Surgical History:  Procedure Laterality Date  . COLOSTOMY REVERSAL    . large bowel perforation     Social History   Tobacco Use  . Smoking status: Former  Smoker    Last attempt to quit: 09/30/1980    Years since quitting: 38.0  . Smokeless tobacco: Never Used  Substance Use Topics  . Alcohol use: Yes    Alcohol/week: 14.0 standard drinks    Types: 14 Standard drinks or equivalent per week   family history is not on file.  Medications: Current Outpatient Medications  Medication Sig Dispense Refill  . AMBULATORY NON FORMULARY MEDICATION Zostavax IM once for shingles prevention. 1 application 0  . AMBULATORY NON FORMULARY MEDICATION Increase Auto-PAP machine with heated humidifier settings to 9-20cm H2O.  Dx: Obstructive sleep apnea. 1 Units 0  . amLODipine  (NORVASC) 10 MG tablet Take one tablet daily. 90 tablet 3  . aspirin 81 MG tablet Take 81 mg by mouth daily.    [name] atorvastatin (LIPITOR) 20 MG tablet Take 1 tablet (20 mg total) by mouth daily. 90 tablet 3  . azelastine (ASTELIN) 0.1 % nasal spray Place 2 sprays into both nostrils 2 (two) times daily. Use in each nostril as directed 30 mL 0  . benazepril (LOTENSIN) 20 MG tablet Take 1 tablet (20 mg total) by mouth daily. 90 tablet 3  . clonazePAM (KLONOPIN) 0.5 MG tablet Take 1 tablet (0.5 mg total) by mouth 2 (two) times daily as needed for anxiety. 30 tablet 5  . cyclobenzaprine (FLEXERIL) 10 MG tablet Take 1 tablet (10 mg total) by mouth 3 (three) times daily as needed for muscle spasms. 30 tablet 0  . doxazosin (CARDURA) 4 MG tablet Take 1 tablet (4 mg total) by mouth 2 (two) times daily. 180 tablet 1  . gabapentin (NEURONTIN) 300 MG capsule Take by mouth.    . hyoscyamine (LEVBID) 0.375 MG 12 hr tablet Take 1 tablet (0.375 mg total) by mouth 2 (two) times daily. 60 tablet 1  . LITHIUM CARBONATE PO Take 450 mg by mouth. Take 2 1/2 tablets per day    . meloxicam (MOBIC) 15 MG tablet Take 1 tablet (15 mg total) by mouth daily. 90 tablet 1  . methocarbamol (ROBAXIN) 500 MG tablet Take 1 tablet (500 mg total) by mouth 3 (three) times daily. 90 tablet 0  . Misc Natural Products (TUMERSAID PO) Take by mouth.    . Omega-3 Fatty Acids (FISH OIL) 1000 MG CAPS One by mouth twice a day with meals.  0  . pantoprazole (PROTONIX) 40 MG tablet TAKE 1 TABLET (40 MG TOTAL) BY MOUTH 2 (TWO) TIMES DAILY. 180 tablet 3  . rOPINIRole (REQUIP) 0.25 MG tablet Take 1 tablet (0.25 mg total) by mouth at bedtime. For restless legs. 90 tablet 3  . sertraline (ZOLOFT) 100 MG tablet Take 100 mg by mouth daily.    . promethazine (PHENERGAN) 25 MG tablet Take 1 tablet (25 mg total) by mouth every 6 (six) hours as needed for nausea or vomiting. 30 tablet 2   No current facility-administered medications for this visit.     Allergies  Allergen Reactions  . Effexor [Venlafaxine]      Discussed warning signs or symptoms. Please see discharge instructions. Patient expresses understanding.

## 2018-09-24 NOTE — Patient Instructions (Signed)
Thank you for coming in today. Get labs now.  Take phenergen for nausea or vomiting as needed.  Recheck next week if not improving or if worse.  Bring you pills to the next visit so we can over them.    Vomiting, Adult Vomiting occurs when stomach contents are thrown up and out of the mouth. Many people notice nausea before vomiting. Vomiting can make you feel weak and cause you to become dehydrated. Dehydration can make you feel tired and thirsty, cause you to have a dry mouth, and decrease how often you urinate. Older adults and people who have other diseases or a weak body defense system (immune system) are at higher risk for dehydration. It is important to treat vomiting as told by your health care provider. Follow these instructions at home:  Eating and drinking     Follow these recommendations as told by your health care provider:  Take an oral rehydration solution (ORS). This is a drink that is sold at pharmacies and retail stores.  Eat bland, easy-to-digest foods in small amounts as you are able. These foods include bananas, applesauce, rice, lean meats, toast, and crackers.  Drink clear fluids slowly and in small amounts as you are able. Clear fluids include water, ice chips, low-calorie sports drinks, and fruit juice that has water added (diluted fruit juice).  Avoid drinking fluids that contain a lot of sugar or caffeine, such as energy drinks, sports drinks, and soda.  Avoid alcohol.  Avoid spicy or fatty foods.  General instructions  Wash your hands often using soap and water. If soap and water are not available, use hand sanitizer. Make sure that everyone in your household washes their hands frequently.  Take over-the-counter and prescription medicines only as told by your health care provider.  Rest at home while you recover.  Watch your condition for any changes.  Keep all follow-up visits as told by your health care provider. This is important. Contact a  health care provider if:  Your vomiting gets worse.  You have new symptoms.  You have a fever.  You cannot drink fluids without vomiting.  You feel light-headed or dizzy.  You have a headache.  You have muscle cramps.  You have a rash.  You have pain while urinating. Get help right away if:  You have pain in your chest, neck, arm, or jaw.  You feel extremely weak or you faint.  You have persistent vomiting.  You have vomit that is bright red or looks like black coffee grounds.  You have stools that are bloody or black, or stools that look like tar.  You have a severe headache, a stiff neck, or both.  You have severe pain, cramping, or bloating in your abdomen.  You have trouble breathing or you are breathing very quickly.  Your heart is beating very quickly.  Your skin feels cold and clammy.  You feel confused.  You have signs of dehydration, such as: ? Dark urine, very little urine, or no urine. ? Cracked lips. ? Dry mouth. ? Sunken eyes. ? Sleepiness. ? Weakness. These symptoms may represent a serious problem that is an emergency. Do not wait to see if the symptoms will go away. Get medical help right away. Call your local emergency services (911 in the [place] Do not drive yourself to the hospital. Summary  Vomiting occurs when stomach contents are thrown up and out of the mouth. Vomiting can cause you to become dehydrated. Older adults and people who  have other diseases or a weak immune system are at higher risk for dehydration.  It is important to treat vomiting as told by your health care provider. Follow your health care provider's instructions about eating and drinking.  Wash your hands often using soap and water. If soap and water are not available, use hand sanitizer. Make sure that everyone in your household washes their hands frequently.  Watch your condition for any changes and for signs of dehydration.  Keep all follow-up visits as told by  your health care provider. This is important. This information is not intended to replace advice given to you by your health care provider. Make sure you discuss any questions you have with your health care provider. Document Released: 08/17/2015 Document Revised: 12/29/2017 Document Reviewed: 12/29/2017 Elsevier Interactive Patient Education  2019 [employer].

## 2018-09-25 LAB — CBC
HCT: 42.3 % (ref 38.5–50.0)
Hemoglobin: 14.5 g/dL (ref 13.2–17.1)
MCH: 32.2 pg (ref 27.0–33.0)
MCHC: 34.3 g/dL (ref 32.0–36.0)
MCV: 93.8 fL (ref 80.0–100.0)
MPV: 11 fL (ref 7.5–12.5)
Platelets: 237 10*3/uL (ref 140–400)
RBC: 4.51 10*6/uL (ref 4.20–5.80)
RDW: 13 % (ref 11.0–15.0)
WBC: 9.5 10*3/uL (ref 3.8–10.8)

## 2018-09-25 LAB — COMPLETE METABOLIC PANEL WITH GFR
AG Ratio: 1.9 (calc) (ref 1.0–2.5)
ALT: 33 U/L (ref 9–46)
AST: 22 U/L (ref 10–35)
Albumin: 4.5 g/dL (ref 3.6–5.1)
Alkaline phosphatase (APISO): 85 U/L (ref 35–144)
BILIRUBIN TOTAL: 1.3 mg/dL — AB (ref 0.2–1.2)
BUN: 21 mg/dL (ref 7–25)
CHLORIDE: 106 mmol/L (ref 98–110)
CO2: 25 mmol/L (ref 20–32)
Calcium: 11.1 mg/dL — ABNORMAL HIGH (ref 8.6–10.3)
Creat: 0.85 mg/dL (ref 0.70–1.25)
GFR, Est African American: 104 mL/min/{1.73_m2} (ref >=60)
GFR, Est Non African American: 90 mL/min/{1.73_m2} (ref >=60)
Globulin: 2.4 g/dL (calc) (ref 1.9–3.7)
Glucose, Bld: 126 mg/dL — ABNORMAL HIGH (ref 65–99)
Potassium: 3.9 mmol/L (ref 3.5–5.3)
Sodium: 139 mmol/L (ref 135–146)
Total Protein: 6.9 g/dL (ref 6.1–8.1)

## 2018-09-25 LAB — LITHIUM LEVEL: Lithium Lvl: 0.7 mmol/L (ref 0.6–1.2)

## 2018-09-25 LAB — LIPASE: Lipase: 31 U/L (ref 7–60)

## 2018-10-04 DIAGNOSIS — M5030 Other cervical disc degeneration, unspecified cervical region: Secondary | ICD-10-CM | POA: Diagnosis not present

## 2018-10-04 DIAGNOSIS — M47812 Spondylosis without myelopathy or radiculopathy, cervical region: Secondary | ICD-10-CM | POA: Diagnosis not present

## 2018-10-04 DIAGNOSIS — M9981 Other biomechanical lesions of cervical region: Secondary | ICD-10-CM | POA: Diagnosis not present

## 2018-10-04 DIAGNOSIS — G894 Chronic pain syndrome: Secondary | ICD-10-CM | POA: Diagnosis not present

## 2018-10-13 DIAGNOSIS — S161XXA Strain of muscle, fascia and tendon at neck level, initial encounter: Secondary | ICD-10-CM | POA: Diagnosis not present

## 2018-10-13 DIAGNOSIS — Z6829 Body mass index (BMI) 29.0-29.9, adult: Secondary | ICD-10-CM | POA: Diagnosis not present

## 2018-10-13 DIAGNOSIS — M546 Pain in thoracic spine: Secondary | ICD-10-CM | POA: Diagnosis not present

## 2018-10-13 DIAGNOSIS — S29019A Strain of muscle and tendon of unspecified wall of thorax, initial encounter: Secondary | ICD-10-CM | POA: Diagnosis not present

## 2018-10-13 DIAGNOSIS — M542 Cervicalgia: Secondary | ICD-10-CM | POA: Diagnosis not present

## 2018-10-13 DIAGNOSIS — M47812 Spondylosis without myelopathy or radiculopathy, cervical region: Secondary | ICD-10-CM | POA: Diagnosis not present

## 2018-10-13 DIAGNOSIS — Y999 Unspecified external cause status: Secondary | ICD-10-CM | POA: Diagnosis not present

## 2018-10-13 DIAGNOSIS — R079 Chest pain, unspecified: Secondary | ICD-10-CM | POA: Diagnosis not present

## 2018-10-13 DIAGNOSIS — E669 Obesity, unspecified: Secondary | ICD-10-CM | POA: Diagnosis not present

## 2018-10-13 DIAGNOSIS — M47814 Spondylosis without myelopathy or radiculopathy, thoracic region: Secondary | ICD-10-CM | POA: Diagnosis not present

## 2018-10-13 DIAGNOSIS — S299XXA Unspecified injury of thorax, initial encounter: Secondary | ICD-10-CM | POA: Diagnosis not present

## 2018-10-13 DIAGNOSIS — S199XXA Unspecified injury of neck, initial encounter: Secondary | ICD-10-CM | POA: Diagnosis not present

## 2018-10-13 DIAGNOSIS — S20212A Contusion of left front wall of thorax, initial encounter: Secondary | ICD-10-CM | POA: Diagnosis not present

## 2018-10-13 DIAGNOSIS — Y92410 Unspecified street and highway as the place of occurrence of the external cause: Secondary | ICD-10-CM | POA: Diagnosis not present

## 2018-10-13 DIAGNOSIS — R001 Bradycardia, unspecified: Secondary | ICD-10-CM | POA: Diagnosis not present

## 2018-10-15 ENCOUNTER — Ambulatory Visit (INDEPENDENT_AMBULATORY_CARE_PROVIDER_SITE_OTHER): Payer: Medicare HMO | Admitting: Physician Assistant

## 2018-10-15 ENCOUNTER — Other Ambulatory Visit: Payer: Self-pay

## 2018-10-15 ENCOUNTER — Encounter: Payer: Self-pay | Admitting: Physician Assistant

## 2018-10-15 VITALS — BP 142/58 | HR 70 | Ht 70.0 in | Wt 203.0 lb

## 2018-10-15 DIAGNOSIS — M5030 Other cervical disc degeneration, unspecified cervical region: Secondary | ICD-10-CM | POA: Diagnosis not present

## 2018-10-15 DIAGNOSIS — M47812 Spondylosis without myelopathy or radiculopathy, cervical region: Secondary | ICD-10-CM

## 2018-10-15 MED ORDER — HYDROCODONE-ACETAMINOPHEN 7.5-325 MG PO TABS: 1.0000 | ORAL_TABLET | ORAL | 0 refills | Status: DC | PRN

## 2018-10-15 MED ORDER — PREDNISONE 50 MG PO TABS: ORAL_TABLET | ORAL | 0 refills | Status: DC

## 2018-10-15 MED ORDER — HYDROCODONE-ACETAMINOPHEN 7.5-325 MG PO TABS: 1.0000 | ORAL_TABLET | ORAL | 0 refills | Status: AC | PRN

## 2018-10-15 MED ORDER — CARISOPRODOL 350 MG PO TABS: 350.0000 mg | ORAL_TABLET | Freq: Three times a day (TID) | ORAL | 0 refills | Status: DC | PRN
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
## 2018-10-18 DIAGNOSIS — M47812 Spondylosis without myelopathy or radiculopathy, cervical region: Secondary | ICD-10-CM | POA: Diagnosis not present

## 2018-10-18 DIAGNOSIS — G894 Chronic pain syndrome: Secondary | ICD-10-CM | POA: Diagnosis not present

## 2018-10-18 DIAGNOSIS — M7918 Myalgia, other site: Secondary | ICD-10-CM | POA: Diagnosis not present

## 2018-10-18 DIAGNOSIS — M5412 Radiculopathy, cervical region: Secondary | ICD-10-CM | POA: Diagnosis not present

## 2018-10-18 DIAGNOSIS — M9981 Other biomechanical lesions of cervical region: Secondary | ICD-10-CM | POA: Diagnosis not present

## 2018-10-18 NOTE — Progress Notes (Signed)
Faxed

## 2018-11-01 ENCOUNTER — Encounter: Payer: Self-pay | Admitting: Physician Assistant

## 2018-11-01 ENCOUNTER — Ambulatory Visit (INDEPENDENT_AMBULATORY_CARE_PROVIDER_SITE_OTHER): Payer: Medicare HMO | Admitting: Physician Assistant

## 2018-11-01 ENCOUNTER — Other Ambulatory Visit: Payer: Self-pay

## 2018-11-01 VITALS — BP 129/55 | HR 62 | Temp 98.0°F | Ht 70.0 in | Wt 199.0 lb

## 2018-11-01 DIAGNOSIS — F3160 Bipolar disorder, current episode mixed, unspecified: Secondary | ICD-10-CM

## 2018-11-01 DIAGNOSIS — G894 Chronic pain syndrome: Secondary | ICD-10-CM

## 2018-11-01 DIAGNOSIS — M5412 Radiculopathy, cervical region: Secondary | ICD-10-CM | POA: Diagnosis not present

## 2018-11-01 DIAGNOSIS — M5030 Other cervical disc degeneration, unspecified cervical region: Secondary | ICD-10-CM

## 2018-11-01 DIAGNOSIS — M47812 Spondylosis without myelopathy or radiculopathy, cervical region: Secondary | ICD-10-CM | POA: Diagnosis not present

## 2018-11-01 DIAGNOSIS — R69 Illness, unspecified: Secondary | ICD-10-CM | POA: Diagnosis not present

## 2018-11-01 HISTORY — DX: Chronic pain syndrome: G89.4

## 2018-11-01 MED ORDER — GABAPENTIN 300 MG PO CAPS: ORAL_CAPSULE | ORAL | 3 refills | Status: DC

## 2018-11-01 NOTE — Patient Instructions (Addendum)
Cymbalta ask psychiatrist for potential pain control with zoloft.

## 2018-11-01 NOTE — Progress Notes (Signed)
Subjective:    Patient ID: Alexander [name], male    DOB: 1950/08/25, 68 y.o.   MRN: [id number]  HPI  Pt is a 68 yo male with chronic neck pain for the last 6 months with cervical spondylosis, stenosis, radiculopathy. He is in daily constant pain 10/10. Mostly in neck but at times radiates down left side. He was seen by neurosurgery for epidural and trigger point injections with no benefit. Last visit was 3/16. He is using heat/ice/tens unit/massages/muscle relaxers/norco/gabapentin. He reports nothing helps. He admits he has been out of gabapentin for a while. His mood is worsening. He is bipolar and having uncontrolled anger and panic. He is following up with psychiatrist this week.   He continues to go to ER but not getting any help.   .. Active Ambulatory Problems    Diagnosis Date Noted  . Essential hypertension, benign 09/30/2013  . Barrett's esophagus 09/30/2013  . Impaired fasting glucose 09/30/2013  . BPH (benign prostatic hyperplasia) 09/30/2013  . Bipolar 1 disorder, mixed ([place]) 09/30/2013  . Generalized anxiety disorder 09/30/2013  . Hyperlipidemia 10/03/2013  . History of diverticulitis 10/20/2013  . Preventive measure 10/20/2013  . OSA on CPAP 12/28/2013  . Osteoarthritis of carpometacarpal joint of left thumb 01/05/2015  . Ear mass, left 06/06/2016  . Need for influenza vaccination 03/25/2017  . Chronic pain of right knee 03/25/2017  . Epidermal cyst 03/25/2017  . Seborrheic keratoses 03/25/2017  . RLS (restless legs syndrome) 03/25/2017  . Diarrhea 03/25/2017  . Hematoma 04/24/2017  . Excessive bleeding 04/24/2017  . Lipoma of neck 04/24/2017  . Acute non-recurrent pansinusitis 08/12/2017  . Memory changes 08/30/2017  . Elevated fasting glucose 08/30/2017  . Cough 08/30/2017  . Reactive airway disease that is not asthma 09/13/2017  . Bilateral lower extremity edema 09/13/2017  . Low libido 10/08/2017  . Irritable bowel syndrome with diarrhea 10/08/2017  .  Vision changes 10/08/2017  . Irregular heart beat 01/27/2018  . LVH (left ventricular hypertrophy) 01/27/2018  . Neck pain 01/27/2018  . Serum calcium elevated 01/27/2018  . Elevated bilirubin 01/27/2018  . Loose stools 01/27/2018  . DDD (degenerative disc disease), cervical 01/29/2018  . Osteoarthritis of facet joint of cervical spine 04/26/2018  . Accessory skin tags 04/28/2018  . Erectile dysfunction 07/15/2018  . Cervical spondylosis 09/15/2018  . Chronic pain disorder 11/01/2018  . Cervical radiculopathy 11/01/2018   Resolved Ambulatory Problems    Diagnosis Date Noted  . No Resolved Ambulatory Problems   No Additional Past Medical History      Review of Systems See HPI.     Objective:   Physical Exam Vitals signs reviewed.  Constitutional:      Appearance: Normal appearance.  HENT:     Head: Normocephalic and atraumatic.  Cardiovascular:     Rate and Rhythm: Normal rate and regular rhythm.     Pulses: Normal pulses.  Pulmonary:     Effort: Pulmonary effort is normal.     Breath sounds: Normal breath sounds.  Musculoskeletal:     Comments: Decreased ROM of neck due to pain.  Bilateral neck paraspinal tenderness and tightness.  No pain today over cspine.  Strength upper extremity 5/5.  Hand grip 5/5.   Neurological:     General: No focal deficit present.     Mental Status: He is alert and oriented to person, place, and time.  Psychiatric:        Mood and Affect: Mood normal.  Behavior: Behavior normal.           Assessment & Plan:  [name] KitchenMarland KitchenElenore Rota L was seen today for neck pain and shoulder pain.  Diagnoses and all orders for this visit:  Cervical spondylosis -     Ambulatory referral to Orthopedic Surgery -     gabapentin (NEURONTIN) 300 MG capsule; One tab PO qHS for a week, then BID for a week, then TID. May double weekly to a max of 3,600mg /day  Bipolar 1 disorder, mixed ([place])  DDD (degenerative disc disease), cervical -     Ambulatory  referral to Orthopedic Surgery -     gabapentin (NEURONTIN) 300 MG capsule; One tab PO qHS for a week, then BID for a week, then TID. May double weekly to a max of 3,600mg /day  Osteoarthritis of facet joint of cervical spine -     Ambulatory referral to Orthopedic Surgery -     gabapentin (NEURONTIN) 300 MG capsule; One tab PO qHS for a week, then BID for a week, then TID. May double weekly to a max of 3,600mg /day  Cervical radiculopathy -     Ambulatory referral to Orthopedic Surgery -     gabapentin (NEURONTIN) 300 MG capsule; One tab PO qHS for a week, then BID for a week, then TID. May double weekly to a max of 3,600mg /day  Chronic pain disorder -     Ambulatory referral to Orthopedic Surgery -     gabapentin (NEURONTIN) 300 MG capsule; One tab PO qHS for a week, then BID for a week, then TID. May double weekly to a max of 3,600mg /day   Pt is not getting any relief. He is very frustrated. He is desperate for help. He is in 10/10 pain. He has tried muscle relaxer, pain medication, gabapentin, NSAIDs, trigger point injections and epidural injections. He uses ice/heat/tens unit. PT did not help. He is seeing neurosurgery and asking for a referral to orthopedics. I did restart gabapentin and increse to three times a day. Flexeril as needed. norco not helping. He does have a few left that he uses as needed.   His mood is worsening likely due to constant pain. He is calling psychiatrist today. I asked him to discuss adding cymbalta that could help with pain and mood.   [name].Spent 30 minutes with patient and greater than 50 percent of visit spent counseling patient regarding treatment plan.

## 2018-11-02 DIAGNOSIS — R69 Illness, unspecified: Secondary | ICD-10-CM | POA: Diagnosis not present

## 2018-11-04 ENCOUNTER — Telehealth (INDEPENDENT_AMBULATORY_CARE_PROVIDER_SITE_OTHER): Payer: Self-pay | Admitting: Radiology

## 2018-11-04 NOTE — Telephone Encounter (Signed)
I called patient to confirm appointment for 11/05/2018 at 1030.  Patient answered "No" to all COVID-19 screening questions.

## 2018-11-05 ENCOUNTER — Encounter (INDEPENDENT_AMBULATORY_CARE_PROVIDER_SITE_OTHER): Payer: Self-pay | Admitting: Orthopaedic Surgery

## 2018-11-05 ENCOUNTER — Other Ambulatory Visit: Payer: Self-pay

## 2018-11-05 ENCOUNTER — Ambulatory Visit (INDEPENDENT_AMBULATORY_CARE_PROVIDER_SITE_OTHER): Payer: Medicare HMO | Admitting: Orthopaedic Surgery

## 2018-11-05 VITALS — Ht 70.0 in | Wt 202.0 lb

## 2018-11-05 DIAGNOSIS — M4802 Spinal stenosis, cervical region: Secondary | ICD-10-CM | POA: Diagnosis not present

## 2018-11-05 DIAGNOSIS — M47812 Spondylosis without myelopathy or radiculopathy, cervical region: Secondary | ICD-10-CM

## 2018-11-05 NOTE — Addendum Note (Signed)
Addended by: Meyer Cory on: 11/05/2018 02:43 PM   Modules accepted: Orders

## 2018-11-05 NOTE — Progress Notes (Signed)
Office Visit Note   Patient: Alexander [name]           Date of Birth: [birth year]           MRN: [id number] Visit Date: 11/05/2018              Requested by: Donella Stade, PA-C [address] [place], [place] [address] PCP: Donella Stade, PA-C   Assessment & Plan: Visit Diagnoses:  1. Cervical spondylosis   2. Spinal stenosis of cervical region     Plan: We will set him up for some home cervical traction, order some physical therapy and [facility] where he lives.  Recheck him in 4 weeks.  Hopefully his symptoms will settle down and we reviewed cervical MRI scan as well as report which shows some moderate cervical stenosis at C3-4 and C4-5.  He is neurologically intact.  Recheck 4 weeks.  Follow-Up Instructions: Return in about 4 weeks (around 12/03/2018).   Orders:  No orders of the defined types were placed in this encounter.  No orders of the defined types were placed in this encounter.     Procedures: No procedures performed   Clinical Data: No additional findings.   Subjective: Chief Complaint  Patient presents with  . Neck - Pain  . Left Shoulder - Pain    HPI 68 year old male with chronic cervical neck pain.  He been seen by Dr. Francesco Runner and has had multiple cervical epidurals.  Patient has bipolar disorder states became frustrated when he was told emergency room first problems with his neck since no elective epidural injections were being performed.  Patient's had a CT scan3/06/2019 which showed normal alignment negative for acute fracture.  [place] website checked which showed 30 tablets of Norco 7.5/320 513/13/20 and 20 tablets of Norco 5/325 on 09/15/2018.  He gets clonazepam 0.5 mg 90 a month from his psychiatrist.  Negative following history.  No myelopathic symptoms.  Review of Systems's system positive bipolar disorder, hypertension, hyperlipidemia, diverticulitis, CPAP use for sleep apnea.  History of knee pain.   Objective: Vital  Signs: Ht 5\' 10"  (1.778 m)   Wt 202 lb (91.6 kg)   BMI 28.98 kg/m   Physical Exam Constitutional:      Appearance: He is well-developed.  HENT:     Head: Normocephalic and atraumatic.  Eyes:     Pupils: Pupils are equal, round, and reactive to light.  Neck:     Thyroid: No thyromegaly.     Trachea: No tracheal deviation.  Cardiovascular:     Rate and Rhythm: Normal rate.  Pulmonary:     Effort: Pulmonary effort is normal.     Breath sounds: No wheezing.  Abdominal:     General: Bowel sounds are normal.     Palpations: Abdomen is soft.  Skin:    General: Skin is warm and dry.     Capillary Refill: Capillary refill takes less than 2 seconds.  Neurological:     Mental Status: He is alert and oriented to person, place, and time.  Psychiatric:        Behavior: Behavior normal.        Thought Content: Thought content normal.        Judgment: Judgment normal.     Ortho Exam patient is able to get his arm up over his head reach behind him with his left arm past axillary line.  He has some pain with neck cervical range of motion to the  left.  Negative Spurling.  Mild brachial plexus tenderness.  He can flex chin to finger wrist and chest and has normal extension but complains of pain.  Normal heel toe gait.  Biceps triceps brachial radialis reflex are 2+ and symmetrical.  No atrophy of the upper extremities.  Good capillary refill.  No rash over exposed skin.  Negative myelopathic gait no clonus.  Specialty Comments:  No specialty comments available.  Imaging:  CLINICAL DATA:  Left-sided neck pain and stiffness for the past 7 8 months.  EXAM: MRI CERVICAL SPINE WITHOUT CONTRAST  TECHNIQUE: Multiplanar, multisequence MR imaging of the cervical spine was performed. No intravenous contrast was administered.  COMPARISON:  Cervical spine x-rays dated January 27, 2018.  FINDINGS: Alignment: Trace anterolisthesis at C6-C7.  Vertebrae: No fracture, evidence of discitis, or  bone lesion. Mild left-sided degenerative endplate marrow edema at C3-C4.  Cord: Normal signal.  Posterior Fossa, vertebral arteries, paraspinal tissues: Negative.  Disc levels:  C2-C3:  Mild left facet arthropathy.  No stenosis.  C3-C4: Broad-based disc protrusion and left greater than right uncovertebral hypertrophy. Left facet joint effusion with perifacet marrow edema. Mild to moderate central spinal canal stenosis. Moderate left neuroforaminal stenosis. No spinal canal or right neuroforaminal stenosis.  C4-C5: Small posterior disc osteophyte complex. Bilateral facet uncovertebral hypertrophy. Mild to moderate central spinal canal stenosis. Mild left neuroforaminal stenosis. No right neuroforaminal stenosis.  C5-C6: Shallow disc bulge and mild bilateral facet uncovertebral hypertrophy. Effacement of the ventral thecal sac with borderline mild central spinal canal stenosis. Mild left greater than right neuroforaminal stenosis.  C6-C7: Shallow disc bulge and mild bilateral facet uncovertebral hypertrophy. Mild left neuroforaminal stenosis. No spinal canal or right neuroforaminal stenosis.  C7-T1: Small central and left paracentral disc protrusion. Mild bilateral facet uncovertebral hypertrophy. Mild left neuroforaminal stenosis. No spinal canal or right neuroforaminal stenosis.  IMPRESSION: 1. Multilevel degenerative changes of the cervical spine as described above. Mild to moderate spinal canal stenosis at C3-C4 and C4-C5. Moderate left neuroforaminal stenosis at C3-C4. 2. Acute degenerative inflammation of the left C3-C4 facet joint, which can be a source of neck pain.   Electronically Signed   By: Titus Dubin M.D.   On: 07/26/2018 13:17   PMFS History: Patient Active Problem List   Diagnosis Date Noted  . Chronic pain disorder 11/01/2018  . Cervical radiculopathy 11/01/2018  . Cervical spondylosis 09/15/2018  . Erectile dysfunction  07/15/2018  . Accessory skin tags 04/28/2018  . Osteoarthritis of facet joint of cervical spine 04/26/2018  . DDD (degenerative disc disease), cervical 01/29/2018  . Irregular heart beat 01/27/2018  . LVH (left ventricular hypertrophy) 01/27/2018  . Neck pain 01/27/2018  . Serum calcium elevated 01/27/2018  . Elevated bilirubin 01/27/2018  . Loose stools 01/27/2018  . Low libido 10/08/2017  . Irritable bowel syndrome with diarrhea 10/08/2017  . Vision changes 10/08/2017  . Reactive airway disease that is not asthma 09/13/2017  . Bilateral lower extremity edema 09/13/2017  . Memory changes 08/30/2017  . Elevated fasting glucose 08/30/2017  . Cough 08/30/2017  . Acute non-recurrent pansinusitis 08/12/2017  . Hematoma 04/24/2017  . Excessive bleeding 04/24/2017  . Lipoma of neck 04/24/2017  . Need for influenza vaccination 03/25/2017  . Chronic pain of right knee 03/25/2017  . Epidermal cyst 03/25/2017  . Seborrheic keratoses 03/25/2017  . RLS (restless legs syndrome) 03/25/2017  . Diarrhea 03/25/2017  . Ear mass, left 06/06/2016  . Osteoarthritis of carpometacarpal joint of left thumb 01/05/2015  . OSA on  CPAP 12/28/2013  . History of diverticulitis 10/20/2013  . Preventive measure 10/20/2013  . Hyperlipidemia 10/03/2013  . Essential hypertension, benign 09/30/2013  . Barrett's esophagus 09/30/2013  . Impaired fasting glucose 09/30/2013  . BPH (benign prostatic hyperplasia) 09/30/2013  . Bipolar 1 disorder, mixed ([place]) 09/30/2013  . Generalized anxiety disorder 09/30/2013   Past Medical History:  Diagnosis Date  . Barrett's esophagus 09/30/2013  . Bipolar 1 disorder, mixed ([place]) 09/30/2013   [facility], Dr. Ardine Eng   . BPH (benign prostatic hyperplasia) 09/30/2013  . Essential hypertension, benign 09/30/2013    No family history on file.  Past Surgical History:  Procedure Laterality Date  . COLOSTOMY REVERSAL    . large bowel perforation     Social History    Occupational History  . Not on file  Tobacco Use  . Smoking status: Former Smoker    Last attempt to quit: 09/30/1980    Years since quitting: 38.1  . Smokeless tobacco: Never Used  Substance and Sexual Activity  . Alcohol use: Yes    Alcohol/week: 14.0 standard drinks    Types: 14 Standard drinks or equivalent per week  . Drug use: No  . Sexual activity: Not Currently    Partners: Female

## 2018-11-08 ENCOUNTER — Telehealth: Payer: Self-pay

## 2018-11-08 DIAGNOSIS — M5030 Other cervical disc degeneration, unspecified cervical region: Secondary | ICD-10-CM

## 2018-11-08 DIAGNOSIS — M5412 Radiculopathy, cervical region: Secondary | ICD-10-CM

## 2018-11-08 DIAGNOSIS — M47812 Spondylosis without myelopathy or radiculopathy, cervical region: Secondary | ICD-10-CM

## 2018-11-08 NOTE — Telephone Encounter (Signed)
Patient left a note with the front office asking for a referral to physical therapy but doesn't want to go to the physical therapist in our office building. Please advise on location to refer him.

## 2018-11-08 NOTE — Telephone Encounter (Signed)
Referral placed.

## 2018-11-08 NOTE — Telephone Encounter (Signed)
Ortho gave most recent referral to here in [place] but ok to go to [facility] in [place] for cervical neck pain. There may be delay in scheduling due to [facility].

## 2018-11-08 NOTE — Telephone Encounter (Signed)
Referral Sent

## 2018-11-10 DIAGNOSIS — M6281 Muscle weakness (generalized): Secondary | ICD-10-CM | POA: Diagnosis not present

## 2018-11-10 DIAGNOSIS — M542 Cervicalgia: Secondary | ICD-10-CM | POA: Diagnosis not present

## 2018-11-16 DIAGNOSIS — M6281 Muscle weakness (generalized): Secondary | ICD-10-CM | POA: Diagnosis not present

## 2018-11-16 DIAGNOSIS — M542 Cervicalgia: Secondary | ICD-10-CM | POA: Diagnosis not present

## 2018-11-16 DIAGNOSIS — R69 Illness, unspecified: Secondary | ICD-10-CM | POA: Diagnosis not present

## 2018-11-18 DIAGNOSIS — G4733 Obstructive sleep apnea (adult) (pediatric): Secondary | ICD-10-CM | POA: Diagnosis not present

## 2018-11-19 DIAGNOSIS — M6281 Muscle weakness (generalized): Secondary | ICD-10-CM | POA: Diagnosis not present

## 2018-11-19 DIAGNOSIS — M542 Cervicalgia: Secondary | ICD-10-CM | POA: Diagnosis not present

## 2018-11-22 ENCOUNTER — Ambulatory Visit (INDEPENDENT_AMBULATORY_CARE_PROVIDER_SITE_OTHER): Payer: Medicare HMO | Admitting: Physician Assistant

## 2018-11-22 ENCOUNTER — Encounter: Payer: Self-pay | Admitting: Physician Assistant

## 2018-11-22 VITALS — BP 138/89 | HR 75 | Temp 98.4°F | Ht 70.0 in | Wt 199.0 lb

## 2018-11-22 DIAGNOSIS — M5030 Other cervical disc degeneration, unspecified cervical region: Secondary | ICD-10-CM

## 2018-11-22 DIAGNOSIS — M47812 Spondylosis without myelopathy or radiculopathy, cervical region: Secondary | ICD-10-CM

## 2018-11-22 DIAGNOSIS — R69 Illness, unspecified: Secondary | ICD-10-CM | POA: Diagnosis not present

## 2018-11-22 DIAGNOSIS — G894 Chronic pain syndrome: Secondary | ICD-10-CM

## 2018-11-22 DIAGNOSIS — M5412 Radiculopathy, cervical region: Secondary | ICD-10-CM | POA: Diagnosis not present

## 2018-11-22 DIAGNOSIS — F5101 Primary insomnia: Secondary | ICD-10-CM

## 2018-11-22 MED ORDER — GABAPENTIN 600 MG PO TABS: ORAL_TABLET | ORAL | 1 refills | Status: DC

## 2018-11-22 MED ORDER — HYDROCODONE-ACETAMINOPHEN 5-325 MG PO TABS: ORAL_TABLET | ORAL | 0 refills | Status: DC

## 2018-11-22 NOTE — Patient Instructions (Signed)
Take mobic daily.  Take tylenol 1000mg  up to 3 times a day.  Take norco up to once a day or as needed for severe pain.  Increase gabapentin to 600mg  three times a day.  Get sling avoid pulling of arm.

## 2018-11-22 NOTE — Progress Notes (Signed)
Subjective:    Patient ID: Alexander [name], male    DOB: 06-17-1951, 68 y.o.   MRN: [id number]  HPI  Pt is a 68 yo male with chronic neck pain for the last 6 months due to cervical spondylosis, stenosis, radiculopathy. He continues to be in daily pain. We made another referral to [facility] and he finally feels like there is a plan. [facility] is [facility] in [facility]. He does not get any relief through this but following the plan so that he can follow up and will consider surgery for him. He is seeing Dr. Louanne Skye. He comes in wanting norco refill. He is taking sparingly. Last refill was 10/15/18. He only takes when he has too.   He is bipolar and his pain is effecting mood but overall he feels like he is doing ok. He is working with psychiatrist to manage mood through pain.   Not checking BP at home. Denies any CP, palpitations or SOB.   [name]. Active Ambulatory Problems    Diagnosis Date Noted  . Essential hypertension, benign 09/30/2013  . Barrett's esophagus 09/30/2013  . Impaired fasting glucose 09/30/2013  . BPH (benign prostatic hyperplasia) 09/30/2013  . Bipolar 1 disorder, mixed ([place]) 09/30/2013  . Generalized anxiety disorder 09/30/2013  . Hyperlipidemia 10/03/2013  . History of diverticulitis 10/20/2013  . Preventive measure 10/20/2013  . OSA on CPAP 12/28/2013  . Osteoarthritis of carpometacarpal joint of left thumb 01/05/2015  . Ear mass, left 06/06/2016  . Need for influenza vaccination 03/25/2017  . Chronic pain of right knee 03/25/2017  . Epidermal cyst 03/25/2017  . Seborrheic keratoses 03/25/2017  . RLS (restless legs syndrome) 03/25/2017  . Diarrhea 03/25/2017  . Hematoma 04/24/2017  . Excessive bleeding 04/24/2017  . Lipoma of neck 04/24/2017  . Acute non-recurrent pansinusitis 08/12/2017  . Memory changes 08/30/2017  . Elevated fasting glucose 08/30/2017  . Cough 08/30/2017  . Reactive airway disease that is not asthma 09/13/2017  . Bilateral lower  extremity edema 09/13/2017  . Low libido 10/08/2017  . Irritable bowel syndrome with diarrhea 10/08/2017  . Vision changes 10/08/2017  . Irregular heart beat 01/27/2018  . LVH (left ventricular hypertrophy) 01/27/2018  . Neck pain 01/27/2018  . Serum calcium elevated 01/27/2018  . Elevated bilirubin 01/27/2018  . Loose stools 01/27/2018  . DDD (degenerative disc disease), cervical 01/29/2018  . Osteoarthritis of facet joint of cervical spine 04/26/2018  . Accessory skin tags 04/28/2018  . Erectile dysfunction 07/15/2018  . Cervical spondylosis 09/15/2018  . Chronic pain disorder 11/01/2018  . Cervical radiculopathy 11/01/2018  . Spinal stenosis of cervical region 11/05/2018   Resolved Ambulatory Problems    Diagnosis Date Noted  . No Resolved Ambulatory Problems   No Additional Past Medical History      Review of Systems    see HPI>  Objective:   Physical Exam Vitals signs reviewed.  Constitutional:      Appearance: Normal appearance.  HENT:     Head: Normocephalic.  Cardiovascular:     Rate and Rhythm: Normal rate.     Pulses: Normal pulses.  Pulmonary:     Effort: Pulmonary effort is normal.  Musculoskeletal:     Comments: Decreased ROM of neck and left shoulder due to pain. No pain to palpation over cervical spine. Holds left arm in a flexed position to his chest to help with pain.   Neurological:     General: No focal deficit present.     Mental Status:  He is alert and oriented to person, place, and time.  Psychiatric:     Comments: Very frustrated.            Assessment & Plan:  [name] KitchenMarland KitchenElenore Rota [name] was seen today for pain.  Diagnoses and all orders for this visit:  Cervical spondylosis -     HYDROcodone-acetaminophen (NORCO/VICODIN) 5-325 MG tablet; Take one tablet daily as needed for moderate to severe pain. -     gabapentin (NEURONTIN) 600 MG tablet; Two tabs PO TID -     Pain Mgmt, Profile 6 Conf w/o mM, U  Cervical radiculopathy -      HYDROcodone-acetaminophen (NORCO/VICODIN) 5-325 MG tablet; Take one tablet daily as needed for moderate to severe pain. -     gabapentin (NEURONTIN) 600 MG tablet; Two tabs PO TID -     Pain Mgmt, Profile 6 Conf w/o mM, U  Chronic pain disorder -     HYDROcodone-acetaminophen (NORCO/VICODIN) 5-325 MG tablet; Take one tablet daily as needed for moderate to severe pain. -     gabapentin (NEURONTIN) 600 MG tablet; Two tabs PO TID -     Pain Mgmt, Profile 6 Conf w/o mM, U  DDD (degenerative disc disease), cervical -     HYDROcodone-acetaminophen (NORCO/VICODIN) 5-325 MG tablet; Take one tablet daily as needed for moderate to severe pain. -     gabapentin (NEURONTIN) 600 MG tablet; Two tabs PO TID -     Pain Mgmt, Profile 6 Conf w/o mM, U  Primary insomnia   .[name] Opioid Risk Tool - 11/22/18 1052      Family History of Substance Abuse   Alcohol  Positive Male    Illegal Drugs  Negative    Rx Drugs  Negative      Personal History of Substance Abuse   Alcohol  Positive Male or Male    Illegal Drugs  Negative    Rx Drugs  Negative      Age   Age between 6-45 years   No      History of Preadolescent Sexual Abuse   History of Preadolescent Sexual Abuse  Negative or Male      Psychological Disease   Psychological Disease  Positive    ADD  Negative    OCD  Negative    Bipolar  Positive    Schizophrenia  Negative    Depression  Negative      Total Score   Opioid Risk Tool Scoring  8    Opioid Risk Interpretation  High Risk      Patient is high risk of abuse and dependency of opioids. I explained this to him. I want to give the very least that we can and only take as needed. Went over other pain and treatment therapies.  Take mobic daily.  Take tylenol 1000mg  TID.  Increased gabapentin to 600mg  TID.  Pt gets relief from arm in sling position ok to get to use some but needs to be doing PT exercises.  Continue PT.  Continue tens unit.  Continue heat and ice.  NOrco up to once  a day as needed for severe pain.   Pain contract signed.  UDS ordered today.   I will refill 30 tablets a month for 3 months then reassess. Hopefully by then will have some answers about surgery with ortho.   [name].Spent 30 minutes with patient and greater than 50 percent of visit spent counseling patient regarding treatment plan.

## 2018-11-23 DIAGNOSIS — M6281 Muscle weakness (generalized): Secondary | ICD-10-CM | POA: Diagnosis not present

## 2018-11-23 DIAGNOSIS — M542 Cervicalgia: Secondary | ICD-10-CM | POA: Diagnosis not present

## 2018-11-24 LAB — PAIN MGMT, PROFILE 6 CONF W/O MM, U
6 Acetylmorphine: NEGATIVE ng/mL
Alcohol Metabolites: POSITIVE ng/mL — AB (ref <500)
Alphahydroxyalprazolam: NEGATIVE ng/mL
Alphahydroxymidazolam: NEGATIVE ng/mL
Alphahydroxytriazolam: NEGATIVE ng/mL
Aminoclonazepam: 85 ng/mL
Amphetamines: NEGATIVE ng/mL
Barbiturates: NEGATIVE ng/mL
Benzodiazepines: POSITIVE ng/mL
Cocaine Metabolite: NEGATIVE ng/mL
Creatinine: 86 mg/dL
Ethyl Glucuronide (ETG): 5160 ng/mL
Ethyl Sulfate (ETS): 570 ng/mL
Hydroxyethylflurazepam: NEGATIVE ng/mL
Lorazepam: NEGATIVE ng/mL
Marijuana Metabolite: NEGATIVE ng/mL
Methadone Metabolite: NEGATIVE ng/mL
Nordiazepam: NEGATIVE ng/mL
Opiates: NEGATIVE ng/mL
Oxazepam: NEGATIVE ng/mL
Oxidant: NEGATIVE ug/mL
Oxycodone: NEGATIVE ng/mL
Phencyclidine: NEGATIVE ng/mL
Temazepam: NEGATIVE ng/mL
pH: 6.6 (ref 4.5–9.0)

## 2018-11-25 DIAGNOSIS — M542 Cervicalgia: Secondary | ICD-10-CM | POA: Diagnosis not present

## 2018-11-25 DIAGNOSIS — M6281 Muscle weakness (generalized): Secondary | ICD-10-CM | POA: Diagnosis not present

## 2018-11-25 NOTE — Progress Notes (Signed)
UDS did show alcohol while on pain contract avoid alcohol. Do not drink and take norco at the same time.

## 2018-11-29 DIAGNOSIS — M6281 Muscle weakness (generalized): Secondary | ICD-10-CM | POA: Diagnosis not present

## 2018-11-29 DIAGNOSIS — M542 Cervicalgia: Secondary | ICD-10-CM | POA: Diagnosis not present

## 2018-12-02 DIAGNOSIS — M542 Cervicalgia: Secondary | ICD-10-CM | POA: Diagnosis not present

## 2018-12-02 DIAGNOSIS — M6281 Muscle weakness (generalized): Secondary | ICD-10-CM | POA: Diagnosis not present

## 2018-12-03 ENCOUNTER — Ambulatory Visit (INDEPENDENT_AMBULATORY_CARE_PROVIDER_SITE_OTHER): Payer: Medicare HMO | Admitting: Specialist

## 2018-12-03 ENCOUNTER — Ambulatory Visit (INDEPENDENT_AMBULATORY_CARE_PROVIDER_SITE_OTHER): Payer: Medicare HMO | Admitting: Orthopaedic Surgery

## 2018-12-03 ENCOUNTER — Other Ambulatory Visit: Payer: Self-pay

## 2018-12-03 ENCOUNTER — Encounter (INDEPENDENT_AMBULATORY_CARE_PROVIDER_SITE_OTHER): Payer: Self-pay | Admitting: Specialist

## 2018-12-03 VITALS — BP 139/77 | HR 67 | Ht 70.0 in | Wt 202.0 lb

## 2018-12-03 DIAGNOSIS — M5412 Radiculopathy, cervical region: Secondary | ICD-10-CM

## 2018-12-03 DIAGNOSIS — M542 Cervicalgia: Secondary | ICD-10-CM

## 2018-12-03 DIAGNOSIS — M4722 Other spondylosis with radiculopathy, cervical region: Secondary | ICD-10-CM | POA: Diagnosis not present
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
## 2018-12-19 ENCOUNTER — Other Ambulatory Visit: Payer: Medicare HMO

## 2018-12-26 ENCOUNTER — Ambulatory Visit (INDEPENDENT_AMBULATORY_CARE_PROVIDER_SITE_OTHER): Payer: Medicare HMO

## 2018-12-26 ENCOUNTER — Other Ambulatory Visit: Payer: Self-pay

## 2018-12-26 DIAGNOSIS — M5412 Radiculopathy, cervical region: Secondary | ICD-10-CM

## 2018-12-26 DIAGNOSIS — M4722 Other spondylosis with radiculopathy, cervical region: Secondary | ICD-10-CM

## 2018-12-26 DIAGNOSIS — M542 Cervicalgia: Secondary | ICD-10-CM | POA: Diagnosis not present

## 2019-01-04 ENCOUNTER — Telehealth: Payer: Self-pay

## 2019-01-04 NOTE — Telephone Encounter (Signed)
Tried calling patient back to schedule an appointment for MRI Review, but no answer and no VM setup to leave a message.

## 2019-01-05 ENCOUNTER — Ambulatory Visit: Payer: Medicare HMO | Admitting: Specialist

## 2019-01-05 ENCOUNTER — Other Ambulatory Visit: Payer: Self-pay

## 2019-01-05 ENCOUNTER — Encounter: Payer: Self-pay | Admitting: Specialist

## 2019-01-05 VITALS — BP 110/61 | HR 47 | Ht 70.0 in | Wt 202.0 lb

## 2019-01-05 DIAGNOSIS — M4722 Other spondylosis with radiculopathy, cervical region: Secondary | ICD-10-CM | POA: Diagnosis not present

## 2019-01-05 DIAGNOSIS — M4802 Spinal stenosis, cervical region: Secondary | ICD-10-CM | POA: Diagnosis not present

## 2019-01-05 DIAGNOSIS — M542 Cervicalgia: Secondary | ICD-10-CM | POA: Diagnosis not present
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
## 2019-01-05 NOTE — Patient Instructions (Signed)
Plan: Avoid overhead lifting and overhead use of the arms. Do not lift greater than 5 lbs. Adjust head rest in vehicle to prevent hyperextension if rear ended. Take extra precautions to avoid falling. EMG/NCV ordered to assess for localizing the radicular level left arm. Most appears C6 most Significant cord compression is C3-4 and C4-5.

## 2019-01-17 ENCOUNTER — Telehealth: Payer: Self-pay | Admitting: Specialist

## 2019-01-17 ENCOUNTER — Other Ambulatory Visit: Payer: Self-pay | Admitting: Radiology

## 2019-01-17 DIAGNOSIS — M5412 Radiculopathy, cervical region: Secondary | ICD-10-CM

## 2019-01-17 NOTE — Telephone Encounter (Signed)
Patient called advised there were to Rx that was suppose to be called into the pharmacy. Patient said he could not remember what the name of the medications. The number to contact patient is [phone]

## 2019-01-18 NOTE — Telephone Encounter (Signed)
I called and advised pt that it doesn't look like Dr. Louanne Skye was going to send in any meds for him at this time, however there has been an order for the EMG/NCS put in and Dr. Romona Curls Staff will call him to schedule that.  He states that he understands

## 2019-01-20 ENCOUNTER — Ambulatory Visit (INDEPENDENT_AMBULATORY_CARE_PROVIDER_SITE_OTHER): Payer: Medicare HMO | Admitting: Physician Assistant

## 2019-01-20 ENCOUNTER — Encounter: Payer: Self-pay | Admitting: Physician Assistant

## 2019-01-20 VITALS — BP 132/64 | HR 65 | Temp 98.7°F | Ht 70.0 in | Wt 190.0 lb

## 2019-01-20 DIAGNOSIS — T39395A Adverse effect of other nonsteroidal anti-inflammatory drugs [NSAID], initial encounter: Secondary | ICD-10-CM | POA: Diagnosis not present

## 2019-01-20 DIAGNOSIS — R112 Nausea with vomiting, unspecified: Secondary | ICD-10-CM | POA: Diagnosis not present

## 2019-01-20 DIAGNOSIS — R1084 Generalized abdominal pain: Secondary | ICD-10-CM

## 2019-01-20 DIAGNOSIS — K2960 Other gastritis without bleeding: Secondary | ICD-10-CM | POA: Insufficient documentation

## 2019-01-20 HISTORY — DX: Adverse effect of other nonsteroidal anti-inflammatory drugs (NSAID), initial encounter: K29.60

## 2019-01-20 MED ORDER — AMOXICILLIN-POT CLAVULANATE 875-125 MG PO TABS: 1.0000 | ORAL_TABLET | Freq: Two times a day (BID) | ORAL | 0 refills | Status: DC

## 2019-01-20 MED ORDER — ALUM & MAG HYDROXIDE-SIMETH 200-200-20 MG/5ML PO SUSP
30.0000 mL | Freq: Once | ORAL | Status: AC
  Administered 2019-01-20: 30 mL via ORAL

## 2019-01-20 MED ORDER — SUCRALFATE 1 G PO TABS: 1.0000 g | ORAL_TABLET | Freq: Four times a day (QID) | ORAL | 0 refills | Status: DC

## 2019-01-20 MED ORDER — HYOSCYAMINE SULFATE 0.125 MG SL SUBL
0.2500 mg | SUBLINGUAL_TABLET | Freq: Once | SUBLINGUAL | Status: AC
  Administered 2019-01-20: 0.25 mg via SUBLINGUAL

## 2019-01-20 MED ORDER — LIDOCAINE VISCOUS HCL 2 % MT SOLN
15.0000 mL | Freq: Once | OROMUCOSAL | Status: AC
  Administered 2019-01-20: 15 mL via ORAL

## 2019-01-20 NOTE — Progress Notes (Signed)
Subjective:    Patient ID: Alexander [name], male    DOB: 1950/11/30, 68 y.o.   MRN: [id number]  HPI  Patient is a 68 year old male with hypertension, GERD(barretts esophagus), IBS, cervical radiculopathy who presents to the clinic with 2 weeks of abdominal pain, nausea and most recently vomiting.  He is only been vomiting for the last 3 days.  He has some Zofran at home that helps some with the actual vomiting but he remains nauseated.  He has been treated aggressively for the last 4 months for his cervical radiculopathy.  He does admit to taking meloxicam daily.  He denies any black tarry stools or bright red blood in stools.  He continues to take his Protonix daily.  He denies any fever, chills, shortness of breath, cough. He does have some intermittent loose stools but no diarrhea. He does have a hx of diverticulitis and had to have part of his colon removed.   .. Active Ambulatory Problems    Diagnosis Date Noted  . Essential hypertension, benign 09/30/2013  . Barrett's esophagus 09/30/2013  . Impaired fasting glucose 09/30/2013  . BPH (benign prostatic hyperplasia) 09/30/2013  . Bipolar 1 disorder, mixed ([place]) 09/30/2013  . Generalized anxiety disorder 09/30/2013  . Hyperlipidemia 10/03/2013  . History of diverticulitis 10/20/2013  . Preventive measure 10/20/2013  . OSA on CPAP 12/28/2013  . Osteoarthritis of carpometacarpal joint of left thumb 01/05/2015  . Ear mass, left 06/06/2016  . Need for influenza vaccination 03/25/2017  . Chronic pain of right knee 03/25/2017  . Epidermal cyst 03/25/2017  . Seborrheic keratoses 03/25/2017  . RLS (restless legs syndrome) 03/25/2017  . Diarrhea 03/25/2017  . Hematoma 04/24/2017  . Excessive bleeding 04/24/2017  . Lipoma of neck 04/24/2017  . Acute non-recurrent pansinusitis 08/12/2017  . Memory changes 08/30/2017  . Elevated fasting glucose 08/30/2017  . Cough 08/30/2017  . Reactive airway disease that is not asthma 09/13/2017  .  Bilateral lower extremity edema 09/13/2017  . Low libido 10/08/2017  . Irritable bowel syndrome with diarrhea 10/08/2017  . Vision changes 10/08/2017  . Irregular heart beat 01/27/2018  . LVH (left ventricular hypertrophy) 01/27/2018  . Neck pain 01/27/2018  . Serum calcium elevated 01/27/2018  . Elevated bilirubin 01/27/2018  . Loose stools 01/27/2018  . DDD (degenerative disc disease), cervical 01/29/2018  . Osteoarthritis of facet joint of cervical spine 04/26/2018  . Accessory skin tags 04/28/2018  . Erectile dysfunction 07/15/2018  . Cervical spondylosis 09/15/2018  . Chronic pain disorder 11/01/2018  . Cervical radiculopathy 11/01/2018  . Spinal stenosis of cervical region 11/05/2018  . NSAID induced gastritis 01/20/2019   Resolved Ambulatory Problems    Diagnosis Date Noted  . No Resolved Ambulatory Problems   No Additional Past Medical History      Review of Systems See HPI.     Objective:   Physical Exam Vitals signs reviewed.  Constitutional:      Appearance: He is well-developed.  Abdominal:     General: Bowel sounds are normal. There is distension.     Palpations: Abdomen is soft.     Tenderness: There is generalized abdominal tenderness. There is no right CVA tenderness or left CVA tenderness. Negative signs include Murphy's sign, McBurney's sign and psoas sign.     Comments: Well healed scar from sternum past umbilcus.   Neurological:     General: No focal deficit present.     Mental Status: He is alert.  Psychiatric:  Mood and Affect: Mood normal.        Behavior: Behavior normal.           Assessment & Plan:  [name] KitchenMarland KitchenElenore Rota L was seen today for abdominal pain.  Diagnoses and all orders for this visit:  Generalized abdominal pain -     CBC with Differential/Platelet -     COMPLETE METABOLIC PANEL WITH GFR -     Lipase -     amoxicillin-clavulanate (AUGMENTIN) 875-125 MG tablet; Take 1 tablet by mouth 2 (two) times daily. Take 1 tab twice  a day for 10 days. -     lidocaine (XYLOCAINE) 2 % viscous mouth solution 15 mL -     alum & mag hydroxide-simeth (MAALOX/MYLANTA) 200-200-20 MG/5ML suspension 30 mL -     hyoscyamine (LEVSIN SL) SL tablet 0.25 mg  Non-intractable vomiting with nausea, unspecified vomiting type -     CBC with Differential/Platelet -     COMPLETE METABOLIC PANEL WITH GFR -     Lipase -     lidocaine (XYLOCAINE) 2 % viscous mouth solution 15 mL -     alum & mag hydroxide-simeth (MAALOX/MYLANTA) 200-200-20 MG/5ML suspension 30 mL -     hyoscyamine (LEVSIN SL) SL tablet 0.25 mg  NSAID induced gastritis -     sucralfate (CARAFATE) 1 g tablet; Take 1 tablet (1 g total) by mouth 4 (four) times daily.   Suspect NSAID induced gastritis but with hx of diverticulitis will treat for both. STOP MOBIC. Given GI cocktail in office today. Make sure you are taking protonix. Start carafate TID for 2 weeks. Start augmentin for 10 days. zofran as needed. Follow up if worsening or symptoms changing. Will get CBC, lipase, CMP.   [name].Spent 30 minutes with patient and greater than 50 percent of visit spent counseling patient regarding treatment plan.

## 2019-01-20 NOTE — Patient Instructions (Addendum)
GI cocktail in office.   STOP mobic.  Make sure taking pantoprazole 40mg  twice a day.  Start carafate three times a day for 2 weeks.  Start Augmentin for 10 days.   Ok to continue zofran as needed.   Let me know how doing Monday morning.

## 2019-01-21 LAB — COMPLETE METABOLIC PANEL WITH GFR
AG Ratio: 1.9 (calc) (ref 1.0–2.5)
ALT: 30 U/L (ref 9–46)
AST: 18 U/L (ref 10–35)
Albumin: 4.5 g/dL (ref 3.6–5.1)
Alkaline phosphatase (APISO): 84 U/L (ref 35–144)
BUN: 13 mg/dL (ref 7–25)
CO2: 26 mmol/L (ref 20–32)
Calcium: 11.2 mg/dL — ABNORMAL HIGH (ref 8.6–10.3)
Chloride: 106 mmol/L (ref 98–110)
Creat: 0.92 mg/dL (ref 0.70–1.25)
GFR, Est African American: 99 mL/min/{1.73_m2} (ref >=60)
GFR, Est Non African American: 85 mL/min/{1.73_m2} (ref >=60)
Globulin: 2.4 g/dL (calc) (ref 1.9–3.7)
Glucose, Bld: 117 mg/dL — ABNORMAL HIGH (ref 65–99)
Potassium: 4 mmol/L (ref 3.5–5.3)
Sodium: 140 mmol/L (ref 135–146)
Total Bilirubin: 1.3 mg/dL — ABNORMAL HIGH (ref 0.2–1.2)
Total Protein: 6.9 g/dL (ref 6.1–8.1)

## 2019-01-21 LAB — CBC WITH DIFFERENTIAL/PLATELET
Absolute Monocytes: 572 cells/uL (ref 200–950)
Basophils Absolute: 73 cells/uL (ref 0–200)
Basophils Relative: 0.7 %
Eosinophils Absolute: 94 cells/uL (ref 15–500)
Eosinophils Relative: 0.9 %
HCT: 44.6 % (ref 38.5–50.0)
Hemoglobin: 15.2 g/dL (ref 13.2–17.1)
Lymphs Abs: 1362 cells/uL (ref 850–3900)
MCH: 32.2 pg (ref 27.0–33.0)
MCHC: 34.1 g/dL (ref 32.0–36.0)
MCV: 94.5 fL (ref 80.0–100.0)
MPV: 11.7 fL (ref 7.5–12.5)
Monocytes Relative: 5.5 %
Neutro Abs: 8299 cells/uL — ABNORMAL HIGH (ref 1500–7800)
Neutrophils Relative %: 79.8 %
Platelets: 226 10*3/uL (ref 140–400)
RBC: 4.72 10*6/uL (ref 4.20–5.80)
RDW: 11.9 % (ref 11.0–15.0)
Total Lymphocyte: 13.1 %
WBC: 10.4 10*3/uL (ref 3.8–10.8)

## 2019-01-21 LAB — LIPASE: Lipase: 38 U/L (ref 7–60)

## 2019-01-24 NOTE — Progress Notes (Signed)
Increase vitamin D to 1000 units.

## 2019-01-24 NOTE — Progress Notes (Signed)
See how patients symptoms are today. No pancreatitis.  Calcium a little elevated. Are you taking vitamin D? If so how much?

## 2019-01-24 NOTE — Progress Notes (Signed)
Please start miralax 2 capfuls twice a day until stooling regularly with 4oz of gatorade. Then can decrease as needed.

## 2019-01-31 ENCOUNTER — Ambulatory Visit (INDEPENDENT_AMBULATORY_CARE_PROVIDER_SITE_OTHER): Payer: Medicare HMO | Admitting: Physician Assistant

## 2019-01-31 ENCOUNTER — Encounter: Payer: Self-pay | Admitting: Physician Assistant

## 2019-01-31 VITALS — Temp 98.5°F | Ht 70.0 in | Wt 189.0 lb

## 2019-01-31 DIAGNOSIS — M47812 Spondylosis without myelopathy or radiculopathy, cervical region: Secondary | ICD-10-CM | POA: Diagnosis not present

## 2019-01-31 DIAGNOSIS — R1032 Left lower quadrant pain: Secondary | ICD-10-CM

## 2019-01-31 DIAGNOSIS — M5030 Other cervical disc degeneration, unspecified cervical region: Secondary | ICD-10-CM

## 2019-01-31 DIAGNOSIS — R198 Other specified symptoms and signs involving the digestive system and abdomen: Secondary | ICD-10-CM | POA: Diagnosis not present

## 2019-01-31 DIAGNOSIS — K5900 Constipation, unspecified: Secondary | ICD-10-CM | POA: Diagnosis not present

## 2019-01-31 DIAGNOSIS — R140 Abdominal distension (gaseous): Secondary | ICD-10-CM | POA: Diagnosis not present

## 2019-01-31 DIAGNOSIS — G894 Chronic pain syndrome: Secondary | ICD-10-CM

## 2019-01-31 DIAGNOSIS — R1031 Right lower quadrant pain: Secondary | ICD-10-CM | POA: Diagnosis not present

## 2019-01-31 DIAGNOSIS — M5412 Radiculopathy, cervical region: Secondary | ICD-10-CM

## 2019-01-31 MED ORDER — HYDROCODONE-ACETAMINOPHEN 5-325 MG PO TABS: ORAL_TABLET | ORAL | 0 refills | Status: DC

## 2019-01-31 MED ORDER — GABAPENTIN 600 MG PO TABS: ORAL_TABLET | ORAL | 3 refills | Status: DC

## 2019-01-31 NOTE — Patient Instructions (Addendum)
Decrease amolodapin to 5mg  or 1/2 tablet of the 10mg .  Will order CT scan.  Continue miralax daily.    Orthostatic Hypotension Blood pressure is a measurement of how strongly, or weakly, your blood is pressing against the walls of your arteries. Orthostatic hypotension is a sudden drop in blood pressure that happens when you quickly change positions, such as when you get up from sitting or lying down. Arteries are blood vessels that carry blood from your heart throughout your body. When blood pressure is too low, you may not get enough blood to your brain or to the rest of your organs. This can cause weakness, light-headedness, rapid heartbeat, and fainting. This can last for just a few seconds or for up to a few minutes. Orthostatic hypotension is usually not a serious problem. However, if it happens frequently or gets worse, it may be a sign of something more serious. What are the causes? This condition may be caused by:  Sudden changes in posture, such as standing up quickly after you have been sitting or lying down.  Blood loss.  Loss of body fluids (dehydration).  Heart problems.  Hormone (endocrine) problems.  Pregnancy.  Severe infection.  Lack of certain nutrients.  Severe allergic reactions (anaphylaxis).  Certain medicines, such as blood pressure medicine or medicines that make the body lose excess fluids (diuretics). Sometimes, this condition can be caused by not taking medicine as directed, such as taking too much of a certain medicine. What increases the risk? The following factors may make you more likely to develop this condition:  Age. Risk increases as you get older.  Conditions that affect the heart or the central nervous system.  Taking certain medicines, such as blood pressure medicine or diuretics.  Being pregnant. What are the signs or symptoms? Symptoms of this condition may include:  Weakness.  Light-headedness.  Dizziness.  Blurred vision.   Fatigue.  Rapid heartbeat.  Fainting, in severe cases. How is this diagnosed? This condition is diagnosed based on:  Your medical history.  Your symptoms.  Your blood pressure measurement. Your health care provider will check your blood pressure when you are: ? Lying down. ? Sitting. ? Standing. A blood pressure reading is recorded as two numbers, such as "120 over 80" (or 120/80). The first ("top") number is called the systolic pressure. It is a measure of the pressure in your arteries as your heart beats. The second ("bottom") number is called the diastolic pressure. It is a measure of the pressure in your arteries when your heart relaxes between beats. Blood pressure is measured in a unit called mm Hg. Healthy blood pressure for most adults is 120/80. If your blood pressure is below 90/60, you may be diagnosed with hypotension. Other information or tests that may be used to diagnose orthostatic hypotension include:  Your other vital signs, such as your heart rate and temperature.  Blood tests.  Tilt table test. For this test, you will be safely secured to a table that moves you from a lying position to an upright position. Your heart rhythm and blood pressure will be monitored during the test. How is this treated? This condition may be treated by:  Changing your diet. This may involve eating more salt (sodium) or drinking more water.  Taking medicines to raise your blood pressure.  Changing the dosage of certain medicines you are taking that might be lowering your blood pressure.  Wearing compression stockings. These stockings help to prevent blood clots and reduce swelling  in your legs. In some cases, you may need to go to the hospital for:  Fluid replacement. This means you will receive fluids through an IV.  Blood replacement. This means you will receive donated blood through an IV (transfusion).  Treating an infection or heart problems, if this applies.  Monitoring.  You may need to be monitored while medicines that you are taking wear off. Follow these instructions at home: Eating and drinking   Drink enough fluid to keep your urine pale yellow.  Eat a healthy diet, and follow instructions from your health care provider about eating or drinking restrictions. A healthy diet includes: ? Fresh fruits and vegetables. ? Whole grains. ? Lean meats. ? Low-fat dairy products.  Eat extra salt only as directed. Do not add extra salt to your diet unless your health care provider told you to do that.  Eat frequent, small meals.  Avoid standing up suddenly after eating. Medicines  Take over-the-counter and prescription medicines only as told by your health care provider. ? Follow instructions from your health care provider about changing the dosage of your current medicines, if this applies. ? Do not stop or adjust any of your medicines on your own. General instructions   Wear compression stockings as told by your health care provider.  Get up slowly from lying down or sitting positions. This gives your blood pressure a chance to adjust.  Avoid hot showers and excessive heat as directed by your health care provider.  Return to your normal activities as told by your health care provider. Ask your health care provider what activities are safe for you.  Do not use any products that contain nicotine or tobacco, such as cigarettes, e-cigarettes, and chewing tobacco. If you need help quitting, ask your health care provider.  Keep all follow-up visits as told by your health care provider. This is important. Contact a health care provider if you:  Vomit.  Have diarrhea.  Have a fever for more than 2-3 days.  Feel more thirsty than usual.  Feel weak and tired. Get help right away if you:  Have chest pain.  Have a fast or irregular heartbeat.  Develop numbness in any part of your body.  Cannot move your arms or your legs.  Have trouble  speaking.  Become sweaty or feel light-headed.  Faint.  Feel short of breath.  Have trouble staying awake.  Feel confused. Summary  Orthostatic hypotension is a sudden drop in blood pressure that happens when you quickly change positions.  Orthostatic hypotension is usually not a serious problem.  It is diagnosed by having your blood pressure taken lying down, sitting, and then standing.  It may be treated by changing your diet or adjusting your medicines. This information is not intended to replace advice given to you by your health care provider. Make sure you discuss any questions you have with your health care provider. Document Released: 07/11/2002 Document Revised: 01/14/2018 Document Reviewed: 01/14/2018 Elsevier Patient Education  2020 [employer].

## 2019-01-31 NOTE — Progress Notes (Signed)
Subjective:    Patient ID: Alexander [name], male    DOB: 1950-11-18, 68 y.o.   MRN: [id number]  HPI  Pt is a 68 yo male with cervical radiculopathy, Barretts esophagus and  possible NSAID induced gastritis who is following up on abdominal pain.   He did stop mobic. He reports taking protonix and carafate although he does not feel like that made a difference. He went without a bM for 5 days then after miralax had some bowel movements that made him feel better. At this time he is having no epigastric pain but he feels solid and distended in his lower abdomen. He is taking miralax bid and stools are loose but do not feel full. Denies any black tarry or bright red blood in stool. No vomiting. He does take hydrocodone but usually one a day. He does need refill.   .. Active Ambulatory Problems    Diagnosis Date Noted  . Essential hypertension, benign 09/30/2013  . Barrett's esophagus 09/30/2013  . Impaired fasting glucose 09/30/2013  . BPH (benign prostatic hyperplasia) 09/30/2013  . Bipolar 1 disorder, mixed ([place]) 09/30/2013  . Generalized anxiety disorder 09/30/2013  . Hyperlipidemia 10/03/2013  . History of diverticulitis 10/20/2013  . Preventive measure 10/20/2013  . OSA on CPAP 12/28/2013  . Osteoarthritis of carpometacarpal joint of left thumb 01/05/2015  . Ear mass, left 06/06/2016  . Need for influenza vaccination 03/25/2017  . Chronic pain of right knee 03/25/2017  . Epidermal cyst 03/25/2017  . Seborrheic keratoses 03/25/2017  . RLS (restless legs syndrome) 03/25/2017  . Diarrhea 03/25/2017  . Hematoma 04/24/2017  . Excessive bleeding 04/24/2017  . Lipoma of neck 04/24/2017  . Acute non-recurrent pansinusitis 08/12/2017  . Memory changes 08/30/2017  . Elevated fasting glucose 08/30/2017  . Cough 08/30/2017  . Reactive airway disease that is not asthma 09/13/2017  . Bilateral lower extremity edema 09/13/2017  . Low libido 10/08/2017  . Irritable bowel syndrome with  diarrhea 10/08/2017  . Vision changes 10/08/2017  . Irregular heart beat 01/27/2018  . LVH (left ventricular hypertrophy) 01/27/2018  . Neck pain 01/27/2018  . Serum calcium elevated 01/27/2018  . Elevated bilirubin 01/27/2018  . Loose stools 01/27/2018  . DDD (degenerative disc disease), cervical 01/29/2018  . Osteoarthritis of facet joint of cervical spine 04/26/2018  . Accessory skin tags 04/28/2018  . Erectile dysfunction 07/15/2018  . Cervical spondylosis 09/15/2018  . Chronic pain disorder 11/01/2018  . Cervical radiculopathy 11/01/2018  . Spinal stenosis of cervical region 11/05/2018  . NSAID induced gastritis 01/20/2019   Resolved Ambulatory Problems    Diagnosis Date Noted  . No Resolved Ambulatory Problems   No Additional Past Medical History         Review of Systems    see HPI.  Objective:   Physical Exam Vitals signs reviewed.  Constitutional:      Appearance: He is well-developed.  Abdominal:     General: A surgical scar is present. Bowel sounds are decreased. There is distension.     Tenderness: There is abdominal tenderness in the right lower quadrant and left lower quadrant. There is no guarding or rebound.     Comments: Firm lower right and left abdomen.   Neurological:     General: No focal deficit present.     Mental Status: He is alert.  Psychiatric:        Mood and Affect: Mood normal.           Assessment & Plan:  [name] KitchenMarland Kitchen  Alexander [name] was seen today for abdominal pain, nausea and dizziness.  Diagnoses and all orders for this visit:  Bilateral lower abdominal pain  Cervical spondylosis -     HYDROcodone-acetaminophen (NORCO/VICODIN) 5-325 MG tablet; Take one tablet daily as needed for moderate to severe pain. -     gabapentin (NEURONTIN) 600 MG tablet; Two tabs PO TID  Cervical radiculopathy -     HYDROcodone-acetaminophen (NORCO/VICODIN) 5-325 MG tablet; Take one tablet daily as needed for moderate to severe pain. -     gabapentin  (NEURONTIN) 600 MG tablet; Two tabs PO TID  Chronic pain disorder -     HYDROcodone-acetaminophen (NORCO/VICODIN) 5-325 MG tablet; Take one tablet daily as needed for moderate to severe pain. -     gabapentin (NEURONTIN) 600 MG tablet; Two tabs PO TID  DDD (degenerative disc disease), cervical -     HYDROcodone-acetaminophen (NORCO/VICODIN) 5-325 MG tablet; Take one tablet daily as needed for moderate to severe pain. -     gabapentin (NEURONTIN) 600 MG tablet; Two tabs PO TID  Distended abdomen  Abdominal fullness  Constipation, unspecified constipation type  Abdominal distension (gaseous) -     DG Abd 2 Views   Will get xray first of abdomen. Unclear and I do wonder if just constipation. Not taking enough norco I would think to be the culprit.  Continue miralax. Stop carafate. Continue protonix.  [name].PDMP reviewed during this encounter. No concerns.  Refilled today.

## 2019-02-01 ENCOUNTER — Encounter: Payer: Self-pay | Admitting: Physician Assistant

## 2019-02-02 ENCOUNTER — Ambulatory Visit (INDEPENDENT_AMBULATORY_CARE_PROVIDER_SITE_OTHER): Payer: Medicare HMO | Admitting: Physical Medicine and Rehabilitation

## 2019-02-02 ENCOUNTER — Other Ambulatory Visit: Payer: Self-pay

## 2019-02-02 DIAGNOSIS — R202 Paresthesia of skin: Secondary | ICD-10-CM | POA: Diagnosis not present

## 2019-02-02 NOTE — Progress Notes (Signed)
 .  Numeric Pain Rating Scale and Functional Assessment Average Pain 6   In the last MONTH (on 0-10 scale) has pain interfered with the following?  1. General activity like being  able to carry out your everyday physical activities such as walking, climbing stairs, carrying groceries, or moving a chair?  Rating(5)   
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
## 2019-02-03 NOTE — Procedures (Signed)
EMG & NCV Findings: Evaluation of the left median motor nerve showed reduced amplitude (4.7 mV) and decreased conduction velocity (Elbow-Wrist, 47 m/s).  The left median (across palm) sensory nerve showed prolonged distal peak latency (4.3 ms).  All remaining nerves (as indicated in the following tables) were within normal limits.    All examined muscles (as indicated in the following table) showed no evidence of electrical instability.    Impression: The above electrodiagnostic study is ABNORMAL and reveals evidence of a mild to moderate left median nerve entrapment at the wrist (carpal tunnel syndrome) affecting sensory and motor components.   There is no significant electrodiagnostic evidence of any other focal nerve entrapment, brachial plexopathy or cervical radiculopathy.   Recommendations: 1.  Follow-up with referring physician. 2.  Continue current management of symptoms.  ___________________________ Laurence Spates FAAPMR Board Certified, American Board of Physical Medicine and Rehabilitation    Nerve Conduction Studies Anti Sensory Summary Table   Stim Site NR Peak (ms) Norm Peak (ms) P-T Amp (V) Norm P-T Amp Site1 Site2 Delta-P (ms) Dist (cm) Vel (m/s) Norm Vel (m/s)  Left Median Acr Palm Anti Sensory (2nd Digit)  32C  Wrist    *4.3 <3.6 18.0 >10        Site 3    2.2  13.1         Left Radial Anti Sensory (Base 1st Digit)  31.7C  Wrist    2.4 <3.1 14.6  Wrist Base 1st Digit 2.4 0.0    Left Ulnar Anti Sensory (5th Digit)  32C  Wrist    3.6 <3.7 17.2 >15.0 Wrist 5th Digit 3.6 14.0 39 >38   Motor Summary Table   Stim Site NR Onset (ms) Norm Onset (ms) O-P Amp (mV) Norm O-P Amp Site1 Site2 Delta-0 (ms) Dist (cm) Vel (m/s) Norm Vel (m/s)  Left Median Motor (Abd Poll Brev)  31.7C  Wrist    4.0 <4.2 *4.7 >5 Elbow Wrist 4.8 22.5 *47 >50  Elbow    8.8  4.3         Left Ulnar Motor (Abd Dig Min)  31.9C  Wrist    3.2 <4.2 8.0 >3 B Elbow Wrist 3.9 22.0 56 >53  B Elbow    7.1   8.7  A Elbow B Elbow 1.2 10.0 83 >53  A Elbow    8.3  8.5          EMG   Side Muscle Nerve Root Ins Act Fibs Psw Amp Dur Poly Recrt Int Fraser Din Comment  Left 1stDorInt Ulnar C8-T1 Nml Nml Nml Nml Nml 0 Nml Nml   Left Abd Poll Brev Median C8-T1 Nml Nml Nml Nml Nml 0 Nml Nml   Left ExtDigCom   Nml Nml Nml Nml Nml 0 Nml Nml   Left Triceps Radial C6-7-8 Nml Nml Nml Nml Nml 0 Nml Nml   Left Deltoid Axillary C5-6 Nml Nml Nml Nml Nml 0 Nml Nml     Nerve Conduction Studies Anti Sensory Left/Right Comparison   Stim Site L Lat (ms) R Lat (ms) L-R Lat (ms) L Amp (V) R Amp (V) L-R Amp (%) Site1 Site2 L Vel (m/s) R Vel (m/s) L-R Vel (m/s)  Median Acr Palm Anti Sensory (2nd Digit)  32C  Wrist *4.3   18.0         Site 3 2.2   13.1         Radial Anti Sensory (Base 1st Digit)  31.7C  Wrist 2.4  14.6   Wrist Base 1st Digit     Ulnar Anti Sensory (5th Digit)  32C  Wrist 3.6   17.2   Wrist 5th Digit 39     Motor Left/Right Comparison   Stim Site L Lat (ms) R Lat (ms) L-R Lat (ms) L Amp (mV) R Amp (mV) L-R Amp (%) Site1 Site2 L Vel (m/s) R Vel (m/s) L-R Vel (m/s)  Median Motor (Abd Poll Brev)  31.7C  Wrist 4.0   *4.7   Elbow Wrist *47    Elbow 8.8   4.3         Ulnar Motor (Abd Dig Min)  31.9C  Wrist 3.2   8.0   B Elbow Wrist 56    B Elbow 7.1   8.7   A Elbow B Elbow 83    A Elbow 8.3   8.5            Waveforms:

## 2019-02-09 ENCOUNTER — Ambulatory Visit (INDEPENDENT_AMBULATORY_CARE_PROVIDER_SITE_OTHER): Payer: Medicare HMO | Admitting: Specialist

## 2019-02-09 ENCOUNTER — Encounter: Payer: Self-pay | Admitting: Specialist

## 2019-02-09 ENCOUNTER — Other Ambulatory Visit: Payer: Self-pay

## 2019-02-09 VITALS — BP 119/66 | HR 52 | Ht 70.0 in | Wt 191.0 lb

## 2019-02-09 DIAGNOSIS — M4802 Spinal stenosis, cervical region: Secondary | ICD-10-CM

## 2019-02-09 DIAGNOSIS — M4722 Other spondylosis with radiculopathy, cervical region: Secondary | ICD-10-CM

## 2019-02-09 NOTE — Progress Notes (Signed)
Office Visit Note   Patient: Alexander [name]           Date of Birth: [birth year]           MRN: [id number] Visit Date: 02/09/2019              Requested by: Donella Stade, PA-C [address],  [place] [address] PCP: Donella Stade, PA-C   Assessment & Plan: Visit Diagnoses:  1. Other spondylosis with radiculopathy, cervical region   2. Spinal stenosis of cervical region     Plan:Avoid overhead lifting and overhead use of the arms. Do not lift greater than 5 lbs. Adjust head rest in vehicle to prevent hyperextension if rear ended. Take extra precautions to avoid falling. . Dr. Romona Curls secretary/Assistant will call to arrange for epidural steroid injection  Physical therapy with [facility] ([facility]) with heat, massage, Cervical McKenzie exercises and cervical traction.TENs trial, he has his own unit.  Follow-Up Instructions: No follow-ups on file.   Orders:  No orders of the defined types were placed in this encounter.  No orders of the defined types were placed in this encounter.     Procedures: No procedures performed   Clinical Data: No additional findings.   Subjective: Chief Complaint  Patient presents with  . Left Arm - Follow-up    Post NCS/EMG Studies    HPI  Review of Systems   Objective: Vital Signs: BP 119/66 (BP Location: Left Arm, Patient Position: Sitting)   Pulse (!) 52   Ht 5\' 10"  (1.778 m)   Wt 191 lb (86.6 kg)   BMI 27.41 kg/m   Physical Exam  Ortho Exam  Specialty Comments:  No specialty comments available.  Imaging: No results found.   PMFS History: Patient Active Problem List   Diagnosis Date Noted  . NSAID induced gastritis 01/20/2019  . Spinal stenosis of cervical region 11/05/2018  . Chronic pain disorder 11/01/2018  . Cervical radiculopathy 11/01/2018  . Cervical spondylosis 09/15/2018  . Erectile dysfunction 07/15/2018  . Accessory skin tags 04/28/2018  . Osteoarthritis of  facet joint of cervical spine 04/26/2018  . DDD (degenerative disc disease), cervical 01/29/2018  . Irregular heart beat 01/27/2018  . LVH (left ventricular hypertrophy) 01/27/2018  . Neck pain 01/27/2018  . Serum calcium elevated 01/27/2018  . Elevated bilirubin 01/27/2018  . Loose stools 01/27/2018  . Low libido 10/08/2017  . Irritable bowel syndrome with diarrhea 10/08/2017  . Vision changes 10/08/2017  . Reactive airway disease that is not asthma 09/13/2017  . Bilateral lower extremity edema 09/13/2017  . Memory changes 08/30/2017  . Elevated fasting glucose 08/30/2017  . Cough 08/30/2017  . Acute non-recurrent pansinusitis 08/12/2017  . Hematoma 04/24/2017  . Excessive bleeding 04/24/2017  . Lipoma of neck 04/24/2017  . Need for influenza vaccination 03/25/2017  . Chronic pain of right knee 03/25/2017  . Epidermal cyst 03/25/2017  . Seborrheic keratoses 03/25/2017  . RLS (restless legs syndrome) 03/25/2017  . Diarrhea 03/25/2017  . Ear mass, left 06/06/2016  . Osteoarthritis of carpometacarpal joint of left thumb 01/05/2015  . OSA on CPAP 12/28/2013  . History of diverticulitis 10/20/2013  . Preventive measure 10/20/2013  . Hyperlipidemia 10/03/2013  . Essential hypertension, benign 09/30/2013  . Barrett's esophagus 09/30/2013  . Impaired fasting glucose 09/30/2013  . BPH (benign prostatic hyperplasia) 09/30/2013  . Bipolar 1 disorder, mixed ([place]) 09/30/2013  . Generalized anxiety disorder 09/30/2013   Past Medical History:  Diagnosis  Date  . Barrett's esophagus 09/30/2013  . Bipolar 1 disorder, mixed ([place]) 09/30/2013   [facility], Dr. Ardine Eng   . BPH (benign prostatic hyperplasia) 09/30/2013  . Essential hypertension, benign 09/30/2013    History reviewed. No pertinent family history.  Past Surgical History:  Procedure Laterality Date  . COLOSTOMY REVERSAL    . large bowel perforation     Social History   Occupational History  . Not on file  Tobacco Use   . Smoking status: Former Smoker    Quit date: 09/30/1980    Years since quitting: 38.3  . Smokeless tobacco: Never Used  Substance and Sexual Activity  . Alcohol use: Yes    Alcohol/week: 14.0 standard drinks    Types: 14 Standard drinks or equivalent per week  . Drug use: No  . Sexual activity: Not Currently    Partners: Female

## 2019-02-09 NOTE — Patient Instructions (Signed)
Plan:Avoid overhead lifting and overhead use of the arms. Do not lift greater than 5 lbs. Adjust head rest in vehicle to prevent hyperextension if rear ended. Take extra precautions to avoid falling. . Dr. Romona Curls secretary/Assistant will call to arrange for epidural steroid injection  Physical therapy with [facility] ([facility]) with heat, massage, Cervical McKenzie exercises and cervical traction.TENs trial, he has his own unit.

## 2019-02-11 ENCOUNTER — Other Ambulatory Visit: Payer: Self-pay | Admitting: Physician Assistant

## 2019-02-11 DIAGNOSIS — H524 Presbyopia: Secondary | ICD-10-CM | POA: Diagnosis not present

## 2019-02-11 DIAGNOSIS — H2513 Age-related nuclear cataract, bilateral: Secondary | ICD-10-CM | POA: Diagnosis not present

## 2019-02-11 DIAGNOSIS — H5213 Myopia, bilateral: Secondary | ICD-10-CM | POA: Diagnosis not present

## 2019-02-11 DIAGNOSIS — K2960 Other gastritis without bleeding: Secondary | ICD-10-CM

## 2019-02-14 ENCOUNTER — Telehealth: Payer: Self-pay | Admitting: Specialist

## 2019-02-14 DIAGNOSIS — R69 Illness, unspecified: Secondary | ICD-10-CM | POA: Diagnosis not present

## 2019-02-14 NOTE — Telephone Encounter (Signed)
Patient called stated Alexander [name] put in referral to [facility]. Pt states he told him he wanted [facility].  Please call patient to advise. [phone]

## 2019-02-14 NOTE — Telephone Encounter (Signed)
I completed new PT referral to [facility]. Patient is aware this has been done.

## 2019-02-17 DIAGNOSIS — M6281 Muscle weakness (generalized): Secondary | ICD-10-CM | POA: Diagnosis not present

## 2019-02-17 DIAGNOSIS — M542 Cervicalgia: Secondary | ICD-10-CM | POA: Diagnosis not present

## 2019-02-22 DIAGNOSIS — M542 Cervicalgia: Secondary | ICD-10-CM | POA: Diagnosis not present

## 2019-02-22 DIAGNOSIS — M6281 Muscle weakness (generalized): Secondary | ICD-10-CM | POA: Diagnosis not present

## 2019-02-24 DIAGNOSIS — M542 Cervicalgia: Secondary | ICD-10-CM | POA: Diagnosis not present

## 2019-02-24 DIAGNOSIS — M6281 Muscle weakness (generalized): Secondary | ICD-10-CM | POA: Diagnosis not present

## 2019-02-27 ENCOUNTER — Other Ambulatory Visit: Payer: Self-pay | Admitting: Physician Assistant

## 2019-02-27 DIAGNOSIS — K2960 Other gastritis without bleeding: Secondary | ICD-10-CM

## 2019-02-27 DIAGNOSIS — T39395A Adverse effect of other nonsteroidal anti-inflammatory drugs [NSAID], initial encounter: Secondary | ICD-10-CM

## 2019-03-01 DIAGNOSIS — M542 Cervicalgia: Secondary | ICD-10-CM | POA: Diagnosis not present

## 2019-03-01 DIAGNOSIS — M6281 Muscle weakness (generalized): Secondary | ICD-10-CM | POA: Diagnosis not present

## 2019-03-03 ENCOUNTER — Encounter: Payer: Self-pay | Admitting: Physical Medicine and Rehabilitation

## 2019-03-03 ENCOUNTER — Ambulatory Visit (INDEPENDENT_AMBULATORY_CARE_PROVIDER_SITE_OTHER): Payer: Medicare HMO | Admitting: Physical Medicine and Rehabilitation

## 2019-03-03 ENCOUNTER — Other Ambulatory Visit: Payer: Self-pay

## 2019-03-03 ENCOUNTER — Ambulatory Visit: Payer: Self-pay

## 2019-03-03 VITALS — BP 156/80 | HR 48

## 2019-03-03 DIAGNOSIS — M6281 Muscle weakness (generalized): Secondary | ICD-10-CM | POA: Diagnosis not present

## 2019-03-03 DIAGNOSIS — M5412 Radiculopathy, cervical region: Secondary | ICD-10-CM | POA: Diagnosis not present

## 2019-03-03 DIAGNOSIS — M542 Cervicalgia: Secondary | ICD-10-CM | POA: Diagnosis not present

## 2019-03-03 MED ORDER — METHYLPREDNISOLONE ACETATE 80 MG/ML IJ SUSP
80.0000 mg | Freq: Once | INTRAMUSCULAR | Status: AC
  Administered 2019-03-03: 80 mg

## 2019-03-03 NOTE — Progress Notes (Signed)
 .  Numeric Pain Rating Scale and Functional Assessment Average Pain 8   In the last MONTH (on 0-10 scale) has pain interfered with the following?  1. General activity like being  able to carry out your everyday physical activities such as walking, climbing stairs, carrying groceries, or moving a chair?  Rating(7)   +Driver, -BT, -Dye Allergies.  

## 2019-03-17 ENCOUNTER — Other Ambulatory Visit: Payer: Self-pay

## 2019-03-17 ENCOUNTER — Encounter: Payer: Self-pay | Admitting: Specialist

## 2019-03-17 ENCOUNTER — Ambulatory Visit (INDEPENDENT_AMBULATORY_CARE_PROVIDER_SITE_OTHER): Payer: Medicare HMO | Admitting: Specialist

## 2019-03-17 VITALS — BP 143/64 | HR 40 | Ht 70.5 in | Wt 193.5 lb

## 2019-03-17 DIAGNOSIS — M5020 Other cervical disc displacement, unspecified cervical region: Secondary | ICD-10-CM

## 2019-03-17 DIAGNOSIS — M5412 Radiculopathy, cervical region: Secondary | ICD-10-CM | POA: Diagnosis not present

## 2019-03-17 DIAGNOSIS — M5030 Other cervical disc degeneration, unspecified cervical region: Secondary | ICD-10-CM

## 2019-03-17 DIAGNOSIS — G894 Chronic pain syndrome: Secondary | ICD-10-CM

## 2019-03-17 DIAGNOSIS — G4733 Obstructive sleep apnea (adult) (pediatric): Secondary | ICD-10-CM | POA: Diagnosis not present

## 2019-03-17 DIAGNOSIS — M47812 Spondylosis without myelopathy or radiculopathy, cervical region: Secondary | ICD-10-CM | POA: Diagnosis not present

## 2019-03-17 MED ORDER — HYDROCODONE-ACETAMINOPHEN 5-325 MG PO TABS: ORAL_TABLET | ORAL | 0 refills | Status: DC

## 2019-03-17 NOTE — Procedures (Signed)
Cervical Epidural Steroid Injection - Interlaminar Approach with Fluoroscopic Guidance  Patient: Alexander [name]      Date of Birth: 1951/03/20 MRN: [id number] PCP: Donella Stade, PA-C      Visit Date: 03/03/2019   Universal Protocol:    Date/Time: 08/13/201:04 PM  Consent Given By: the patient  Position: PRONE  Additional Comments: Vital signs were monitored before and after the procedure. Patient was prepped and draped in the usual sterile fashion. The correct patient, procedure, and site was verified.   Injection Procedure Details:  Procedure Site One Meds Administered:  Meds ordered this encounter  Medications  . methylPREDNISolone acetate (DEPO-MEDROL) injection 80 mg     Laterality: Left  Location/Site: C7-T1  Needle size: 20 G  Needle type: Touhy  Needle Placement: Paramedian epidural space  Findings:  -Comments: Excellent flow of contrast into the epidural space.  Procedure Details: Using a paramedian approach from the side mentioned above, the region overlying the inferior lamina was localized under fluoroscopic visualization and the soft tissues overlying this structure were infiltrated with 4 ml. of 1% Lidocaine without Epinephrine. A # 20 gauge, Tuohy needle was inserted into the epidural space using a paramedian approach.  The epidural space was localized using loss of resistance along with lateral and contralateral oblique bi-planar fluoroscopic views.  After negative aspirate for air, blood, and CSF, a 2 ml. volume of Isovue-250 was injected into the epidural space and the flow of contrast was observed. Radiographs were obtained for documentation purposes.   The injectate was administered into the level noted above.  Additional Comments:  The patient tolerated the procedure well Dressing: 2 x 2 sterile gauze and Band-Aid    Post-procedure details: Patient was observed during the procedure. Post-procedure instructions were reviewed.  Patient  left the clinic in stable condition.

## 2019-03-17 NOTE — Progress Notes (Signed)
Alexander [name] - 68 y.o. male MRN [id number]  Date of birth: 14-Dec-1950  Office Visit Note: Visit Date: 03/03/2019 PCP: Donella Stade, PA-C Referred by: Donella Stade, PA-C  Subjective: Chief Complaint  Patient presents with  . Neck - Pain  . Left Shoulder - Pain   HPI:  Alexander [name] is a 68 y.o. male who comes in today At the request of Dr. Basil Dess for left C7-T1 interlaminar steroid injection.  Patient's had a left neck and shoulder pain with MRI evidence of C3-4 central stenosis.  Electrodiagnostic study showing more compression at the wrist without overt nerve damage from a radicular standpoint.  Ongoing pain without any relief with medication management and other therapy.  ROS Otherwise per HPI.  Assessment & Plan: Visit Diagnoses:  1. Cervical radiculopathy     Plan: No additional findings.   Meds & Orders:  Meds ordered this encounter  Medications  . methylPREDNISolone acetate (DEPO-MEDROL) injection 80 mg    Orders Placed This Encounter  Procedures  . XR C-ARM NO REPORT  . Epidural Steroid injection    Follow-up: Return if symptoms worsen or fail to improve.   Procedures: No procedures performed  Cervical Epidural Steroid Injection - Interlaminar Approach with Fluoroscopic Guidance  Patient: Alexander [name]      Date of Birth: 05-10-1951 MRN: [id number] PCP: Donella Stade, PA-C      Visit Date: 03/03/2019   Universal Protocol:    Date/Time: 08/13/201:04 PM  Consent Given By: the patient  Position: PRONE  Additional Comments: Vital signs were monitored before and after the procedure. Patient was prepped and draped in the usual sterile fashion. The correct patient, procedure, and site was verified.   Injection Procedure Details:  Procedure Site One Meds Administered:  Meds ordered this encounter  Medications  . methylPREDNISolone acetate (DEPO-MEDROL) injection 80 mg     Laterality: Left  Location/Site: C7-T1  Needle  size: 20 G  Needle type: Touhy  Needle Placement: Paramedian epidural space  Findings:  -Comments: Excellent flow of contrast into the epidural space.  Procedure Details: Using a paramedian approach from the side mentioned above, the region overlying the inferior lamina was localized under fluoroscopic visualization and the soft tissues overlying this structure were infiltrated with 4 ml. of 1% Lidocaine without Epinephrine. A # 20 gauge, Tuohy needle was inserted into the epidural space using a paramedian approach.  The epidural space was localized using loss of resistance along with lateral and contralateral oblique bi-planar fluoroscopic views.  After negative aspirate for air, blood, and CSF, a 2 ml. volume of Isovue-250 was injected into the epidural space and the flow of contrast was observed. Radiographs were obtained for documentation purposes.   The injectate was administered into the level noted above.  Additional Comments:  The patient tolerated the procedure well Dressing: 2 x 2 sterile gauze and Band-Aid    Post-procedure details: Patient was observed during the procedure. Post-procedure instructions were reviewed.  Patient left the clinic in stable condition.    Clinical History: MRI CERVICAL SPINE WITHOUT CONTRAST  TECHNIQUE: Multiplanar, multisequence MR imaging of the cervical spine was performed. No intravenous contrast was administered.  COMPARISON:  Cervical spine CT 10/13/2018 and MRI 07/26/2018  FINDINGS: Alignment: Normal.  Vertebrae: Persistent mild degenerative endplate and facet edema at C3-4. No fracture or suspicious osseous lesion.  Cord: Normal signal.  Posterior Fossa, vertebral arteries, paraspinal tissues: Unremarkable.  Disc levels:  Up to moderate multilevel disc space  narrowing, greatest at C4-5.  C2-3: Mild left facet arthrosis without disc herniation or stenosis, unchanged from the prior MRI.  C3-4: Disc bulging,  broad central disc protrusion, uncovertebral spurring, and severe left facet arthrosis result in moderate spinal stenosis with mild cord flattening and mild right and moderate left neural foraminal stenosis. Spinal stenosis may have minimally progressed since the prior MRI.  C4-5: Broad central disc protrusion, uncovertebral spurring, and severe left facet arthrosis result in moderate spinal stenosis with mild cord flattening and mild left neural foraminal stenosis, unchanged.  C5-6: Disc bulging and uncovertebral spurring result in mild spinal stenosis and mild-to-moderate bilateral neural foraminal stenosis, unchanged.  C6-7: Broad-based posterior disc osteophyte complex with asymmetric left uncovertebral spurring results in mild-to-moderate left neural foraminal stenosis without spinal stenosis, unchanged.  C7-T1: Disc bulging, left uncovertebral spurring, and facet arthrosis result in mild left neural foraminal stenosis without spinal stenosis, unchanged.  IMPRESSION: 1. Stable to slight progression of moderate spinal stenosis at C3-4. 2. Unchanged disc and facet degeneration elsewhere resulting in up to moderate spinal and neural foraminal stenosis as detailed above.   Electronically Signed   By: Logan Bores M.D.   On: 12/26/2018 15:35     Objective:  VS:  HT:    WT:   BMI:     BP:(!) 156/80  HR:(!) 48bpm  TEMP: ( )  RESP:  Physical Exam  Ortho Exam Imaging: No results found.

## 2019-03-17 NOTE — Patient Instructions (Signed)
Avoid overhead lifting and overhead use of the arms. Do not lift greater than 5 lbs. Adjust head rest in vehicle to prevent hyperextension if rear ended. Take extra precautions to avoid falling, including use of a cane if you feel weak. Scheduling secretary Kandice Hams. will call you to arrange for surgery for your cervical spine. If you wish a second opinion please let us know and we can arrange for you. If you have worsening arm or leg numbness or weakness please call or go to an ER. We will contact your cardiologist and primary care physicians to seek clearance for your surgery. Surgery will be an anterior cervical discectomy and fusion at the C3-4 and C4-5 level with decompression of the cervical spinal canal  and removal of bone and disc herniation pressing on the spinal cord with bone grafting and plate and screws, Local bone graft as well and allograft bone graft and vivigen.  Risks of surgery include risks of infection, bleeding and risks to the spinal cord and  Risks of sore throat and difficulty swallowing which should  Improve over the next 4-6 weeks following surgery. Surgery is indicated due to upper extremity radiculopathy, lermittes phenomena . In the future surgery at adjacent levels may be necessary but these levels do not appear to be related to your current symptoms or signs.
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
## 2019-04-04 ENCOUNTER — Encounter: Payer: Self-pay | Admitting: Physician Assistant

## 2019-04-04 ENCOUNTER — Other Ambulatory Visit: Payer: Self-pay

## 2019-04-04 ENCOUNTER — Ambulatory Visit (INDEPENDENT_AMBULATORY_CARE_PROVIDER_SITE_OTHER): Payer: Medicare HMO | Admitting: Physician Assistant

## 2019-04-04 VITALS — BP 148/62 | HR 40 | Ht 70.0 in | Wt 202.0 lb

## 2019-04-04 DIAGNOSIS — Z23 Encounter for immunization: Secondary | ICD-10-CM

## 2019-04-04 DIAGNOSIS — R001 Bradycardia, unspecified: Secondary | ICD-10-CM

## 2019-04-04 DIAGNOSIS — Z01818 Encounter for other preprocedural examination: Secondary | ICD-10-CM | POA: Diagnosis not present

## 2019-04-04 HISTORY — DX: Bradycardia, unspecified: R00.1

## 2019-04-04 NOTE — Progress Notes (Signed)
Subjective:    Patient ID: Alexander [name], male    DOB: 11-Jun-1951, 68 y.o.   MRN: [id number]  HPI  Pt is a 68 yo male with cervical spondylosis/radiculopathy and HTN who has upcoming cervical fusion scheduled for 04/19/2019.   Pt denies any SOB, chest tightness, weakness, CP, cough, SOB. He is here for surgical clearance.   .. Active Ambulatory Problems    Diagnosis Date Noted  . Essential hypertension, benign 09/30/2013  . Barrett's esophagus 09/30/2013  . Impaired fasting glucose 09/30/2013  . BPH (benign prostatic hyperplasia) 09/30/2013  . Bipolar 1 disorder, mixed ([place]) 09/30/2013  . Generalized anxiety disorder 09/30/2013  . Hyperlipidemia 10/03/2013  . History of diverticulitis 10/20/2013  . Preventive measure 10/20/2013  . OSA on CPAP 12/28/2013  . Osteoarthritis of carpometacarpal joint of left thumb 01/05/2015  . Ear mass, left 06/06/2016  . Need for influenza vaccination 03/25/2017  . Chronic pain of right knee 03/25/2017  . Epidermal cyst 03/25/2017  . Seborrheic keratoses 03/25/2017  . RLS (restless legs syndrome) 03/25/2017  . Diarrhea 03/25/2017  . Hematoma 04/24/2017  . Excessive bleeding 04/24/2017  . Lipoma of neck 04/24/2017  . Acute non-recurrent pansinusitis 08/12/2017  . Memory changes 08/30/2017  . Elevated fasting glucose 08/30/2017  . Cough 08/30/2017  . Reactive airway disease that is not asthma 09/13/2017  . Bilateral lower extremity edema 09/13/2017  . Low libido 10/08/2017  . Irritable bowel syndrome with diarrhea 10/08/2017  . Vision changes 10/08/2017  . Irregular heart beat 01/27/2018  . LVH (left ventricular hypertrophy) 01/27/2018  . Neck pain 01/27/2018  . Serum calcium elevated 01/27/2018  . Elevated bilirubin 01/27/2018  . Loose stools 01/27/2018  . DDD (degenerative disc disease), cervical 01/29/2018  . Osteoarthritis of facet joint of cervical spine 04/26/2018  . Accessory skin tags 04/28/2018  . Erectile dysfunction  07/15/2018  . Cervical spondylosis 09/15/2018  . Chronic pain disorder 11/01/2018  . Cervical radiculopathy 11/01/2018  . Spinal stenosis of cervical region 11/05/2018  . NSAID induced gastritis 01/20/2019  . Sinus bradycardia 04/04/2019   Resolved Ambulatory Problems    Diagnosis Date Noted  . No Resolved Ambulatory Problems   No Additional Past Medical History     Review of Systems  All other systems reviewed and are negative.      Objective:   Physical Exam Vitals signs reviewed.  HENT:     Head: Normocephalic and atraumatic.  Neck:     Vascular: No carotid bruit.  Cardiovascular:     Rate and Rhythm: Regular rhythm. Bradycardia present.     Pulses: Normal pulses.  Pulmonary:     Effort: Pulmonary effort is normal.     Breath sounds: Normal breath sounds.  Neurological:     General: No focal deficit present.     Mental Status: He is alert and oriented to person, place, and time.  Psychiatric:        Mood and Affect: Mood normal.        Behavior: Behavior normal.           Assessment & Plan:  [name] KitchenMarland KitchenElenore Rota [name] was seen today for bradycardia.  Diagnoses and all orders for this visit:  Preoperative clearance -     CBC with Differential/Platelet -     COMPLETE METABOLIC PANEL WITH GFR -     EKG 12-Lead -     Ambulatory referral to Cardiology  Flu vaccine need -     Flu Vaccine QUAD 36+ mos IM  Bradycardia -     EKG 12-Lead  Sinus bradycardia -     Ambulatory referral to Cardiology   EKG showed sinus rhythm at 39. Needs cardiology to clear before surgery. Pt has had a hx of sinus bradycardia. Pt is asymptomatic.   Ordered CBC/CMP. Medically cleared.

## 2019-04-04 NOTE — Patient Instructions (Signed)
Will send to cardiology for cardiac clearance.  Get labs done downstairs.

## 2019-04-05 LAB — COMPLETE METABOLIC PANEL WITH GFR
AG Ratio: 1.8 (calc) (ref 1.0–2.5)
ALT: 31 U/L (ref 9–46)
AST: 23 U/L (ref 10–35)
Albumin: 4.2 g/dL (ref 3.6–5.1)
Alkaline phosphatase (APISO): 89 U/L (ref 35–144)
BUN: 10 mg/dL (ref 7–25)
CO2: 26 mmol/L (ref 20–32)
Calcium: 10.7 mg/dL — ABNORMAL HIGH (ref 8.6–10.3)
Chloride: 107 mmol/L (ref 98–110)
Creat: 0.9 mg/dL (ref 0.70–1.25)
GFR, Est African American: 101 mL/min/{1.73_m2} (ref >=60)
GFR, Est Non African American: 87 mL/min/{1.73_m2} (ref >=60)
Globulin: 2.4 g/dL (calc) (ref 1.9–3.7)
Glucose, Bld: 118 mg/dL — ABNORMAL HIGH (ref 65–99)
Potassium: 4 mmol/L (ref 3.5–5.3)
Sodium: 140 mmol/L (ref 135–146)
Total Bilirubin: 1.1 mg/dL (ref 0.2–1.2)
Total Protein: 6.6 g/dL (ref 6.1–8.1)

## 2019-04-05 LAB — CBC WITH DIFFERENTIAL/PLATELET
Absolute Monocytes: 428 cells/uL (ref 200–950)
Basophils Absolute: 48 cells/uL (ref 0–200)
Basophils Relative: 0.7 %
Eosinophils Absolute: 163 cells/uL (ref 15–500)
Eosinophils Relative: 2.4 %
HCT: 42.5 % (ref 38.5–50.0)
Hemoglobin: 14.3 g/dL (ref 13.2–17.1)
Lymphs Abs: 1414 cells/uL (ref 850–3900)
MCH: 31.8 pg (ref 27.0–33.0)
MCHC: 33.6 g/dL (ref 32.0–36.0)
MCV: 94.7 fL (ref 80.0–100.0)
MPV: 11.2 fL (ref 7.5–12.5)
Monocytes Relative: 6.3 %
Neutro Abs: 4746 cells/uL (ref 1500–7800)
Neutrophils Relative %: 69.8 %
Platelets: 185 10*3/uL (ref 140–400)
RBC: 4.49 10*6/uL (ref 4.20–5.80)
RDW: 12.9 % (ref 11.0–15.0)
Total Lymphocyte: 20.8 %
WBC: 6.8 10*3/uL (ref 3.8–10.8)

## 2019-04-05 NOTE — Progress Notes (Signed)
Ok to send surgical clearance form pending cardiac clearance for bradycardia.

## 2019-04-06 ENCOUNTER — Other Ambulatory Visit: Payer: Self-pay

## 2019-04-06 ENCOUNTER — Ambulatory Visit (INDEPENDENT_AMBULATORY_CARE_PROVIDER_SITE_OTHER): Payer: Medicare HMO | Admitting: Cardiology

## 2019-04-06 ENCOUNTER — Encounter: Payer: Self-pay | Admitting: Cardiology

## 2019-04-06 VITALS — BP 122/64 | HR 44 | Ht 70.5 in | Wt 201.5 lb

## 2019-04-06 DIAGNOSIS — R001 Bradycardia, unspecified: Secondary | ICD-10-CM

## 2019-04-06 DIAGNOSIS — I10 Essential (primary) hypertension: Secondary | ICD-10-CM | POA: Diagnosis not present

## 2019-04-06 DIAGNOSIS — Z01810 Encounter for preprocedural cardiovascular examination: Secondary | ICD-10-CM

## 2019-04-06 NOTE — Patient Instructions (Signed)
Medication Instructions:  Your physician recommends that you continue on your current medications as directed. Please refer to the Current Medication list given to you today.  If you need a refill on your cardiac medications before your next appointment, please call your pharmacy.   Lab work: None  If you have labs (blood work) drawn today and your tests are completely normal, you will receive your results only by: [name] MyChart Message (if you have MyChart) OR . A paper copy in the mail If you have any lab test that is abnormal or we need to change your treatment, we will call you to review the results.  Testing/Procedures: You had an EKG today.   Your physician has recommended that you wear a ZIO monitor. ZIO monitors are medical devices that record the heart's electrical activity. Doctors most often use these monitors to diagnose arrhythmias. Arrhythmias are problems with the speed or rhythm of the heartbeat. The monitor is a small, portable device. You can wear one while you do your normal daily activities. This is usually used to diagnose what is causing palpitations/syncope (passing out). Wear for 3 days.   Follow-Up: At [facility], you and your health needs are our priority.  As part of our continuing mission to provide you with exceptional heart care, we have created designated Provider Care Teams.  These Care Teams include your primary Cardiologist (physician) and Advanced Practice Providers (APPs -  Physician Assistants and Nurse Practitioners) who all work together to provide you with the care you need, when you need it. You will need a follow up appointment as needed if symptoms worsen or fail to improve.

## 2019-04-06 NOTE — Progress Notes (Addendum)
Cardiology Office Note:    Date:  04/06/2019   ID:  Alexander [name], DOB 12-Dec-1950, MRN [id number]  PCP:  Donella Stade, PA-C  Cardiologist:  Shirlee More, MD   Referring MD: Donella Stade, PA-C  ASSESSMENT:    1. Preoperative cardiovascular examination   2. Sinus bradycardia   3. Essential hypertension, benign    PLAN:     His ZIO monitor shows frequent PVCs no concerning bradycardia proceed with his planned surgery.   In order of problems listed above:  1. His planned surgery is elective and its intermediate risk from a cardiology perspective.  His racked risk factors include hypertension well-controlled and asymptomatic sinus bradycardia.  His exercise tolerance is in the range of 6 to 7 months and at this time no think he needs further preoperative cardiac screening such as ischemia evaluation or echocardiogram.  I did ask him to apply a ZIO monitor for 48 hours on review of prior to surgery and alert anesthesia to avoid rate slowing medications and he may require intraoperative atropine or Isuprel I do not think there is any role for permanent or temporary pacemaker.  Postoperatively placed on a monitored bed for the first 24 hours EKG postoperative day 1 and 8 questions contact [facility]. 2. Asymptomatic normal normal AV nodal function await results of 0 but he is asymptomatic and no indication to consider permanent or temporary pacemaker.  I will ask his PCP to look at previous calcium and decide if he needs evaluation for hyper parathyroidism. 3. Stable well-controlled continue ACE inhibitor 4. Hyperlipidemia stable continue with statin  Next appointment as needed   Medication Adjustments/Labs and Tests Ordered: Current medicines are reviewed at length with the patient today.  Concerns regarding medicines are outlined above.  Orders Placed This Encounter  Procedures  . LONG TERM MONITOR (3-14 DAYS)  . EKG 12-Lead   No orders of the defined types were  placed in this encounter.    Chief Complaint  Patient presents with  . other    Cardiac clearance no complaints today. Meds reviewed verbally with pt.    History of Present Illness:    Alexander [name] is a 68 y.o. male who is being seen today for preoperative cardiology evaluation  at the request of Alden Hipp, Jade L, PA-C.  He is seen by me in the office today because of bradycardia I cannot access that EKG but he tells me his heart rate was 39 bpm.  When seen in my office today he is sinus bradycardia 44 bpm his EKG is otherwise normal he has normal AV conduction and no pattern of infarction or ischemia.  Is a very vigorous active man walks 1-1/2 miles every day works in the garden works in a shop at his home and he has no exercise intolerance shortness of breath palpitations syncope or chest pain.  He takes no rate slowing medications and has no history of heart disease ischemic congenital rheumatic or heart failure while in the office today I put a pulse meter on him walked him in with moderate activity as heart rate accelerated to 62 bpm and he had no symptoms.  He had preoperative labs performed with normal CBC CMP but he has mild hypercalcemia that may play a role in his bradycardia.  Bring it to the attention of his primary care physician. Past Medical History:  Diagnosis Date  . Barrett's esophagus 09/30/2013  . Bipolar 1 disorder, mixed ([place]) 09/30/2013   [facility], Dr. Ardine Eng   . BPH (benign prostatic hyperplasia) 09/30/2013  . Essential hypertension, benign 09/30/2013    Past Surgical History:  Procedure Laterality Date  . COLOSTOMY REVERSAL    . large bowel perforation      Current Medications: Current Meds  Medication Sig  . AMBULATORY NON FORMULARY MEDICATION Increase Auto-PAP machine with heated humidifier settings to 9-20cm H2O.  Dx: Obstructive sleep apnea.  [name] amLODipine (NORVASC) 10 MG tablet Take one tablet daily.  [name] aspirin 81 MG tablet Take 81 mg by mouth  daily.  [name] atorvastatin (LIPITOR) 20 MG tablet Take 1 tablet (20 mg total) by mouth daily.  [name] azelastine (ASTELIN) 0.1 % nasal spray Place 2 sprays into both nostrils 2 (two) times daily. Use in each nostril as directed  . benazepril (LOTENSIN) 20 MG tablet Take 1 tablet (20 mg total) by mouth daily.  . clonazePAM (KLONOPIN) 0.5 MG tablet Take 1 tablet (0.5 mg total) by mouth 2 (two) times daily as needed for anxiety.  . cyclobenzaprine (FLEXERIL) 10 MG tablet Take 1 tablet (10 mg total) by mouth 3 (three) times daily as needed for muscle spasms.  [name] doxazosin (CARDURA) 4 MG tablet Take 1 tablet (4 mg total) by mouth 2 (two) times daily.  [name] gabapentin (NEURONTIN) 600 MG tablet Two tabs PO TID  . HYDROcodone-acetaminophen (NORCO/VICODIN) 5-325 MG tablet Take one tablet daily as needed for moderate to severe pain.  . hyoscyamine (LEVBID) 0.375 MG 12 hr tablet Take 1 tablet (0.375 mg total) by mouth 2 (two) times daily.  [name] LITHIUM CARBONATE PO Take 450 mg by mouth. Take 2 1/2 tablets per day  . Misc Natural Products (TUMERSAID PO) Take by mouth.  . Omega-3 Fatty Acids (FISH OIL) 1000 MG CAPS One by mouth twice a day with meals.  . pantoprazole (PROTONIX) 40 MG tablet TAKE 1 TABLET (40 MG TOTAL) BY MOUTH 2 (TWO) TIMES DAILY.  [name] promethazine (PHENERGAN) 25 MG tablet Take 1 tablet (25 mg total) by mouth every 6 (six) hours as needed for nausea or vomiting.  [name] rOPINIRole (REQUIP) 0.25 MG tablet Take 1 tablet (0.25 mg total) by mouth at bedtime. For restless legs.  . sertraline (ZOLOFT) 100 MG tablet Take 100 mg by mouth daily.  . sucralfate (CARAFATE) 1 g tablet TAKE 1 TABLET (1 G TOTAL) BY MOUTH 4 (FOUR) TIMES DAILY.  . traZODone (DESYREL) 100 MG tablet TAKE 1 TO 3 TAB AT BED TIME     Allergies:   Effexor [venlafaxine]   Social History   Socioeconomic History  . Marital status: Married    Spouse name: Not on file  . Number of children: Not on file  . Years of education: Not on file  . Highest  education level: Not on file  Occupational History  . Not on file  Social Needs  . Financial resource strain: Not on file  . Food insecurity    Worry: Not on file    Inability: Not on file  . Transportation needs    Medical: Not on file    Non-medical: Not on file  Tobacco Use  . Smoking status: Former Smoker    Quit date: 09/30/1980    Years since quitting: 38.5  . Smokeless tobacco: Never Used  Substance and Sexual Activity  . Alcohol use: Yes    Alcohol/week: 14.0 standard drinks    Types: 14 Standard drinks or equivalent per week  . Drug use: No  . Sexual activity: Not Currently  Partners: Female  Lifestyle  . Physical activity    Days per week: Not on file    Minutes per session: Not on file  . Stress: Not on file  Relationships  . Social Herbalist on phone: Not on file    Gets together: Not on file    Attends religious service: Not on file    Active member of club or organization: Not on file    Attends meetings of clubs or organizations: Not on file    Relationship status: Not on file  Other Topics Concern  . Not on file  Social History Narrative  . Not on file     Family History: The patient's family history is not on file.  ROS:   ROS Please see the history of present illness.     All other systems reviewed and are negative.  EKGs/Labs/Other Studies Reviewed:    The following studies were reviewed today:   EKG:  EKG is  ordered today.  The ekg ordered today is personally reviewed and demonstrates sinus bradycardia 44 bpm no evidence of sinus node or AV block normal QRS morphology  Recent Labs: 04/04/2019: ALT 31; BUN 10; Creat 0.90; Hemoglobin 14.3; Platelets 185; Potassium 4.0; Sodium 140  Recent Lipid Panel    Component Value Date/Time   CHOL 159 09/07/2018 0000   TRIG 201 (H) 09/07/2018 0000   HDL 59 09/07/2018 0000   CHOLHDL 2.7 09/07/2018 0000   VLDL 27 07/25/2016 0938   LDLCALC 70 09/07/2018 0000    Physical Exam:    VS:   BP 122/64 (BP Location: Left Arm, Patient Position: Sitting, Cuff Size: Large)   Pulse (!) 44   Ht 5' 10.5" (1.791 m)   Wt 201 lb 8 oz (91.4 kg)   SpO2 99%   BMI 28.50 kg/m     Wt Readings from Last 3 Encounters:  04/06/19 201 lb 8 oz (91.4 kg)  04/04/19 202 lb (91.6 kg)  03/17/19 193 lb 8 oz (87.8 kg)     GEN:  Well nourished, well developed in no acute distress HEENT: Normal NECK: No JVD; No carotid bruits LYMPHATICS: No lymphadenopathy CARDIAC: RRR, no murmurs, rubs, gallops RESPIRATORY:  Clear to auscultation without rales, wheezing or rhonchi  ABDOMEN: Soft, non-tender, non-distended MUSCULOSKELETAL:  No edema; No deformity  SKIN: Warm and dry NEUROLOGIC:  Alert and oriented x 3 PSYCHIATRIC:  Normal affect     Signed, Shirlee More, MD  04/06/2019 11:10 AM    [place]

## 2019-04-12 ENCOUNTER — Other Ambulatory Visit (INDEPENDENT_AMBULATORY_CARE_PROVIDER_SITE_OTHER): Payer: Medicare HMO

## 2019-04-12 ENCOUNTER — Other Ambulatory Visit: Payer: Self-pay

## 2019-04-12 DIAGNOSIS — R001 Bradycardia, unspecified: Secondary | ICD-10-CM

## 2019-04-14 ENCOUNTER — Encounter: Payer: Self-pay | Admitting: Surgery

## 2019-04-14 ENCOUNTER — Ambulatory Visit (INDEPENDENT_AMBULATORY_CARE_PROVIDER_SITE_OTHER): Payer: Medicare HMO | Admitting: Surgery

## 2019-04-14 VITALS — BP 155/66 | HR 50 | Ht 70.5 in | Wt 194.0 lb

## 2019-04-14 DIAGNOSIS — M47812 Spondylosis without myelopathy or radiculopathy, cervical region: Secondary | ICD-10-CM

## 2019-04-14 NOTE — Progress Notes (Addendum)
[id number] [address] [place] [address] Phone: [phone] Fax: [phone]      Your procedure is scheduled on Tuesday 04/19/2019.  Report to [facility] Main Entrance "A" at 05:30 A.M., and check in at the Admitting office.  Call this number if you have problems the morning of surgery:  [phone]  Call [phone] if you have any questions prior to your surgery date Monday-Friday 8am-4pm    Remember:  Do not eat after midnight the night before your surgery  You may drink clear liquids until 04:30am the morning of your surgery.   Clear liquids allowed are: Water, Non-Citrus Juices (without pulp), Carbonated Beverages, Clear Tea, Black Coffee Only, and Gatorade   Enhanced Recovery after Surgery for Orthopedics Enhanced Recovery after Surgery is a protocol used to improve the stress on your body and your recovery after surgery.  Patient Instructions  . The night before surgery:  o No food after midnight. ONLY clear liquids after midnight  .  [name] The day of surgery (if you do NOT have diabetes):  o Drink ONE (1) Pre-Surgery Clear Ensure as directed.   o This drink was given to you during your hospital  pre-op appointment visit. o The pre-op nurse will instruct you on the time to drink the  Pre-Surgery Ensure depending on your surgery time. o Finish the drink at the designated time by the pre-op nurse.  o Nothing else to drink after completing the  Pre-Surgery Clear Ensure.  Please complete your PRE-SURGERY ENSURE that was provided to you by 04:30am the morning of surgery.  Please, if able, drink it in one sitting. DO NOT SIP.     Take these medicines the morning of surgery with A SIP OF WATER: Amlodipine (Norvasc) Atorvastatin (Lipitor) Clonazepam (Klonopin) - if needed Cyclobenzaprine (Flexeril) - if needed Doxazosin (Cardura) Gabapentin (Neurontin) Hydrocodone-acetaminophen (Norco/Vicodin) - if  needed Lithium Cardonate (Eskalith) Pantoprazole (Protonix) Sertraline (Zoloft)  Follow your surgeon's instructions on when to stop Aspirin.  If no instructions were given by your surgeon then you will need to call the office to get those instructions.     7 days prior to surgery STOP taking any Aspirin (unless otherwise instructed by your surgeon), Aleve, Naproxen, Ibuprofen, Motrin, Advil, Goody's, BC's, all herbal medications, fish oil, and all vitamins.    The Morning of Surgery  Do not wear jewelry.  Do not wear lotions, powders, colognes, or deodorant  Men may shave face and neck.  Do not bring valuables to the hospital.  [facility] is not responsible for any belongings or valuables.  IF you are a smoker, DO NOT Smoke 24 hours prior to surgery  IF you wear a CPAP at night please bring your mask, tubing, and machine the morning of surgery   Remember that you must have someone to transport you home after your surgery, and remain with you for 24 hours if you are discharged the same day.   Contacts, eyeglasses, hearing aids, dentures or bridgework may not be worn into surgery.    Leave your suitcase in the car.  After surgery it may be brought to your room.  For patients admitted to the hospital, discharge time will be determined by your treatment team.  Patients discharged the day of surgery will not be allowed to drive home.    Special instructions:   [place]- Preparing For Surgery  Before surgery, you can play an important role. Because skin  is not sterile, your skin needs to be as free of germs as possible. You can reduce the number of germs on your skin by washing with CHG (chlorahexidine gluconate) Soap before surgery.  CHG is an antiseptic cleaner which kills germs and bonds with the skin to continue killing germs even after washing.    Oral Hygiene is also important to reduce your risk of infection.  Remember - BRUSH YOUR TEETH THE MORNING OF SURGERY WITH YOUR  REGULAR TOOTHPASTE  Please do not use if you have an allergy to CHG or antibacterial soaps. If your skin becomes reddened/irritated stop using the CHG.  Do not shave (including legs and underarms) for at least 48 hours prior to first CHG shower. It is OK to shave your face.  Please follow these instructions carefully.   1. Shower the NIGHT BEFORE SURGERY and the MORNING OF SURGERY with CHG Soap.   2. If you chose to wash your hair, wash your hair first as usual with your normal shampoo.  3. After you shampoo, rinse your hair and body thoroughly to remove the shampoo.  4. Use CHG as you would any other liquid soap. You can apply CHG directly to the skin and wash gently with a scrungie or a clean washcloth.   5. Apply the CHG Soap to your body ONLY FROM THE NECK DOWN.  Do not use on open wounds or open sores. Avoid contact with your eyes, ears, mouth and genitals (private parts). Wash Face and genitals (private parts)  with your normal soap.   6. Wash thoroughly, paying special attention to the area where your surgery will be performed.  7. Thoroughly rinse your body with warm water from the neck down.  8. DO NOT shower/wash with your normal soap after using and rinsing off the CHG Soap.  9. Pat yourself dry with a CLEAN TOWEL.  10. Wear CLEAN PAJAMAS to bed the night before surgery, wear comfortable clothes the morning of surgery  11. Place CLEAN SHEETS on your bed the night of your first shower and DO NOT SLEEP WITH PETS.    Day of Surgery:   Please shower the morning of surgery with the CHG soap  Do not apply any deodorants/lotions. Please wear clean clothes to the hospital/surgery center.   Remember to brush your teeth WITH YOUR REGULAR TOOTHPASTE.   Please read over the following fact sheets that you were given.

## 2019-04-14 NOTE — Progress Notes (Signed)
93 68-year-old white male history of C3-4 and C4-5 HNP/stenosis, neck pain and left shoulder pain comes in for preop evaluation.  States that symptoms unchanged from previous visit.  He is wanting to proceed with C3-4 and C4-5 ACDF as scheduled.  We received preop medical and cardiac clearance.  Surgical procedure along with potential hospital stay discussed.  Cardiologist would like patient to be admitted to telemetry unit postop.  All questions answered.

## 2019-04-15 ENCOUNTER — Other Ambulatory Visit (HOSPITAL_COMMUNITY)
Admission: RE | Admit: 2019-04-15 | Discharge: 2019-04-15 | Disposition: A | Discharge status: Home / Self Care | Payer: Medicare HMO | Source: Ambulatory Visit | Attending: Specialist | Admitting: Specialist

## 2019-04-15 ENCOUNTER — Other Ambulatory Visit: Payer: Self-pay

## 2019-04-15 ENCOUNTER — Ambulatory Visit (HOSPITAL_COMMUNITY)
Admission: RE | Admit: 2019-04-15 | Discharge: 2019-04-15 | Disposition: A | Discharge status: Home / Self Care | Payer: Medicare HMO | Source: Ambulatory Visit | Attending: Surgery | Admitting: Surgery

## 2019-04-15 ENCOUNTER — Encounter (HOSPITAL_COMMUNITY): Payer: Self-pay

## 2019-04-15 ENCOUNTER — Encounter (HOSPITAL_COMMUNITY)
Admission: RE | Admit: 2019-04-15 | Discharge: 2019-04-15 | Disposition: A | Discharge status: Home / Self Care | Payer: Medicare HMO | Source: Ambulatory Visit | Attending: Specialist | Admitting: Specialist

## 2019-04-15 DIAGNOSIS — Z7982 Long term (current) use of aspirin: Secondary | ICD-10-CM | POA: Diagnosis not present

## 2019-04-15 DIAGNOSIS — K219 Gastro-esophageal reflux disease without esophagitis: Secondary | ICD-10-CM | POA: Diagnosis not present

## 2019-04-15 DIAGNOSIS — R001 Bradycardia, unspecified: Secondary | ICD-10-CM | POA: Diagnosis not present

## 2019-04-15 DIAGNOSIS — Z01818 Encounter for other preprocedural examination: Secondary | ICD-10-CM | POA: Insufficient documentation

## 2019-04-15 DIAGNOSIS — M5021 Other cervical disc displacement,  high cervical region: Secondary | ICD-10-CM | POA: Diagnosis not present

## 2019-04-15 DIAGNOSIS — F319 Bipolar disorder, unspecified: Secondary | ICD-10-CM | POA: Insufficient documentation

## 2019-04-15 DIAGNOSIS — G4733 Obstructive sleep apnea (adult) (pediatric): Secondary | ICD-10-CM | POA: Diagnosis not present

## 2019-04-15 DIAGNOSIS — Z79899 Other long term (current) drug therapy: Secondary | ICD-10-CM | POA: Diagnosis not present

## 2019-04-15 DIAGNOSIS — I10 Essential (primary) hypertension: Secondary | ICD-10-CM | POA: Insufficient documentation

## 2019-04-15 DIAGNOSIS — Z87891 Personal history of nicotine dependence: Secondary | ICD-10-CM | POA: Insufficient documentation

## 2019-04-15 DIAGNOSIS — N400 Enlarged prostate without lower urinary tract symptoms: Secondary | ICD-10-CM | POA: Diagnosis not present

## 2019-04-15 DIAGNOSIS — R69 Illness, unspecified: Secondary | ICD-10-CM | POA: Diagnosis not present

## 2019-04-15 DIAGNOSIS — Z20828 Contact with and (suspected) exposure to other viral communicable diseases: Secondary | ICD-10-CM | POA: Diagnosis not present

## 2019-04-15 DIAGNOSIS — R002 Palpitations: Secondary | ICD-10-CM | POA: Diagnosis not present

## 2019-04-15 HISTORY — DX: Gastro-esophageal reflux disease without esophagitis: K21.9

## 2019-04-15 HISTORY — DX: Sleep apnea, unspecified: G47.30

## 2019-04-15 HISTORY — DX: Bradycardia, unspecified: R00.1

## 2019-04-15 LAB — CBC
HCT: 42.6 % (ref 39.0–52.0)
Hemoglobin: 14.4 g/dL (ref 13.0–17.0)
MCH: 32.2 pg (ref 26.0–34.0)
MCHC: 33.8 g/dL (ref 30.0–36.0)
MCV: 95.3 fL (ref 80.0–100.0)
Platelets: 204 10*3/uL (ref 150–400)
RBC: 4.47 MIL/uL (ref 4.22–5.81)
RDW: 12.8 % (ref 11.5–15.5)
WBC: 7.6 10*3/uL (ref 4.0–10.5)
nRBC: 0 % (ref 0.0–0.2)

## 2019-04-15 LAB — URINALYSIS, ROUTINE W REFLEX MICROSCOPIC
Bilirubin Urine: NEGATIVE
Glucose, UA: NEGATIVE mg/dL
Hgb urine dipstick: NEGATIVE
Ketones, ur: NEGATIVE mg/dL
Leukocytes,Ua: NEGATIVE
Nitrite: NEGATIVE
Protein, ur: NEGATIVE mg/dL
Specific Gravity, Urine: 1.01 (ref 1.005–1.030)
pH: 6 (ref 5.0–8.0)

## 2019-04-15 LAB — COMPREHENSIVE METABOLIC PANEL
ALT: 28 U/L (ref 0–44)
AST: 24 U/L (ref 15–41)
Albumin: 4.1 g/dL (ref 3.5–5.0)
Alkaline Phosphatase: 86 U/L (ref 38–126)
Anion gap: 8 (ref 5–15)
BUN: 10 mg/dL (ref 8–23)
CO2: 23 mmol/L (ref 22–32)
Calcium: 10.4 mg/dL — ABNORMAL HIGH (ref 8.9–10.3)
Chloride: 107 mmol/L (ref 98–111)
Creatinine, Ser: 0.87 mg/dL (ref 0.61–1.24)
GFR calc Af Amer: >60 mL/min (ref >60)
GFR calc non Af Amer: >60 mL/min (ref >60)
Glucose, Bld: 101 mg/dL — ABNORMAL HIGH (ref 70–99)
Potassium: 3.7 mmol/L (ref 3.5–5.1)
Sodium: 138 mmol/L (ref 135–145)
Total Bilirubin: 1.5 mg/dL — ABNORMAL HIGH (ref 0.3–1.2)
Total Protein: 6.9 g/dL (ref 6.5–8.1)

## 2019-04-15 LAB — SURGICAL PCR SCREEN
MRSA, PCR: NEGATIVE
Staphylococcus aureus: POSITIVE — AB

## 2019-04-15 LAB — APTT: aPTT: 28 seconds (ref 24–36)

## 2019-04-15 LAB — PROTIME-INR
INR: 1 (ref 0.8–1.2)
Prothrombin Time: 12.7 seconds (ref 11.4–15.2)

## 2019-04-15 NOTE — Progress Notes (Signed)
PCP - Iran Planas, PA-C Cardiologist - Shirlee More, MD  Chest x-ray - 04/15/2019 EKG - 04/06/2019 Stress Test - denies ECHO - denies Cardiac Cath - denies  Sleep Study - 2016 CPAP - Patient instructed to bring his own CPAP, mask, tubing and machine on DOS; settings 9  Blood Thinner Instructions: N/A Aspirin Instructions: instructed by MD to stop 5 days prior; patient reports LD: 04/11/2019  Anesthesia review: yes; cardiac clearance 04/2019; post abnormal EKG  Patient denies shortness of breath, fever, cough and chest pain at PAT appointment  COVID testing scheduled for 04/15/2019; patient states verbal understanding of need for self-quarantine post testing.  Coronavirus Screening  Have you experienced the following symptoms:  Cough yes/no: No Fever (>100.50F)  yes/no: No Runny nose yes/no: No Sore throat yes/no: No Difficulty breathing/shortness of breath  yes/no: No  Have you or a family member traveled in the last 14 days and where? yes/no: No   If the patient indicates "YES" to the above questions, their PAT will be rescheduled to limit the exposure to others and, the surgeon will be notified. THE PATIENT WILL NEED TO BE ASYMPTOMATIC FOR 14 DAYS.   If the patient is not experiencing any of these symptoms, the PAT nurse will instruct them to NOT bring anyone with them to their appointment since they may have these symptoms or traveled as well.   Please remind your patients and families that hospital visitation restrictions are in effect and the importance of the restrictions.    Patient verbalized understanding of instructions that were given to them at the PAT appointment. Patient was also instructed that they will need to review over the PAT instructions again at home before surgery.

## 2019-04-16 LAB — NOVEL CORONAVIRUS, NAA (HOSP ORDER, SEND-OUT TO REF LAB; TAT 18-24 HRS): SARS-CoV-2, NAA: NOT DETECTED

## 2019-04-18 ENCOUNTER — Encounter (HOSPITAL_COMMUNITY): Payer: Self-pay

## 2019-04-18 NOTE — Anesthesia Preprocedure Evaluation (Addendum)
Anesthesia Evaluation  Patient identified by MRN, date of birth, ID band Patient awake    Airway Mallampati: III  TM Distance: >3 FB Neck ROM: Full    Dental no notable dental hx. (+) Poor Dentition, Dental Advisory Given,    Pulmonary sleep apnea and Continuous Positive Airway Pressure Ventilation , former smoker,  Reactive airway disease, no inhalers Quit smoking 1985   Pulmonary exam normal breath sounds clear to auscultation       Cardiovascular hypertension, Pt. on medications Normal cardiovascular exam+ dysrhythmias  Rhythm:Regular Rate:Bradycardia  baseline bradycardia in 40s, negative cardiac workup. walks 1.68miles/d, HR inc to 60s when exercising   Neuro/Psych PSYCHIATRIC DISORDERS Anxiety Bipolar Disorder RLS Cervical radiculopathy, spondylosis- limited ROM rotation to left, not limited in extension or flexion  Neuromuscular disease    GI/Hepatic Neg liver ROS, GERD  Medicated,Bowel perf s/p resection/ostomy/takedown Barretts esophagus   Endo/Other  negative endocrine ROS  Renal/GU negative Renal ROS   BPH    Musculoskeletal  (+) Arthritis , Osteoarthritis,    Abdominal Normal abdominal exam  (+)   Peds  Hematology   Anesthesia Other Findings Pain meds: gabapentin, lortab  Reproductive/Obstetrics negative OB ROS                           Anesthesia Physical Anesthesia Plan  ASA: II  Anesthesia Plan: General   Post-op Pain Management:    Induction: Intravenous  PONV Risk Score and Plan: 3 and Ondansetron, Dexamethasone, Treatment may vary due to age or medical condition and Midazolam  Airway Management Planned: Oral ETT  Additional Equipment: None  Intra-op Plan:   Post-operative Plan: Extubation in OR  Informed Consent: I have reviewed the patients History and Physical, chart, labs and discussed the procedure including the risks, benefits and alternatives for the  proposed anesthesia with the patient or authorized representative who has indicated his/her understanding and acceptance.     Dental advisory given  Plan Discussed with: CRNA  Anesthesia Plan Comments: (Per cardiology, no rate lowering agents inc precedex. Also will need 24h of postop telemetry.)       Anesthesia Quick Evaluation

## 2019-04-18 NOTE — Progress Notes (Signed)
Anesthesia Chart Review:  Case: [id number] Date/Time: 04/19/19 0715   Procedure: ANTERIOR CERVICAL DISCECTOMY FUSION C3-4, C4-5 (N/A )   Anesthesia type: General   Pre-op diagnosis: herniated nucleus pulposus C3-4, C4-5   Location: [facility] OR ROOM 05 / [place] [facility]   Surgeon: Jessy Oto, MD      DISCUSSION: Patient is a 68 year old male scheduled for the above procedure.  History includes OSA (uses CAPA), former smoker (quit 1985), Bipolar 1 disorder, HTN, BPH, GERD, diverticulitis (~2007)/bowel perforation (s/p colon resection, colostomy; s/p colostomy takedown), bradycardia.   PCP Donella Stade, PA-C signed a letter of medical clearance on 04/04/19, but recommended cardiology clearance as well given baseline bradycardia (40's). Patient was referred to cardiologist Dr. Bettina Gavia for preoperative evaluation and seen on 04/06/19. Note states, that patient "walks 1-1/2 miles every day...no exercise intolerance shortness of breath palpitations syncope or chest pain.  He takes no rate slowing medications and has no history of heart disease ischemic congenital rheumatic or heart failure." HR accelerated to 62 BPM walking in the office. Per Dr. Bettina Gavia, "His planned surgery is elective and its intermediate risk from a cardiology perspective.  His racked risk factors include hypertension well-controlled and asymptomatic sinus bradycardia.  His exercise tolerance is in the range of 6 to 7 months and at this time no think he needs further preoperative cardiac screening such as ischemia evaluation or echocardiogram.  I did ask him to apply a ZIO monitor for 48 hours on review of prior to surgery and alert anesthesia to avoid rate slowing medications and he may require intraoperative atropine or Isuprel I do not think there is any role for permanent or temporary pacemaker.  Postoperatively placed on a monitored bed for the first 24 hours EKG postoperative day 1 and 8 questions contact [facility]."  Patient has not had  the Zio monitor yet, but has been asymptmoatic of his bradycardia. Reviewed clearance note with anesthesiologist Oren Bracket, MD who agrees that if no acute changes then anticipate he can proceed as planned. Reported last ASA 04/11/19. 04/15/19 COVID-19 test negative.   VS: BP 103/84   Pulse 65   Temp 37.1 C (Temporal)   Resp 18   Ht 5' 10.5" (1.791 m)   Wt 92.1 kg   SpO2 100%   BMI 28.72 kg/m   PROVIDERS: Donella Stade, PA-C is PCP  Shirlee More, MD is cardiologist   LABS: Labs reviewed: Acceptable for surgery. Total bilirubin 1.5, but previously 1.1-1.3 01/20/19-04/04/19. AST/ALT WNL.  (all labs ordered are listed, but only abnormal results are displayed)  Labs Reviewed  SURGICAL PCR SCREEN - Abnormal; Notable for the following components:      Result Value   Staphylococcus aureus POSITIVE (*)    All other components within normal limits  COMPREHENSIVE METABOLIC PANEL - Abnormal; Notable for the following components:   Glucose, Bld 101 (*)    Calcium 10.4 (*)    Total Bilirubin 1.5 (*)    All other components within normal limits  APTT  CBC  PROTIME-INR  URINALYSIS, ROUTINE W REFLEX MICROSCOPIC    Spirometry 09/07/17: FVC 3.91 (87%), FEV1 3.12 (94%). FEV1/FVC 79.7 (107%). Normal Spirometry.   IMAGES: CXR 04/15/19: FINDINGS: The heart size and mediastinal contours are within normal limits. Both lungs are clear. Disc degenerative disease of the thoracic spine. IMPRESSION: No acute abnormality of the lungs.   EKG: 04/06/19: Marked sinus bradycardia at 44 bpm.   CV: He is scheduled for future  3-14 day cardiac monitor.  Past Medical History:  Diagnosis Date  . Barrett's esophagus 09/30/2013  . Bipolar 1 disorder, mixed ([place]) 09/30/2013   [facility], Dr. Ardine Eng   . BPH (benign prostatic hyperplasia) 09/30/2013  . Diverticulitis 2007  . Essential hypertension, benign 09/30/2013  . GERD (gastroesophageal reflux disease)   . Sleep apnea     Past Surgical  History:  Procedure Laterality Date  . COLOSTOMY REVERSAL    . large bowel perforation      MEDICATIONS: . AMBULATORY NON FORMULARY MEDICATION  . amLODipine (NORVASC) 10 MG tablet  . aspirin 81 MG tablet  . atorvastatin (LIPITOR) 20 MG tablet  . azelastine (ASTELIN) 0.1 % nasal spray  . benazepril (LOTENSIN) 20 MG tablet  . clonazePAM (KLONOPIN) 0.5 MG tablet  . cyclobenzaprine (FLEXERIL) 10 MG tablet  . doxazosin (CARDURA) 4 MG tablet  . gabapentin (NEURONTIN) 600 MG tablet  . HYDROcodone-acetaminophen (NORCO/VICODIN) 5-325 MG tablet  . hyoscyamine (LEVBID) 0.375 MG 12 hr tablet  . lithium carbonate (ESKALITH) 450 MG CR tablet  . Omega-3 Fatty Acids (FISH OIL) 1000 MG CAPS  . pantoprazole (PROTONIX) 40 MG tablet  . promethazine (PHENERGAN) 25 MG tablet  . rOPINIRole (REQUIP) 0.25 MG tablet  . sertraline (ZOLOFT) 100 MG tablet  . sucralfate (CARAFATE) 1 g tablet   No current facility-administered medications for this encounter.     Myra Gianotti, PA-C Surgical Short Stay/Anesthesiology [facility] Phone [phone] [facility] Phone [phone] 04/18/2019 2:17 PM

## 2019-04-19 ENCOUNTER — Other Ambulatory Visit: Payer: Self-pay

## 2019-04-19 ENCOUNTER — Ambulatory Visit (HOSPITAL_COMMUNITY): Payer: Medicare HMO

## 2019-04-19 ENCOUNTER — Observation Stay (HOSPITAL_COMMUNITY)
Admission: RE | Admit: 2019-04-19 | Discharge: 2019-04-20 | Disposition: A | Discharge status: Home / Self Care | Payer: Medicare HMO | Attending: Specialist | Admitting: Specialist

## 2019-04-19 ENCOUNTER — Ambulatory Visit (HOSPITAL_COMMUNITY): Payer: Medicare HMO | Admitting: Vascular Surgery

## 2019-04-19 ENCOUNTER — Encounter (HOSPITAL_COMMUNITY)
Admission: RE | Disposition: A | Discharge status: Home / Self Care | Payer: Self-pay | Source: Home / Self Care | Attending: Specialist

## 2019-04-19 ENCOUNTER — Encounter (HOSPITAL_COMMUNITY): Payer: Self-pay

## 2019-04-19 DIAGNOSIS — R69 Illness, unspecified: Secondary | ICD-10-CM | POA: Diagnosis not present

## 2019-04-19 DIAGNOSIS — M4322 Fusion of spine, cervical region: Secondary | ICD-10-CM | POA: Diagnosis present

## 2019-04-19 DIAGNOSIS — M1990 Unspecified osteoarthritis, unspecified site: Secondary | ICD-10-CM | POA: Diagnosis not present

## 2019-04-19 DIAGNOSIS — G4730 Sleep apnea, unspecified: Secondary | ICD-10-CM | POA: Diagnosis not present

## 2019-04-19 DIAGNOSIS — M5010 Cervical disc disorder with radiculopathy, unspecified cervical region: Secondary | ICD-10-CM | POA: Diagnosis not present

## 2019-04-19 DIAGNOSIS — M4722 Other spondylosis with radiculopathy, cervical region: Secondary | ICD-10-CM | POA: Diagnosis present

## 2019-04-19 DIAGNOSIS — M4802 Spinal stenosis, cervical region: Secondary | ICD-10-CM | POA: Insufficient documentation

## 2019-04-19 DIAGNOSIS — F319 Bipolar disorder, unspecified: Secondary | ICD-10-CM | POA: Insufficient documentation

## 2019-04-19 DIAGNOSIS — Z419 Encounter for procedure for purposes other than remedying health state, unspecified: Secondary | ICD-10-CM

## 2019-04-19 DIAGNOSIS — Z87891 Personal history of nicotine dependence: Secondary | ICD-10-CM | POA: Insufficient documentation

## 2019-04-19 DIAGNOSIS — I10 Essential (primary) hypertension: Secondary | ICD-10-CM | POA: Diagnosis not present

## 2019-04-19 DIAGNOSIS — Z79899 Other long term (current) drug therapy: Secondary | ICD-10-CM | POA: Diagnosis not present

## 2019-04-19 DIAGNOSIS — Z7982 Long term (current) use of aspirin: Secondary | ICD-10-CM | POA: Diagnosis not present

## 2019-04-19 DIAGNOSIS — M5021 Other cervical disc displacement,  high cervical region: Secondary | ICD-10-CM | POA: Diagnosis present

## 2019-04-19 DIAGNOSIS — K219 Gastro-esophageal reflux disease without esophagitis: Secondary | ICD-10-CM | POA: Insufficient documentation

## 2019-04-19 DIAGNOSIS — E785 Hyperlipidemia, unspecified: Secondary | ICD-10-CM | POA: Diagnosis not present

## 2019-04-19 DIAGNOSIS — M50221 Other cervical disc displacement at C4-C5 level: Secondary | ICD-10-CM | POA: Diagnosis not present

## 2019-04-19 HISTORY — DX: Cervical disc disorder with radiculopathy, unspecified cervical region: M50.10

## 2019-04-19 HISTORY — DX: Fusion of spine, cervical region: M43.22

## 2019-04-19 HISTORY — PX: ANTERIOR CERVICAL DECOMP/DISCECTOMY FUSION: SHX1161

## 2019-04-19 SURGERY — ANTERIOR CERVICAL DECOMPRESSION/DISCECTOMY FUSION 2 LEVELS
Anesthesia: General | Site: Spine Cervical

## 2019-04-19 MED ORDER — ONDANSETRON HCL 4 MG/2ML IJ SOLN
INTRAMUSCULAR | Status: AC
  Filled 2019-04-19: qty 2

## 2019-04-19 MED ORDER — BUPIVACAINE HCL 0.5 % IJ SOLN
INTRAMUSCULAR | Status: DC | PRN
  Administered 2019-04-19: 20 mL

## 2019-04-19 MED ORDER — ROPINIROLE HCL 0.25 MG PO TABS
0.2500 mg | ORAL_TABLET | Freq: Every day | ORAL | Status: DC
  Administered 2019-04-19: 0.25 mg via ORAL
  Filled 2019-04-19 (×2): qty 1

## 2019-04-19 MED ORDER — CYCLOBENZAPRINE HCL 10 MG PO TABS: 10.0000 mg | ORAL_TABLET | Freq: Three times a day (TID) | ORAL | Status: DC | PRN

## 2019-04-19 MED ORDER — OXYCODONE HCL 5 MG PO TABS: 5.0000 mg | ORAL_TABLET | Freq: Once | ORAL | Status: DC | PRN

## 2019-04-19 MED ORDER — CEFAZOLIN SODIUM-DEXTROSE 2-4 GM/100ML-% IV SOLN
2.0000 g | Freq: Three times a day (TID) | INTRAVENOUS | Status: AC
  Administered 2019-04-19 (×2): 2 g via INTRAVENOUS
  Filled 2019-04-19 (×2): qty 100

## 2019-04-19 MED ORDER — BISACODYL 5 MG PO TBEC: 5.0000 mg | DELAYED_RELEASE_TABLET | Freq: Every day | ORAL | Status: DC | PRN

## 2019-04-19 MED ORDER — DOCUSATE SODIUM 100 MG PO CAPS
100.0000 mg | ORAL_CAPSULE | Freq: Two times a day (BID) | ORAL | Status: DC
  Administered 2019-04-19 (×2): 100 mg via ORAL
  Filled 2019-04-19 (×2): qty 1

## 2019-04-19 MED ORDER — ONDANSETRON HCL 4 MG/2ML IJ SOLN: 4.0000 mg | Freq: Four times a day (QID) | INTRAMUSCULAR | Status: DC | PRN

## 2019-04-19 MED ORDER — LIDOCAINE 2% (20 MG/ML) 5 ML SYRINGE
INTRAMUSCULAR | Status: AC
  Filled 2019-04-19: qty 5

## 2019-04-19 MED ORDER — SODIUM CHLORIDE 0.9% FLUSH: 3.0000 mL | INTRAVENOUS | Status: DC | PRN

## 2019-04-19 MED ORDER — HYDROCODONE-ACETAMINOPHEN 7.5-325 MG PO TABS
1.0000 | ORAL_TABLET | ORAL | Status: DC | PRN
  Administered 2019-04-19: 1 via ORAL
  Filled 2019-04-19: qty 1

## 2019-04-19 MED ORDER — LIDOCAINE 2% (20 MG/ML) 5 ML SYRINGE
INTRAMUSCULAR | Status: DC | PRN
  Administered 2019-04-19: 80 mg via INTRAVENOUS

## 2019-04-19 MED ORDER — HYDROCODONE-ACETAMINOPHEN 7.5-325 MG PO TABS
1.0000 | ORAL_TABLET | Freq: Four times a day (QID) | ORAL | Status: DC
  Administered 2019-04-19 – 2019-04-20 (×3): 1 via ORAL
  Filled 2019-04-19 (×3): qty 1

## 2019-04-19 MED ORDER — EPHEDRINE SULFATE-NACL 50-0.9 MG/10ML-% IV SOSY
PREFILLED_SYRINGE | INTRAVENOUS | Status: DC | PRN
  Administered 2019-04-19 (×3): 10 mg via INTRAVENOUS

## 2019-04-19 MED ORDER — HEMOSTATIC AGENTS (NO CHARGE) OPTIME
TOPICAL | Status: DC | PRN
  Administered 2019-04-19 (×2): 1

## 2019-04-19 MED ORDER — CLONAZEPAM 0.5 MG PO TABS: 0.5000 mg | ORAL_TABLET | Freq: Two times a day (BID) | ORAL | Status: DC | PRN

## 2019-04-19 MED ORDER — PANTOPRAZOLE SODIUM 40 MG PO TBEC
40.0000 mg | DELAYED_RELEASE_TABLET | Freq: Every day | ORAL | Status: DC
  Administered 2019-04-19: 40 mg via ORAL
  Filled 2019-04-19: qty 1

## 2019-04-19 MED ORDER — SODIUM CHLORIDE 0.9 % IV SOLN: 250.0000 mL | INTRAVENOUS | Status: DC

## 2019-04-19 MED ORDER — POLYETHYLENE GLYCOL 3350 17 G PO PACK: 17.0000 g | PACK | Freq: Every day | ORAL | Status: DC | PRN

## 2019-04-19 MED ORDER — 0.9 % SODIUM CHLORIDE (POUR BTL) OPTIME
TOPICAL | Status: DC | PRN
  Administered 2019-04-19 (×2): 1000 mL

## 2019-04-19 MED ORDER — KETOROLAC TROMETHAMINE 30 MG/ML IJ SOLN: 30.0000 mg | Freq: Once | INTRAMUSCULAR | Status: DC | PRN

## 2019-04-19 MED ORDER — BUPIVACAINE LIPOSOME 1.3 % IJ SUSP
INTRAMUSCULAR | Status: DC | PRN
  Administered 2019-04-19: 20 mL

## 2019-04-19 MED ORDER — MENTHOL 3 MG MT LOZG
1.0000 | LOZENGE | OROMUCOSAL | Status: DC | PRN
  Administered 2019-04-19: 3 mg via ORAL
  Filled 2019-04-19: qty 9

## 2019-04-19 MED ORDER — ASPIRIN 81 MG PO CHEW
81.0000 mg | CHEWABLE_TABLET | ORAL | Status: DC
  Administered 2019-04-19: 81 mg via ORAL
  Filled 2019-04-19: qty 1

## 2019-04-19 MED ORDER — PANTOPRAZOLE SODIUM 40 MG IV SOLR: 40.0000 mg | Freq: Every day | INTRAVENOUS | Status: DC

## 2019-04-19 MED ORDER — CEFAZOLIN SODIUM-DEXTROSE 2-4 GM/100ML-% IV SOLN
2.0000 g | INTRAVENOUS | Status: AC
  Administered 2019-04-19: 2 g via INTRAVENOUS
  Filled 2019-04-19: qty 100

## 2019-04-19 MED ORDER — GABAPENTIN 300 MG PO CAPS: 300.0000 mg | ORAL_CAPSULE | Freq: Three times a day (TID) | ORAL | Status: DC

## 2019-04-19 MED ORDER — PROMETHAZINE HCL 25 MG/ML IJ SOLN
6.2500 mg | INTRAMUSCULAR | Status: DC | PRN
  Administered 2019-04-19: 12.5 mg via INTRAVENOUS

## 2019-04-19 MED ORDER — GABAPENTIN 600 MG PO TABS
600.0000 mg | ORAL_TABLET | Freq: Two times a day (BID) | ORAL | Status: DC
  Administered 2019-04-19 (×2): 600 mg via ORAL
  Filled 2019-04-19 (×2): qty 1

## 2019-04-19 MED ORDER — BUPIVACAINE HCL (PF) 0.5 % IJ SOLN
INTRAMUSCULAR | Status: AC
  Filled 2019-04-19: qty 30

## 2019-04-19 MED ORDER — FENTANYL CITRATE (PF) 100 MCG/2ML IJ SOLN
INTRAMUSCULAR | Status: DC | PRN
  Administered 2019-04-19: 50 ug via INTRAVENOUS
  Administered 2019-04-19: 100 ug via INTRAVENOUS
  Administered 2019-04-19 (×2): 50 ug via INTRAVENOUS

## 2019-04-19 MED ORDER — DEXAMETHASONE SODIUM PHOSPHATE 10 MG/ML IJ SOLN
INTRAMUSCULAR | Status: AC
  Filled 2019-04-19: qty 1

## 2019-04-19 MED ORDER — ACETAMINOPHEN 500 MG PO TABS
1000.0000 mg | ORAL_TABLET | Freq: Once | ORAL | Status: AC
  Administered 2019-04-19: 1000 mg via ORAL
  Filled 2019-04-19: qty 2

## 2019-04-19 MED ORDER — PROMETHAZINE HCL 25 MG/ML IJ SOLN
INTRAMUSCULAR | Status: AC
  Filled 2019-04-19: qty 1

## 2019-04-19 MED ORDER — ACETAMINOPHEN 650 MG RE SUPP: 650.0000 mg | RECTAL | Status: DC | PRN

## 2019-04-19 MED ORDER — PROPOFOL 10 MG/ML IV BOLUS
INTRAVENOUS | Status: AC
  Filled 2019-04-19: qty 20

## 2019-04-19 MED ORDER — LACTATED RINGERS IV SOLN
INTRAVENOUS | Status: DC | PRN
  Administered 2019-04-19 (×2): via INTRAVENOUS

## 2019-04-19 MED ORDER — SUGAMMADEX SODIUM 200 MG/2ML IV SOLN
INTRAVENOUS | Status: DC | PRN
  Administered 2019-04-19: 190 mg via INTRAVENOUS

## 2019-04-19 MED ORDER — SODIUM CHLORIDE 0.9 % IV SOLN
INTRAVENOUS | Status: DC
  Administered 2019-04-19: 16:00:00 via INTRAVENOUS

## 2019-04-19 MED ORDER — FENTANYL CITRATE (PF) 250 MCG/5ML IJ SOLN
INTRAMUSCULAR | Status: AC
  Filled 2019-04-19: qty 5

## 2019-04-19 MED ORDER — CHLORHEXIDINE GLUCONATE 4 % EX LIQD: 60.0000 mL | Freq: Once | CUTANEOUS | Status: DC

## 2019-04-19 MED ORDER — BUPIVACAINE LIPOSOME 1.3 % IJ SUSP
20.0000 mL | Freq: Once | INTRAMUSCULAR | Status: DC
  Filled 2019-04-19: qty 20

## 2019-04-19 MED ORDER — PROPOFOL 10 MG/ML IV BOLUS
INTRAVENOUS | Status: DC | PRN
  Administered 2019-04-19: 150 mg via INTRAVENOUS

## 2019-04-19 MED ORDER — GLYCOPYRROLATE 0.2 MG/ML IJ SOLN
INTRAMUSCULAR | Status: DC | PRN
  Administered 2019-04-19: 0.2 mg via INTRAVENOUS

## 2019-04-19 MED ORDER — AMLODIPINE BESYLATE 10 MG PO TABS
10.0000 mg | ORAL_TABLET | Freq: Every day | ORAL | Status: DC
  Administered 2019-04-19: 10 mg via ORAL
  Filled 2019-04-19 (×2): qty 1

## 2019-04-19 MED ORDER — DOXAZOSIN MESYLATE 4 MG PO TABS
4.0000 mg | ORAL_TABLET | Freq: Two times a day (BID) | ORAL | Status: DC
  Administered 2019-04-19 (×2): 4 mg via ORAL
  Filled 2019-04-19 (×4): qty 1

## 2019-04-19 MED ORDER — DEXAMETHASONE SODIUM PHOSPHATE 10 MG/ML IJ SOLN
INTRAMUSCULAR | Status: DC | PRN
  Administered 2019-04-19: 10 mg via INTRAVENOUS

## 2019-04-19 MED ORDER — SODIUM CHLORIDE 0.9 % IV SOLN
INTRAVENOUS | Status: DC | PRN
  Administered 2019-04-19: 15 ug/min via INTRAVENOUS

## 2019-04-19 MED ORDER — SODIUM CHLORIDE 0.9% FLUSH
3.0000 mL | Freq: Two times a day (BID) | INTRAVENOUS | Status: DC
  Administered 2019-04-19 (×2): 3 mL via INTRAVENOUS

## 2019-04-19 MED ORDER — ROCURONIUM BROMIDE 10 MG/ML (PF) SYRINGE
PREFILLED_SYRINGE | INTRAVENOUS | Status: AC
  Filled 2019-04-19: qty 10

## 2019-04-19 MED ORDER — ONDANSETRON HCL 4 MG PO TABS: 4.0000 mg | ORAL_TABLET | Freq: Four times a day (QID) | ORAL | Status: DC | PRN

## 2019-04-19 MED ORDER — PHENOL 1.4 % MT LIQD
1.0000 | OROMUCOSAL | Status: DC | PRN
  Administered 2019-04-19: 1 via OROMUCOSAL
  Filled 2019-04-19: qty 177

## 2019-04-19 MED ORDER — ONDANSETRON HCL 4 MG/2ML IJ SOLN
INTRAMUSCULAR | Status: DC | PRN
  Administered 2019-04-19: 4 mg via INTRAVENOUS

## 2019-04-19 MED ORDER — FLEET ENEMA 7-19 GM/118ML RE ENEM: 1.0000 | ENEMA | Freq: Once | RECTAL | Status: DC | PRN

## 2019-04-19 MED ORDER — ROCURONIUM BROMIDE 50 MG/5ML IV SOSY
PREFILLED_SYRINGE | INTRAVENOUS | Status: DC | PRN
  Administered 2019-04-19: 20 mg via INTRAVENOUS
  Administered 2019-04-19 (×3): 50 mg via INTRAVENOUS

## 2019-04-19 MED ORDER — SERTRALINE HCL 100 MG PO TABS
100.0000 mg | ORAL_TABLET | Freq: Every day | ORAL | Status: DC
  Filled 2019-04-19: qty 1

## 2019-04-19 MED ORDER — MIDAZOLAM HCL 2 MG/2ML IJ SOLN
INTRAMUSCULAR | Status: AC
  Filled 2019-04-19: qty 2

## 2019-04-19 MED ORDER — BENAZEPRIL HCL 20 MG PO TABS
20.0000 mg | ORAL_TABLET | Freq: Every day | ORAL | Status: DC
  Administered 2019-04-19: 20 mg via ORAL
  Filled 2019-04-19 (×2): qty 1

## 2019-04-19 MED ORDER — ATORVASTATIN CALCIUM 10 MG PO TABS
20.0000 mg | ORAL_TABLET | Freq: Every day | ORAL | Status: DC
  Administered 2019-04-19: 20 mg via ORAL

## 2019-04-19 MED ORDER — OXYCODONE HCL 5 MG/5ML PO SOLN: 5.0000 mg | Freq: Once | ORAL | Status: DC | PRN

## 2019-04-19 MED ORDER — HYDROMORPHONE HCL 1 MG/ML IJ SOLN: 0.2500 mg | INTRAMUSCULAR | Status: DC | PRN

## 2019-04-19 MED ORDER — METHOCARBAMOL 1000 MG/10ML IJ SOLN
500.0000 mg | Freq: Four times a day (QID) | INTRAVENOUS | Status: DC | PRN
  Filled 2019-04-19: qty 5

## 2019-04-19 MED ORDER — ACETAMINOPHEN 325 MG PO TABS: 650.0000 mg | ORAL_TABLET | ORAL | Status: DC | PRN

## 2019-04-19 MED ORDER — MORPHINE SULFATE (PF) 2 MG/ML IV SOLN: 1.0000 mg | INTRAVENOUS | Status: DC | PRN

## 2019-04-19 MED ORDER — AZELASTINE HCL 0.1 % NA SOLN
2.0000 | Freq: Two times a day (BID) | NASAL | Status: DC
  Administered 2019-04-19: 2 via NASAL
  Filled 2019-04-19: qty 30

## 2019-04-19 MED ORDER — THROMBIN 20000 UNITS EX SOLR
CUTANEOUS | Status: DC | PRN
  Administered 2019-04-19: 20 mL

## 2019-04-19 MED ORDER — THROMBIN 20000 UNITS EX SOLR
CUTANEOUS | Status: AC
  Filled 2019-04-19: qty 20000

## 2019-04-19 MED ORDER — METHOCARBAMOL 500 MG PO TABS
500.0000 mg | ORAL_TABLET | Freq: Four times a day (QID) | ORAL | Status: DC | PRN
  Administered 2019-04-19: 500 mg via ORAL
  Filled 2019-04-19 (×2): qty 1

## 2019-04-19 MED ORDER — ALUM & MAG HYDROXIDE-SIMETH 200-200-20 MG/5ML PO SUSP: 30.0000 mL | Freq: Four times a day (QID) | ORAL | Status: DC | PRN

## 2019-04-19 MED ORDER — MIDAZOLAM HCL 5 MG/5ML IJ SOLN
INTRAMUSCULAR | Status: DC | PRN
  Administered 2019-04-19: 2 mg via INTRAVENOUS

## 2019-04-19 MED ORDER — HYDROCODONE-ACETAMINOPHEN 7.5-325 MG PO TABS: 2.0000 | ORAL_TABLET | ORAL | Status: DC | PRN

## 2019-04-19 MED ORDER — LITHIUM CARBONATE ER 450 MG PO TBCR
450.0000 mg | EXTENDED_RELEASE_TABLET | Freq: Two times a day (BID) | ORAL | Status: DC
  Administered 2019-04-19 (×2): 450 mg via ORAL
  Filled 2019-04-19 (×4): qty 1

## 2019-04-19 MED ORDER — GABAPENTIN 300 MG PO CAPS
300.0000 mg | ORAL_CAPSULE | Freq: Once | ORAL | Status: AC
  Administered 2019-04-19: 300 mg via ORAL
  Filled 2019-04-19: qty 1

## 2019-04-19 SURGICAL SUPPLY — 77 items
BENZOIN TINCTURE PRP APPL 2/3 (GAUZE/BANDAGES/DRESSINGS) ×2 IMPLANT
BIT DRILL SRG 14X2.2XFLT CHK (BIT) IMPLANT
BIT DRL SRG 14X2.2XFLT CHK (BIT) ×1
BLADE AVERAGE 25X9 (BLADE) IMPLANT
BLADE CLIPPER SURG (BLADE) IMPLANT
BONE VIVIGEN FORMABLE 5.4CC (Bone Implant) ×2 IMPLANT
BUR MATCHSTICK NEURO 3.0 LAGG (BURR) IMPLANT
BUR RND FLUTED 2.5 (BURR) ×1 IMPLANT
BUR SABER RD CUTTING 3.0 (BURR) IMPLANT
CLSR STERI-STRIP ANTIMIC 1/2X4 (GAUZE/BANDAGES/DRESSINGS) ×1 IMPLANT
COVER SURGICAL LIGHT HANDLE (MISCELLANEOUS) ×2 IMPLANT
COVER WAND RF STERILE (DRAPES) ×2 IMPLANT
DERMABOND ADVANCED (GAUZE/BANDAGES/DRESSINGS) ×1
DERMABOND ADVANCED .7 DNX12 (GAUZE/BANDAGES/DRESSINGS) ×1 IMPLANT
DRAIN TLS ROUND 10FR (DRAIN) IMPLANT
DRAPE C-ARM 42X72 X-RAY (DRAPES) ×2 IMPLANT
DRAPE MICROSCOPE LEICA (MISCELLANEOUS) ×2 IMPLANT
DRAPE POUCH INSTRU U-SHP 10X18 (DRAPES) ×2 IMPLANT
DRAPE SURG 17X23 STRL (DRAPES) ×13 IMPLANT
DRILL BIT SKYLINE 14MM (BIT) ×1
DRSG MEPILEX BORDER 4X4 (GAUZE/BANDAGES/DRESSINGS) IMPLANT
DURAPREP 6ML APPLICATOR 50/CS (WOUND CARE) ×2 IMPLANT
ELECT BLADE 4.0 EZ CLEAN MEGAD (MISCELLANEOUS)
ELECT COATED BLADE 2.86 ST (ELECTRODE) ×2 IMPLANT
ELECT REM PT RETURN 9FT ADLT (ELECTROSURGICAL) ×2
ELECTRODE BLDE 4.0 EZ CLN MEGD (MISCELLANEOUS) IMPLANT
ELECTRODE REM PT RTRN 9FT ADLT (ELECTROSURGICAL) ×1 IMPLANT
GAUZE SPONGE 4X4 12PLY STRL LF (GAUZE/BANDAGES/DRESSINGS) ×1 IMPLANT
GLOVE BIOGEL PI IND STRL 7.0 (GLOVE) IMPLANT
GLOVE BIOGEL PI IND STRL 8 (GLOVE) ×1 IMPLANT
GLOVE BIOGEL PI INDICATOR 7.0 (GLOVE) ×1
GLOVE BIOGEL PI INDICATOR 8 (GLOVE) ×1
GLOVE ECLIPSE 9.0 STRL (GLOVE) ×2 IMPLANT
GLOVE ORTHO TXT STRL SZ7.5 (GLOVE) ×2 IMPLANT
GLOVE SURG 8.5 LATEX PF (GLOVE) ×4 IMPLANT
GOWN STRL REUS W/ TWL LRG LVL3 (GOWN DISPOSABLE) ×1 IMPLANT
GOWN STRL REUS W/TWL 2XL LVL3 (GOWN DISPOSABLE) ×4 IMPLANT
GOWN STRL REUS W/TWL LRG LVL3 (GOWN DISPOSABLE) ×3
GRAFT BNE MATRIX VG FRMBL MD 5 (Bone Implant) IMPLANT
HALTER HD/CHIN CERV TRACTION D (MISCELLANEOUS) ×2 IMPLANT
KIT BASIN OR (CUSTOM PROCEDURE TRAY) ×2 IMPLANT
KIT TURNOVER KIT B (KITS) ×2 IMPLANT
MANIFOLD NEPTUNE II (INSTRUMENTS) ×1 IMPLANT
NDL SPNL 20GX3.5 QUINCKE YW (NEEDLE) ×2 IMPLANT
NEEDLE SPNL 20GX3.5 QUINCKE YW (NEEDLE) ×4 IMPLANT
NS IRRIG 1000ML POUR BTL (IV SOLUTION) ×2 IMPLANT
PACK ORTHO CERVICAL (CUSTOM PROCEDURE TRAY) ×2 IMPLANT
PAD ARMBOARD 7.5X6 YLW CONV (MISCELLANEOUS) ×4 IMPLANT
PATTIES SURGICAL .5 X.5 (GAUZE/BANDAGES/DRESSINGS) IMPLANT
PIN DISTRACTION 14 (PIN) ×2 IMPLANT
PIN DISTRACTION 14MM (PIN) IMPLANT
PIN TEMP SKYLINE THREADED (PIN) ×1 IMPLANT
PLATE SKYLINE TWO LEVEL 32MM (Plate) ×1 IMPLANT
RESTRAINT LIMB HOLDER UNIV (RESTRAINTS) ×2 IMPLANT
SCREW VAR SELF TAP SKYLINE 14M (Screw) ×6 IMPLANT
SPACER ADV ACF 9MM (Bone Implant) ×1 IMPLANT
SPACER ADV ACF LORODTIC 8MM (Bone Implant) ×1 IMPLANT
SPONGE INTESTINAL PEANUT (DISPOSABLE) IMPLANT
SPONGE LAP 4X18 RFD (DISPOSABLE) IMPLANT
SPONGE SURGIFOAM ABS GEL 100 (HEMOSTASIS) IMPLANT
STRIP CLOSURE SKIN 1/2X4 (GAUZE/BANDAGES/DRESSINGS) ×2 IMPLANT
SURGIFLO W/THROMBIN 8M KIT (HEMOSTASIS) ×1 IMPLANT
SUT ETHILON 4 0 PS 2 18 (SUTURE) ×2 IMPLANT
SUT VIC AB 2-0 CT1 27 (SUTURE) ×1
SUT VIC AB 2-0 CT1 TAPERPNT 27 (SUTURE) ×1 IMPLANT
SUT VIC AB 3-0 X1 27 (SUTURE) ×2 IMPLANT
SUT VICRYL 4-0 PS2 18IN ABS (SUTURE) IMPLANT
SYR 20ML LL LF (SYRINGE) ×2 IMPLANT
SYR 30ML LL (SYRINGE) ×2 IMPLANT
SYR CONTROL 10ML LL (SYRINGE) ×2 IMPLANT
SYSTEM CHEST DRAIN TLS 7FR (DRAIN) ×1 IMPLANT
TAPE CLOTH SURG 4X10 WHT LF (GAUZE/BANDAGES/DRESSINGS) ×1 IMPLANT
TOWEL GREEN STERILE (TOWEL DISPOSABLE) ×2 IMPLANT
TOWEL GREEN STERILE FF (TOWEL DISPOSABLE) ×2 IMPLANT
TRAY FOLEY MTR SLVR 16FR STAT (SET/KITS/TRAYS/PACK) ×2 IMPLANT
TRAY FOLEY W/BAG SLVR 14FR (SET/KITS/TRAYS/PACK) ×2 IMPLANT
WATER STERILE IRR 1000ML POUR (IV SOLUTION) ×2 IMPLANT

## 2019-04-19 NOTE — Brief Op Note (Signed)
04/19/2019  11:34 AM  PATIENT:  [name]  68 y.o. male  PRE-OPERATIVE DIAGNOSIS:  herniated nucleus pulposus C3-4, C4-5  POST-OPERATIVE DIAGNOSIS:  herniated nucleus pulposus C3-4, C4-5  PROCEDURE:  Procedure(s): ANTERIOR CERVICAL DISCECTOMY FUSION cervical three to four, cervical four to five. (N/A)  SURGEON:  Surgeon(s) and Role:    * Jessy Oto, MD - Primary  PHYSICIAN ASSISTANT: Benjiman Core, PA-C  ANESTHESIA:   local and general, Dr. Doroteo Glassman.  EBL:  100 mL   BLOOD ADMINISTERED:none  DRAINS: Urinary Catheter (Foley)   LOCAL MEDICATIONS USED:  MARCAINE 0.5% 1:1 EXPAREL 1.3%Amount: 10 ml  SPECIMEN:  No Specimen  DISPOSITION OF SPECIMEN:  N/A  COUNTS:  YES  TOURNIQUET:  * No tourniquets in log *  DICTATION: .Dragon Dictation  PLAN OF CARE: Admit for overnight observation  PATIENT DISPOSITION:  PACU - hemodynamically stable.   Delay start of Pharmacological VTE agent (>24hrs) due to surgical blood loss or risk of bleeding: yes

## 2019-04-19 NOTE — H&P (Signed)
Alexander [name] is an 68 y.o. male.   Chief Complaint: neck pain and upper extremity radiculopathy HPI: 68 year old white male history of C3-4 and C4-5 HNP/stenosis, neck pain and left shoulder pain comes in for preop evaluation.  States that symptoms unchanged from previous visit.  He is wanting to proceed with C3-4 and C4-5 ACDF as scheduled.  We received preop medical and cardiac clearance.  Past Medical History:  Diagnosis Date  . Barrett's esophagus 09/30/2013  . Bipolar 1 disorder, mixed ([place]) 09/30/2013   [facility], Dr. Ardine Eng   . BPH (benign prostatic hyperplasia) 09/30/2013  . Bradycardia   . Diverticulitis 2007  . Essential hypertension, benign 09/30/2013  . GERD (gastroesophageal reflux disease)   . Sleep apnea     Past Surgical History:  Procedure Laterality Date  . COLOSTOMY REVERSAL    . large bowel perforation      Family History  Problem Relation Age of Onset  . CAD Neg Hx    Social History:  reports that he quit smoking about 35 years ago. He has never used smokeless tobacco. He reports current alcohol use of about 2.0 - 4.0 standard drinks of alcohol per week. He reports that he does not use drugs.  Allergies:  Allergies  Allergen Reactions  . Effexor [Venlafaxine]     Had a bad withdrawal reaction after being off for 20 days    Medications Prior to Admission  Medication Sig Dispense Refill  . AMBULATORY NON FORMULARY MEDICATION Increase Auto-PAP machine with heated humidifier settings to 9-20cm H2O.  Dx: Obstructive sleep apnea. 1 Units 0  . amLODipine (NORVASC) 10 MG tablet Take one tablet daily. (Patient taking differently: Take 10 mg by mouth daily. ) 90 tablet 3  . aspirin 81 MG tablet Take 81 mg by mouth every other day.     [name] atorvastatin (LIPITOR) 20 MG tablet Take 1 tablet (20 mg total) by mouth daily. 90 tablet 3  . benazepril (LOTENSIN) 20 MG tablet Take 1 tablet (20 mg total) by mouth daily. 90 tablet 3  . clonazePAM (KLONOPIN) 0.5 MG tablet  Take 1 tablet (0.5 mg total) by mouth 2 (two) times daily as needed for anxiety. 30 tablet 5  . cyclobenzaprine (FLEXERIL) 10 MG tablet Take 1 tablet (10 mg total) by mouth 3 (three) times daily as needed for muscle spasms. 30 tablet 0  . doxazosin (CARDURA) 4 MG tablet Take 1 tablet (4 mg total) by mouth 2 (two) times daily. 180 tablet 1  . gabapentin (NEURONTIN) 600 MG tablet Two tabs PO TID (Patient taking differently: Take 600 mg by mouth 2 (two) times daily. ) 180 tablet 3  . HYDROcodone-acetaminophen (NORCO/VICODIN) 5-325 MG tablet Take one tablet daily as needed for moderate to severe pain. (Patient taking differently: Take 1 tablet by mouth daily as needed for severe pain. ) 30 tablet 0  . lithium carbonate (ESKALITH) 450 MG CR tablet Take 450 mg by mouth 2 (two) times daily.    . Omega-3 Fatty Acids (FISH OIL) 1000 MG CAPS One by mouth twice a day with meals.  0  . pantoprazole (PROTONIX) 40 MG tablet TAKE 1 TABLET (40 MG TOTAL) BY MOUTH 2 (TWO) TIMES DAILY. (Patient taking differently: Take 40 mg by mouth 2 (two) times daily. ) 180 tablet 3  . rOPINIRole (REQUIP) 0.25 MG tablet Take 1 tablet (0.25 mg total) by mouth at bedtime. For restless legs. 90 tablet 3  . sertraline (ZOLOFT) 100 MG tablet Take 100 mg  by mouth daily.    [name] azelastine (ASTELIN) 0.1 % nasal spray Place 2 sprays into both nostrils 2 (two) times daily. Use in each nostril as directed (Patient not taking: Reported on 04/13/2019) 30 mL 0  . hyoscyamine (LEVBID) 0.375 MG 12 hr tablet Take 1 tablet (0.375 mg total) by mouth 2 (two) times daily. (Patient not taking: Reported on 04/13/2019) 60 tablet 1  . promethazine (PHENERGAN) 25 MG tablet Take 1 tablet (25 mg total) by mouth every 6 (six) hours as needed for nausea or vomiting. (Patient not taking: Reported on 04/13/2019) 30 tablet 2  . sucralfate (CARAFATE) 1 g tablet TAKE 1 TABLET (1 G TOTAL) BY MOUTH 4 (FOUR) TIMES DAILY. (Patient not taking: Reported on 04/13/2019) 360 tablet 0     No results found for this or any previous visit (from the past 48 hour(s)). No results found.  Review of Systems  Constitutional: Negative.   HENT: Negative.   Respiratory: Negative.   Cardiovascular: Negative.   Musculoskeletal: Positive for neck pain.  Skin: Negative.   Neurological: Positive for tingling.  Psychiatric/Behavioral: Negative.     Blood pressure (!) 176/72, pulse (!) 54, temperature 97.7 F (36.5 C), temperature source Oral, resp. rate 18, height 5' 10.5" (1.791 m), weight 92.1 kg, SpO2 99 %. Physical Exam  Constitutional: He is oriented to person, place, and time. No distress.  HENT:  Head: Normocephalic.  Eyes: Pupils are equal, round, and reactive to light. EOM are normal.  Neck: Normal range of motion.  Cardiovascular: Normal rate.  bradycardic  Respiratory: Effort normal and breath sounds normal. No respiratory distress.  GI: Soft. He exhibits no distension. There is no abdominal tenderness.  Musculoskeletal:        General: Tenderness present.  Neurological: He is alert and oriented to person, place, and time.  Skin: Skin is warm and dry.  Psychiatric: He has a normal mood and affect.     Assessment/Plan C3-4 and C4-5 stenosis/HNP  proceed with C3-4 and C4-5 ACDF as scheduled..Surgical procedure along with potential hospital stay discussed.  Cardiologist would like patient to be admitted to telemetry unit postop.  All questions answered. Benjiman Core, PA-C 04/19/2019, 7:28 AM

## 2019-04-19 NOTE — Evaluation (Signed)
Physical Therapy Evaluation Patient Details Name: Alexander [name] MRN: [id number] DOB: [birth year] Today's Date: 04/19/2019   History of Present Illness  Pt is a 68 y/o male s/p C3-5 ACDF. PMH includes bipolar disorder and HTN.   Clinical Impression  Patient is s/p above surgery resulting in the deficits listed below (see PT Problem List). Pt with unsteadiness during gait, requiring min A and use of IV pole. Pt just received medications, so likely culprit for shakiness. Educated about back precautions and generalized walking program. Reports he plans to stay with his daughter at d/c.  Patient will benefit from skilled PT to increase their independence and safety with mobility (while adhering to their precautions) to allow discharge to the venue listed below.     Follow Up Recommendations No PT follow up;Supervision for mobility/OOB    Equipment Recommendations  None recommended by PT    Recommendations for Other Services       Precautions / Restrictions Precautions Precautions: Cervical Precaution Booklet Issued: Yes (comment) Precaution Comments: Reviewed cervical precautions with pt.  Required Braces or Orthoses: Cervical Brace Cervical Brace: Soft collar Restrictions Weight Bearing Restrictions: No      Mobility  Bed Mobility Overal bed mobility: Needs Assistance Bed Mobility: Rolling;Sidelying to Sit;Sit to Sidelying Rolling: Min guard Sidelying to sit: Min guard     Sit to sidelying: Min guard General bed mobility comments: Min guard to ensure log roll technique. Cues for sequencing.   Transfers Overall transfer level: Needs assistance Equipment used: 1 person hand held assist Transfers: Sit to/from Stand Sit to Stand: Min assist         General transfer comment: Min A for lift assist and steadying. Some shakiness noted in standing.   Ambulation/Gait Ambulation/Gait assistance: Min assist Gait Distance (Feet): 100 Feet Assistive device: IV Pole Gait  Pattern/deviations: Step-through pattern;Decreased stride length Gait velocity: Decreased   General Gait Details: Slow, very cautious gait. Increased shakiness noted and required min A for steadying. Educated about generalized walking program to perform at home.   Stairs            Wheelchair Mobility    Modified Rankin (Stroke Patients Only)       Balance Overall balance assessment: Needs assistance Sitting-balance support: No upper extremity supported;Feet supported Sitting balance-Leahy Scale: Fair     Standing balance support: Single extremity supported;During functional activity Standing balance-Leahy Scale: Poor Standing balance comment: Reliant on at least 1 UE support and external support.                              Pertinent Vitals/Pain Pain Assessment: Faces Faces Pain Scale: Hurts even more Pain Location: neck Pain Descriptors / Indicators: Aching;Operative site guarding Pain Intervention(s): Limited activity within patient's tolerance;Monitored during session;Repositioned    Home Living Family/patient expects to be discharged to:: Private residence Living Arrangements: Alone Available Help at Discharge: Family;Available 24 hours/day Type of Home: House Home Access: Stairs to enter Entrance Stairs-Rails: (unsure if daughter has rail ) Technical brewer of Steps: 3 Home Layout: Two level;Able to live on main level with bedroom/bathroom Home Equipment: Gilford Rile - 2 wheels;Cane - single point;Other (comment)(gait belt ) Additional Comments: Plans to go stay with his daughter at d/c     Prior Function Level of Independence: Independent               Hand Dominance        Extremity/Trunk Assessment   Upper  Extremity Assessment Upper Extremity Assessment: Defer to OT evaluation    Lower Extremity Assessment Lower Extremity Assessment: Generalized weakness    Cervical / Trunk Assessment Cervical / Trunk Assessment: Other  exceptions Cervical / Trunk Exceptions: s/p ACDF  Communication   Communication: No difficulties  Cognition Arousal/Alertness: Suspect due to medications Behavior During Therapy: WFL for tasks assessed/performed Overall Cognitive Status: Within Functional Limits for tasks assessed                                 General Comments: Pt initially very sleepy, but easily arousable.       General Comments      Exercises     Assessment/Plan    PT Assessment Patient needs continued PT services  PT Problem List Decreased strength;Decreased balance;Decreased activity tolerance;Decreased mobility;Decreased knowledge of use of DME;Decreased knowledge of precautions;Pain       PT Treatment Interventions DME instruction;Gait training;Stair training;Therapeutic activities;Functional mobility training;Therapeutic exercise;Balance training;Patient/family education    PT Goals (Current goals can be found in the Care Plan section)  Acute Rehab PT Goals Patient Stated Goal: to go home PT Goal Formulation: With patient Time For Goal Achievement: 05/03/19 Potential to Achieve Goals: Good    Frequency Min 5X/week   Barriers to discharge        Co-evaluation               AM-PAC PT "6 Clicks" Mobility  Outcome Measure Help needed turning from your back to your side while in a flat bed without using bedrails?: A Little Help needed moving from lying on your back to sitting on the side of a flat bed without using bedrails?: A Little Help needed moving to and from a bed to a chair (including a wheelchair)?: A Little Help needed standing up from a chair using your arms (e.g., wheelchair or bedside chair)?: A Little Help needed to walk in hospital room?: A Little Help needed climbing 3-5 steps with a railing? : A Lot 6 Click Score: 17    End of Session Equipment Utilized During Treatment: Gait belt;Cervical collar Activity Tolerance: Patient tolerated treatment  well Patient left: in bed;with call bell/phone within reach;with bed alarm set Nurse Communication: Mobility status PT Visit Diagnosis: Unsteadiness on feet (R26.81);Muscle weakness (generalized) (M62.81)    Time: [id number] PT Time Calculation (min) (ACUTE ONLY): 20 min   Charges:   PT Evaluation $PT Eval Low Complexity: [place], PT, DPT  Acute Rehabilitation Services  Pager: [phone] Office: [phone]   Rudean Hitt 04/19/2019, 5:15 PM

## 2019-04-19 NOTE — Interval H&P Note (Signed)
History and Physical Interval Note:  04/19/2019 7:35 AM  Alexander [name]  has presented today for surgery, with the diagnosis of herniated nucleus pulposus C3-4, C4-5.  The various methods of treatment have been discussed with the patient and family. After consideration of risks, benefits and other options for treatment, the patient has consented to  Procedure(s): ANTERIOR CERVICAL DISCECTOMY FUSION C3-4, C4-5 (N/A) as a surgical intervention.  The patient's history has been reviewed, patient examined, no change in status, stable for surgery.  I have reviewed the patient's chart and labs.  Questions were answered to the patient's satisfaction.     Basil Dess

## 2019-04-19 NOTE — Anesthesia Postprocedure Evaluation (Signed)
Anesthesia Post Note  Patient: Alexander [name]  Procedure(s) Performed: ANTERIOR CERVICAL DISCECTOMY FUSION cervical three to four, cervical four to five. (N/A Spine Cervical)     Patient location during evaluation: PACU Anesthesia Type: General Level of consciousness: awake and alert, oriented and patient cooperative Pain management: pain level controlled Vital Signs Assessment: post-procedure vital signs reviewed and stable Respiratory status: spontaneous breathing, nonlabored ventilation and respiratory function stable Cardiovascular status: blood pressure returned to baseline and stable Postop Assessment: no apparent nausea or vomiting Anesthetic complications: no    Last Vitals:  Vitals:   04/19/19 1235 04/19/19 1259  BP: (!) 156/69   Pulse: 65   Resp: 19   Temp: 36.9 C   SpO2: 98% 95%    Last Pain:  Vitals:   04/19/19 1235  TempSrc:   PainSc: 0-No pain                 [name]

## 2019-04-19 NOTE — Progress Notes (Signed)
Patient states that although he has brought his  CPAP machine from home, he does not wish to wear CPAP tonight due to the collar he is wearing.  Patient was advised to contact RN or RT if he changes his mind and requires assistance.

## 2019-04-19 NOTE — Anesthesia Procedure Notes (Signed)
Procedure Name: Intubation Date/Time: 04/19/2019 7:52 AM Performed by: Lance Coon, CRNA Pre-anesthesia Checklist: Patient identified, Emergency Drugs available, Suction available, Patient being monitored and Timeout performed Patient Re-evaluated:Patient Re-evaluated prior to induction Oxygen Delivery Method: Circle system utilized Preoxygenation: Pre-oxygenation with 100% oxygen Induction Type: IV induction Ventilation: Mask ventilation without difficulty and Oral airway inserted - appropriate to patient size Laryngoscope Size: Glidescope and 4 Grade View: Grade I Tube type: Oral Tube size: 7.5 mm Number of attempts: 1 Airway Equipment and Method: Stylet Placement Confirmation: ETT inserted through vocal cords under direct vision,  positive ETCO2 and breath sounds checked- equal and bilateral Secured at: 23 cm Tube secured with: Tape Dental Injury: Teeth and Oropharynx as per pre-operative assessment

## 2019-04-19 NOTE — Transfer of Care (Signed)
Immediate Anesthesia Transfer of Care Note  Patient: Alexander [name]  Procedure(s) Performed: ANTERIOR CERVICAL DISCECTOMY FUSION cervical three to four, cervical four to five. (N/A Spine Cervical)  Patient Location: PACU  Anesthesia Type:General  Level of Consciousness: drowsy and patient cooperative  Airway & Oxygen Therapy: Patient Spontanous Breathing and Patient connected to nasal cannula oxygen  Post-op Assessment: Report given to RN and Post -op Vital signs reviewed and stable  Post vital signs: Reviewed and stable  Last Vitals:  Vitals Value Taken Time  BP    Temp    Pulse    Resp    SpO2      Last Pain:  Vitals:   04/19/19 0627  TempSrc: Oral  PainSc:       Patients Stated Pain Goal: 3 ([id number])  Complications: No apparent anesthesia complications

## 2019-04-19 NOTE — H&P (Signed)
PREOPERATIVE H&P  Chief Complaint: herniated nucleus pulposus C3-4, C4-5  HPI: Alexander [name] is a 68 y.o. male who presents for preoperative history and physical with a diagnosis of herniated nucleus pulposus C3-4, C4-5. Symptoms are rated as moderate to severe, and have been worsening.  This is significantly impairing activities of daily living.  He has elected for surgical management.   Past Medical History:  Diagnosis Date  . Barrett's esophagus 09/30/2013  . Bipolar 1 disorder, mixed ([place]) 09/30/2013   [facility], Dr. Ardine Eng   . BPH (benign prostatic hyperplasia) 09/30/2013  . Bradycardia   . Diverticulitis 2007  . Essential hypertension, benign 09/30/2013  . GERD (gastroesophageal reflux disease)   . Sleep apnea    Past Surgical History:  Procedure Laterality Date  . COLOSTOMY REVERSAL    . large bowel perforation     Social History   Socioeconomic History  . Marital status: Married    Spouse name: Not on file  . Number of children: Not on file  . Years of education: Not on file  . Highest education level: Not on file  Occupational History  . Not on file  Social Needs  . Financial resource strain: Not on file  . Food insecurity    Worry: Not on file    Inability: Not on file  . Transportation needs    Medical: Not on file    Non-medical: Not on file  Tobacco Use  . Smoking status: Former Smoker    Quit date: 10/01/1983    Years since quitting: 35.5  . Smokeless tobacco: Never Used  Substance and Sexual Activity  . Alcohol use: Yes    Alcohol/week: 2.0 - 4.0 standard drinks    Types: 2 - 4 Cans of beer per week    Comment: daily  . Drug use: No  . Sexual activity: Not Currently    Partners: Female  Lifestyle  . Physical activity    Days per week: Not on file    Minutes per session: Not on file  . Stress: Not on file  Relationships  . Social Herbalist on phone: Not on file    Gets together: Not on file    Attends religious service: Not  on file    Active member of club or organization: Not on file    Attends meetings of clubs or organizations: Not on file    Relationship status: Not on file  Other Topics Concern  . Not on file  Social History Narrative  . Not on file   Family History  Problem Relation Age of Onset  . CAD Neg Hx    Allergies  Allergen Reactions  . Effexor [Venlafaxine]     Had a bad withdrawal reaction after being off for 20 days   Prior to Admission medications   Medication Sig Start Date End Date Taking? Authorizing Provider  AMBULATORY NON FORMULARY MEDICATION Increase Auto-PAP machine with heated humidifier settings to 9-20cm H2O.  Dx: Obstructive sleep apnea. 11/26/16  Yes Breeback, Jade L, PA-C  amLODipine (NORVASC) 10 MG tablet Take one tablet daily. Patient taking differently: Take 10 mg by mouth daily.  09/03/18  Yes Breeback, Jade L, PA-C  aspirin 81 MG tablet Take 81 mg by mouth every other day.    Yes [provider]  atorvastatin (LIPITOR) 20 MG tablet Take 1 tablet (20 mg total) by mouth daily. 09/03/18  Yes Breeback, Jade L, PA-C  benazepril (LOTENSIN) 20 MG tablet Take  1 tablet (20 mg total) by mouth daily. 09/03/18  Yes Breeback, Jade L, PA-C  clonazePAM (KLONOPIN) 0.5 MG tablet Take 1 tablet (0.5 mg total) by mouth 2 (two) times daily as needed for anxiety. 01/27/18  Yes Breeback, Jade L, PA-C  cyclobenzaprine (FLEXERIL) 10 MG tablet Take 1 tablet (10 mg total) by mouth 3 (three) times daily as needed for muscle spasms. 01/27/18  Yes Breeback, Jade L, PA-C  doxazosin (CARDURA) 4 MG tablet Take 1 tablet (4 mg total) by mouth 2 (two) times daily. 09/03/18  Yes Breeback, Jade L, PA-C  gabapentin (NEURONTIN) 600 MG tablet Two tabs PO TID Patient taking differently: Take 600 mg by mouth 2 (two) times daily.  01/31/19  Yes Breeback, Jade L, PA-C  HYDROcodone-acetaminophen (NORCO/VICODIN) 5-325 MG tablet Take one tablet daily as needed for moderate to severe pain. Patient taking  differently: Take 1 tablet by mouth daily as needed for severe pain.  03/17/19  Yes Jessy Oto, MD  lithium carbonate (ESKALITH) 450 MG CR tablet Take 450 mg by mouth 2 (two) times daily.   Yes [provider]  Omega-3 Fatty Acids (FISH OIL) 1000 MG CAPS One by mouth twice a day with meals. 03/01/15  Yes Hommel, Sean, DO  pantoprazole (PROTONIX) 40 MG tablet TAKE 1 TABLET (40 MG TOTAL) BY MOUTH 2 (TWO) TIMES DAILY. Patient taking differently: Take 40 mg by mouth 2 (two) times daily.  09/03/18  Yes Breeback, Jade L, PA-C  rOPINIRole (REQUIP) 0.25 MG tablet Take 1 tablet (0.25 mg total) by mouth at bedtime. For restless legs. 09/03/18  Yes Breeback, Jade L, PA-C  sertraline (ZOLOFT) 100 MG tablet Take 100 mg by mouth daily.   Yes [provider]  azelastine (ASTELIN) 0.1 % nasal spray Place 2 sprays into both nostrils 2 (two) times daily. Use in each nostril as directed Patient not taking: Reported on 04/13/2019 08/26/17   Donella Stade, PA-C  hyoscyamine (LEVBID) 0.375 MG 12 hr tablet Take 1 tablet (0.375 mg total) by mouth 2 (two) times daily. Patient not taking: Reported on 04/13/2019 05/28/18   Donella Stade, PA-C  promethazine (PHENERGAN) 25 MG tablet Take 1 tablet (25 mg total) by mouth every 6 (six) hours as needed for nausea or vomiting. Patient not taking: Reported on 04/13/2019 09/24/18   Gregor Hams, MD  sucralfate (CARAFATE) 1 g tablet TAKE 1 TABLET (1 G TOTAL) BY MOUTH 4 (FOUR) TIMES DAILY. Patient not taking: Reported on 04/13/2019 02/28/19   Iran Planas L, PA-C     Positive ROS: All other systems have been reviewed and were otherwise negative with the exception of those mentioned in the HPI and as above.  Physical Exam: General: Alert, no acute distress Cardiovascular: No pedal edema Respiratory: No cyanosis, no use of accessory musculature GI: No organomegaly, abdomen is soft and non-tender Skin: No lesions in the area of chief complaint Neurologic: Sensation  intact distally Psychiatric: Patient is competent for consent with normal mood and affect Lymphatic: No axillary or cervical lymphadenopathy  MUSCULOSKELETAL: Left shoulder abduction and external rotation weakness. Positive spurling sign with extension and left lateral rotation and flexion.   Assessment: herniated nucleus pulposus C3-4, C4-5 Cervical spondylosis with left cervical radiculopathy.  Plan: Plan for Procedure(s): ANTERIOR CERVICAL DISCECTOMY FUSION C3-4, C4-5  The risks benefits and alternatives were discussed with the patient including but not limited to the risks of nonoperative treatment, versus surgical intervention including infection, bleeding, nerve injury,  blood clots, cardiopulmonary complications,  morbidity, mortality, among others, and they were willing to proceed.   Basil Dess, MD Cell [phone] Office [phone] 04/19/2019 7:31 AM

## 2019-04-20 DIAGNOSIS — M5021 Other cervical disc displacement,  high cervical region: Secondary | ICD-10-CM | POA: Diagnosis not present

## 2019-04-20 LAB — CBC
HCT: 38.5 % — ABNORMAL LOW (ref 39.0–52.0)
Hemoglobin: 13.4 g/dL (ref 13.0–17.0)
MCH: 33.1 pg (ref 26.0–34.0)
MCHC: 34.8 g/dL (ref 30.0–36.0)
MCV: 95.1 fL (ref 80.0–100.0)
Platelets: 195 10*3/uL (ref 150–400)
RBC: 4.05 MIL/uL — ABNORMAL LOW (ref 4.22–5.81)
RDW: 13.1 % (ref 11.5–15.5)
WBC: 12.8 10*3/uL — ABNORMAL HIGH (ref 4.0–10.5)
nRBC: 0 % (ref 0.0–0.2)

## 2019-04-20 LAB — BASIC METABOLIC PANEL
Anion gap: 8 (ref 5–15)
BUN: 7 mg/dL — ABNORMAL LOW (ref 8–23)
CO2: 25 mmol/L (ref 22–32)
Calcium: 10.2 mg/dL (ref 8.9–10.3)
Chloride: 106 mmol/L (ref 98–111)
Creatinine, Ser: 0.86 mg/dL (ref 0.61–1.24)
GFR calc Af Amer: >60 mL/min (ref >60)
GFR calc non Af Amer: >60 mL/min (ref >60)
Glucose, Bld: 124 mg/dL — ABNORMAL HIGH (ref 70–99)
Potassium: 3.9 mmol/L (ref 3.5–5.1)
Sodium: 139 mmol/L (ref 135–145)

## 2019-04-20 MED ORDER — HYDROCODONE-ACETAMINOPHEN 7.5-325 MG PO TABS: 1.0000 | ORAL_TABLET | Freq: Four times a day (QID) | ORAL | 0 refills | Status: AC

## 2019-04-20 MED ORDER — DOCUSATE SODIUM 100 MG PO CAPS: 100.0000 mg | ORAL_CAPSULE | Freq: Two times a day (BID) | ORAL | 0 refills | Status: DC

## 2019-04-20 NOTE — Plan of Care (Signed)
Patient alert and oriented, mae's well, voiding adequate amount of urine, swallowing without difficulty, no c/o pain at time of discharge. Patient discharged home with family. Script and discharged instructions given to patient. Patient and family stated understanding of instructions given. Patient has an appointment with Dr. Nitka 

## 2019-04-20 NOTE — Evaluation (Signed)
Occupational Therapy Evaluation Patient Details Name: Alexander [name] MRN: [id number] DOB: [birth year] Today's Date: 04/20/2019    History of Present Illness Pt is a 68 y/o male s/p C3-5 ACDF. PMH includes bipolar disorder and HTN.    Clinical Impression   Pt PTA: home alone, plans to live with daughter at d/c. Pt reports independence with ADL and mobility. Pt currently supervisionA for self care of UB and LB ADL. Pt provided hip kit education for LB dressing and bathing with supervisionA with AE provided. Pt grooming at sink for ADL tasks with no difficulty. Pt given instructions for setting an alarm at night for medication, avoid sitting for long periods of time, correct bed positioning for sleeping, correct sequence for bed mobility, avoiding lifting more than 5 pounds and never wash directly over incision. All education is complete and patient indicates understanding. Pt does not require continued OT skilled services. OT signing off.      Follow Up Recommendations  No OT follow up    Equipment Recommendations  None recommended by OT    Recommendations for Other Services       Precautions / Restrictions Precautions Precautions: Cervical Precaution Booklet Issued: Yes (comment) Precaution Comments: Reviewed cervical precautions with pt.  Required Braces or Orthoses: Cervical Brace Cervical Brace: Soft collar Restrictions Weight Bearing Restrictions: No      Mobility Bed Mobility Overal bed mobility: Needs Assistance Bed Mobility: Rolling;Sidelying to Sit;Sit to Sidelying Rolling: Supervision Sidelying to sit: Supervision     Sit to sidelying: Supervision General bed mobility comments: supervisionA for log roll technique  Transfers Overall transfer level: Needs assistance Equipment used: None Transfers: Sit to/from Stand Sit to Stand: Min guard         General transfer comment: minguardA for instability    Balance Overall balance assessment: Needs  assistance Sitting-balance support: No upper extremity supported;Feet supported Sitting balance-Leahy Scale: Fair     Standing balance support: No upper extremity supported;During functional activity Standing balance-Leahy Scale: Poor Standing balance comment: no AD used                           ADL either performed or assessed with clinical judgement   ADL Overall ADL's : Needs assistance/impaired Eating/Feeding: Modified independent   Grooming: Supervision/safety;Sitting   Upper Body Bathing: Supervision/ safety;Sitting   Lower Body Bathing: Set up;Cueing for safety;Cueing for sequencing;Sitting/lateral leans;Sit to/from stand;With adaptive equipment   Upper Body Dressing : Supervision/safety;Sitting   Lower Body Dressing: Supervision/safety;Set up;With adaptive equipment   Toilet Transfer: Supervision/safety;Comfort height toilet;Grab bars   Toileting- Clothing Manipulation and Hygiene: Supervision/safety       Functional mobility during ADLs: Min guard General ADL Comments: Pt provided hip kit education for LB dressing and bathing with supervisionA with AE provided. Pt grooming at sink for ADL tasks with no difficulty.      Vision Baseline Vision/History: Wears glasses Wears Glasses: At all times Patient Visual Report: No change from baseline Vision Assessment?: No apparent visual deficits     Perception     Praxis      Pertinent Vitals/Pain Pain Assessment: Faces Faces Pain Scale: Hurts little more Pain Location: neck Pain Descriptors / Indicators: Aching;Operative site guarding Pain Intervention(s): Limited activity within patient's tolerance     Hand Dominance Right   Extremity/Trunk Assessment Upper Extremity Assessment Upper Extremity Assessment: Generalized weakness   Lower Extremity Assessment Lower Extremity Assessment: Defer to PT evaluation;Generalized weakness   Cervical / Trunk  Assessment Cervical / Trunk Assessment: Other  exceptions Cervical / Trunk Exceptions: s/p ACDF   Communication Communication Communication: No difficulties   Cognition Arousal/Alertness: Suspect due to medications Behavior During Therapy: WFL for tasks assessed/performed Overall Cognitive Status: Within Functional Limits for tasks assessed                                     General Comments       Exercises     Shoulder Instructions      Home Living Family/patient expects to be discharged to:: Private residence Living Arrangements: Alone Available Help at Discharge: Family;Available 24 hours/day Type of Home: House Home Access: Stairs to enter [employer] of Steps: 3   Home Layout: Two level;Able to live on main level with bedroom/bathroom     Bathroom Shower/Tub: Tub/shower unit;Walk-in shower   Bathroom Toilet: Standard     Home Equipment: Environmental consultant - 2 wheels;Cane - single point;Other (comment)   Additional Comments: Plans to go stay with his daughter at d/c       Prior Functioning/Environment Level of Independence: Independent                 OT Problem List: Decreased strength;Decreased activity tolerance;Impaired balance (sitting and/or standing);Decreased coordination;Decreased safety awareness;Pain;Decreased knowledge of use of DME or AE      OT Treatment/Interventions:      OT Goals(Current goals can be found in the care plan section) Acute Rehab OT Goals Patient Stated Goal: to go home OT Goal Formulation: With patient  OT Frequency:     Barriers to D/C:            Co-evaluation              AM-PAC OT "6 Clicks" Daily Activity     Outcome Measure Help from another person eating meals?: None Help from another person taking care of personal grooming?: None Help from another person toileting, which includes using toliet, bedpan, or urinal?: None Help from another person bathing (including washing, rinsing, drying)?: A Little Help from another person to  put on and taking off regular upper body clothing?: None Help from another person to put on and taking off regular lower body clothing?: None 6 Click Score: 23   End of Session Equipment Utilized During Treatment: Cervical collar Nurse Communication: Mobility status  Activity Tolerance: Patient limited by pain Patient left: in bed;with call bell/phone within reach  OT Visit Diagnosis: Unsteadiness on feet (R26.81);Muscle weakness (generalized) (M62.81)                Time: 1121-6244 OT Time Calculation (min): 30 min Charges:  OT General Charges $OT Visit: 1 Visit OT Evaluation $OT Eval Moderate Complexity: 1 Mod OT Treatments $Self Care/Home Management : 8-22 mins  Ebony Hail Harold Hedge) Marsa Aris OTR/L Acute Rehabilitation Services Pager: [phone] Office: [phone]   Alli Jasmer J Arlisha Patalano 04/20/2019, 10:00 AM

## 2019-04-20 NOTE — Progress Notes (Signed)
Physical Therapy Treatment Patient Details Name: Alexander [name] MRN: [id number] DOB: [birth year] Today's Date: 04/20/2019    History of Present Illness Pt is a 68 y/o male s/p C3-5 ACDF. PMH includes bipolar disorder and HTN.     PT Comments    Pt progressing towards physical therapy goals. Was able to perform transfers and ambulation with gross min guard assist to supervision for safety without an AD. Pt very flexed and reports this is baseline prior to surgery. Educated on ways to increase trunk extension to neutral and was able to make good corrective changes. Pt also educated on car transfer, precautions, activity progression, and brace wearing schedule. Will continue to follow and progress as able per POC.     Follow Up Recommendations  No PT follow up;Supervision for mobility/OOB     Equipment Recommendations  None recommended by PT    Recommendations for Other Services       Precautions / Restrictions Precautions Precautions: Cervical Precaution Booklet Issued: Yes (comment) Precaution Comments: Reviewed cervical precautions with pt.  Required Braces or Orthoses: Cervical Brace Cervical Brace: Soft collar Restrictions Weight Bearing Restrictions: No    Mobility  Bed Mobility Overal bed mobility: Needs Assistance Bed Mobility: Rolling;Sidelying to Sit;Sit to Sidelying Rolling: Modified independent (Device/Increase time) Sidelying to sit: Supervision     Sit to sidelying: Supervision General bed mobility comments: Light supervision for safety but no assist required. Pt needed several cues to initiate log roll vs just lying down to supine.   Transfers Overall transfer level: Needs assistance Equipment used: None Transfers: Sit to/from Stand Sit to Stand: Supervision         General transfer comment: Supervision and cues for improved posture.   Ambulation/Gait Ambulation/Gait assistance: Supervision Gait Distance (Feet): 400 Feet Assistive device:  None Gait Pattern/deviations: Step-through pattern;Decreased stride length;Trunk flexed Gait velocity: Decreased Gait velocity interpretation: 1.31 - 2.62 ft/sec, indicative of limited community ambulator General Gait Details: Slow but generally steady. VC's for improved posture.    Stairs Stairs: Yes Stairs assistance: Min guard;Supervision Stair Management: One rail Right;Step to pattern;Forwards Number of Stairs: 4 General stair comments: VC's for sequencing and general safety.    Wheelchair Mobility    Modified Rankin (Stroke Patients Only)       Balance Overall balance assessment: Needs assistance Sitting-balance support: No upper extremity supported;Feet supported Sitting balance-Leahy Scale: Fair     Standing balance support: No upper extremity supported;During functional activity Standing balance-Leahy Scale: Fair Standing balance comment: no AD used                            Cognition Arousal/Alertness: Awake/alert Behavior During Therapy: WFL for tasks assessed/performed Overall Cognitive Status: Within Functional Limits for tasks assessed                                        Exercises      General Comments        Pertinent Vitals/Pain Pain Assessment: Faces Faces Pain Scale: Hurts little more Pain Location: neck Pain Descriptors / Indicators: Aching;Operative site guarding Pain Intervention(s): Monitored during session;Repositioned    Home Living Family/patient expects to be discharged to:: Private residence Living Arrangements: Alone Available Help at Discharge: Family;Available 24 hours/day Type of Home: House Home Access: Stairs to enter   Home Layout: Two level;Able to live on main level with  bedroom/bathroom Home Equipment: Walker - 2 wheels;Cane - single point;Other (comment) Additional Comments: Plans to go stay with his daughter at d/c     Prior Function Level of Independence: Independent           PT Goals (current goals can now be found in the care plan section) Acute Rehab PT Goals Patient Stated Goal: to go home PT Goal Formulation: With patient Time For Goal Achievement: 05/03/19 Potential to Achieve Goals: Good Progress towards PT goals: Progressing toward goals    Frequency    Min 5X/week      PT Plan Current plan remains appropriate    Co-evaluation              AM-PAC PT "6 Clicks" Mobility   Outcome Measure  Help needed turning from your back to your side while in a flat bed without using bedrails?: None Help needed moving from lying on your back to sitting on the side of a flat bed without using bedrails?: None Help needed moving to and from a bed to a chair (including a wheelchair)?: None Help needed standing up from a chair using your arms (e.g., wheelchair or bedside chair)?: None Help needed to walk in hospital room?: None Help needed climbing 3-5 steps with a railing? : None 6 Click Score: 24    End of Session Equipment Utilized During Treatment: Gait belt;Cervical collar Activity Tolerance: Patient tolerated treatment well Patient left: with call bell/phone within reach;in bed Nurse Communication: Mobility status PT Visit Diagnosis: Unsteadiness on feet (R26.81);Muscle weakness (generalized) (M62.81)     Time: [id number] PT Time Calculation (min) (ACUTE ONLY): 13 min  Charges:  $Gait Training: 8-22 mins                     Rolinda Roan, PT, DPT Acute Rehabilitation Services Pager: [phone] Office: [phone]    Thelma Comp 04/20/2019, 10:20 AM

## 2019-04-20 NOTE — TOC Transition Note (Addendum)
Transition of Care [facility]) - CM/SW Discharge Note   Patient Details  Name: LOGEN KYNE MRN: [id number] Date of Birth: August 10, 1950  [facility] CM/SW Contact:  Sharin Mons, RN Phone Number: 04/20/2019, 3:14 PM   Clinical Narrative:      s/p C3-5 ACDF, 04/19/2019.  PMH includes bipolar disorder and HTN. Pt will transition to home with daughter today. Health health services start of care within 24-48 hrs.  York Pellant (Daughter)  Pt's cell # [phone]   [phone]            Final next level of care: Home w Home Health Services Barriers to Discharge: No Barriers Identified   Patient Goals and CMS Choice   CMS Medicare.gov Compare Post Acute Care list provided to:: Patient Choice offered to / list presented to : Patient  Discharge Placement                       Discharge Plan and Services                          HH Arranged: PT [facility] Agency: [facility] Date [facility] Agency Contacted: 04/20/19 Time [place]: [id number] Representative spoke with at [place]: [place] ([place]) Interventions     Readmission Risk Interventions No flowsheet data found.

## 2019-04-20 NOTE — Progress Notes (Signed)
     Subjective: 1 Day Post-Op Procedure(s) (LRB): ANTERIOR CERVICAL DISCECTOMY FUSION cervical three to four, cervical four to five. (N/A)Awake, alert and oriented x 4. No pain at this point. No posterior occipital pain and left shoulder pain is not present. Sore throat. I have some pain  Medications at home. Voiding without difficulty. Soft C-Colar.  Patient reports pain as mild.    Objective:   VITALS:  Temp:  [97.6 F (36.4 C)-98.5 F (36.9 C)] 97.8 F (36.6 C) (09/16 0413) Pulse Rate:  [46-72] 46 (09/16 0413) Resp:  [16-20] 20 (09/16 0413) BP: (138-160)/(63-75) 140/64 (09/16 0413) SpO2:  [95 %-99 %] 99 % (09/16 0413)  Neurologically intact ABD soft Neurovascular intact Sensation intact distally Dorsiflexion/Plantar flexion intact Incision: scant drainage and TLS drain discontinued. No cellulitis present Compartment soft   LABS Recent Labs    04/20/19 0640  HGB 13.4  WBC 12.8*  PLT 195   Recent Labs    04/20/19 0640  NA 139  K 3.9  CL 106  CO2 25  BUN 7*  CREATININE 0.86  GLUCOSE 124*   No results for input(s): LABPT, INR in the last 72 hours.   Assessment/Plan: 1 Day Post-Op Procedure(s) (LRB): ANTERIOR CERVICAL DISCECTOMY FUSION cervical three to four, cervical four to five. (N/A)  Advance diet Up with therapy D/C IV fluids Discharge home with home health  Basil Dess 04/20/2019, 8:50 AMPatient ID: Alexander [name], male   DOB: 09/29/1950, 68 y.o.   MRN: [id number]

## 2019-04-21 ENCOUNTER — Encounter (HOSPITAL_COMMUNITY): Payer: Self-pay | Admitting: Specialist

## 2019-04-22 DIAGNOSIS — Z4789 Encounter for other orthopedic aftercare: Secondary | ICD-10-CM | POA: Diagnosis not present

## 2019-04-22 DIAGNOSIS — M4802 Spinal stenosis, cervical region: Secondary | ICD-10-CM | POA: Diagnosis not present

## 2019-04-22 DIAGNOSIS — M5090 Cervical disc disorder, unspecified, unspecified cervical region: Secondary | ICD-10-CM | POA: Diagnosis not present

## 2019-04-22 DIAGNOSIS — M5030 Other cervical disc degeneration, unspecified cervical region: Secondary | ICD-10-CM | POA: Diagnosis not present

## 2019-04-22 DIAGNOSIS — G2581 Restless legs syndrome: Secondary | ICD-10-CM | POA: Diagnosis not present

## 2019-04-22 DIAGNOSIS — M4722 Other spondylosis with radiculopathy, cervical region: Secondary | ICD-10-CM | POA: Diagnosis not present

## 2019-04-22 DIAGNOSIS — K2270 Barrett's esophagus without dysplasia: Secondary | ICD-10-CM | POA: Diagnosis not present

## 2019-04-22 DIAGNOSIS — I10 Essential (primary) hypertension: Secondary | ICD-10-CM | POA: Diagnosis not present

## 2019-04-22 DIAGNOSIS — G894 Chronic pain syndrome: Secondary | ICD-10-CM | POA: Diagnosis not present

## 2019-04-22 DIAGNOSIS — M1812 Unilateral primary osteoarthritis of first carpometacarpal joint, left hand: Secondary | ICD-10-CM | POA: Diagnosis not present

## 2019-04-24 DIAGNOSIS — M1812 Unilateral primary osteoarthritis of first carpometacarpal joint, left hand: Secondary | ICD-10-CM | POA: Diagnosis not present

## 2019-04-24 DIAGNOSIS — M4802 Spinal stenosis, cervical region: Secondary | ICD-10-CM | POA: Diagnosis not present

## 2019-04-24 DIAGNOSIS — M4722 Other spondylosis with radiculopathy, cervical region: Secondary | ICD-10-CM | POA: Diagnosis not present

## 2019-04-24 DIAGNOSIS — G894 Chronic pain syndrome: Secondary | ICD-10-CM | POA: Diagnosis not present

## 2019-04-24 DIAGNOSIS — Z4789 Encounter for other orthopedic aftercare: Secondary | ICD-10-CM | POA: Diagnosis not present

## 2019-04-24 DIAGNOSIS — M5030 Other cervical disc degeneration, unspecified cervical region: Secondary | ICD-10-CM | POA: Diagnosis not present

## 2019-04-24 DIAGNOSIS — M5090 Cervical disc disorder, unspecified, unspecified cervical region: Secondary | ICD-10-CM | POA: Diagnosis not present

## 2019-04-24 DIAGNOSIS — I10 Essential (primary) hypertension: Secondary | ICD-10-CM | POA: Diagnosis not present

## 2019-04-24 DIAGNOSIS — G2581 Restless legs syndrome: Secondary | ICD-10-CM | POA: Diagnosis not present

## 2019-04-24 DIAGNOSIS — K2270 Barrett's esophagus without dysplasia: Secondary | ICD-10-CM | POA: Diagnosis not present

## 2019-04-26 NOTE — Op Note (Signed)
04/19/2019  10:30 AM  PATIENT:  Alexander [name]  68 y.o. male  MRN: [id number]  OPERATIVE REPORT  PRE-OPERATIVE DIAGNOSIS:  herniated nucleus pulposus C3-4, C4-5  POST-OPERATIVE DIAGNOSIS:  herniated nucleus pulposus C3-4, C4-5  PROCEDURE:  Procedure(s): ANTERIOR CERVICAL DISCECTOMY FUSION cervical three to four, cervical four to five.    SURGEON:  Jessy Oto, MD     ASSISTANT:  Benjiman Core, PA-C  (Present throughout the entire procedure and necessary for completion of procedure in a timely manner)     ANESTHESIA:  General,    COMPLICATIONS:  None.     COMPONENTS:   Implant Name Type Inv. Item Serial No. Manufacturer Lot No. LRB No. Used Action  BONE VIVIGEN FORMABLE 5.4CC - [id number] Bone Implant BONE VIVIGEN FORMABLE 5.4CC [id number] [employer]  N/A 1 Implanted  SPACER ADV ACF 9MM - [id number] Bone Implant SPACER ADV ACF 9MM [id number] [employer] FNDN  N/A 1 Implanted  SPACER ADV ACF LORODTIC 8MM - [id number] Bone Implant SPACER ADV ACF LORODTIC 8MM [id number] [employer]KELE[employer] TRANSPLANT FNDN  N/A 1 Implanted  PLATE SKYLINE TWO LEVEL 32MM - [id number] Plate PLATE SKYLINE TWO LEVEL 32MM  [employer]  N/A 1 Implanted  SCREW VAR SELF TAP SKYLINE 59M - [id number] Screw SCREW VAR SELF TAP SKYLINE 59M  [employer]  N/A 6 Implanted    PROCEDURE:The patient was met in the holding area, and the appropriate Left cervical C3-4 and C4-5 level identified and marked with an "X" and my initials. The patient was then transported to OR and was placed on the operative table in a supine position. The patient was then placed under  general anesthesia without difficulty. The patient received appropriate preoperative antibiotic prophylaxis.  . Nursing staff inserted a Foley catheter under sterile conditionsCervical spine was positioned with a [place] horseshoe and 5 pound cervical halter traction. All pressure points  well padded and semi-beach chair position. Arm holder both arms. Standard prep with DuraPrep solution the anterior cervical spine chest. Draped in the usual manner. Iodine vi drape was used. Standard timeout protocol was carried out identifying the patient procedure side of the procedure and level. The skin the left neck was infiltrated with Marcaine with epinephrine total of 7 cc. This at the level of expected C4 incision and also along the  Lines of Langer. Incision transverse at the C4 level and carried down to the level of the platysma and then medial to sternocleidomastoid muscle. The interval between the trachea and esophagus medially and the carotid sheath laterally was developed as a Metzenbaum scissors and blunt dissection exposing the anterior aspect of cervical spine at the C3-4level Attention then turned to the C3-4 and C4-5 levels where an 18-gauge spinal needles were placed with sheath intact allowing only a centimeter to extend into the C3-4 and C4-5 to and observed on lateral xray at the C3-4 and anterior aspect of C4-5 level. Handheld Cloward retraction of the soft tissues while identifying the level at C3-4 and C4-5 removing a portion of the anterior aspect of the discs with15 blade scalpel and pituitary. Medial border of the longus collie muscles was carefully elevated bilaterally and self-retaining retractors were introduced the foot of the blade beneath the medial border of the longus colli muscles. Soft tissue overlying the anterior borders of the disc space at C3-4 and C4-5 level carefully debridement of soft tissue back to bony edges. The anterior lip osteophytes were then resected using rongeur. This bone was  preserved for later bone grafting purposes.Distraction pins placed first at the C4-5 level.The C4-5 disc space was then first prepared using loupe magnification and headlight with resection of degenerative disc annulus anteriorly and posteriorly and cartilaginous endplates using  micro-curettes pituitaries and a high-speed bur. The operating room microscope was draped sterilely and brought into the field the C-arm previously had been draped sterilely prior to its use. Under the operating room microscope and posterior aspect of the disc was excised using micro-curettes pituitary rongeur and times per. Posterior lip osteophytes were drilled using a high-speed bur and a carefully resected with 1 and 2 mm Kerrison foraminotomy performed over both C5 nerve roots using 1 and 2 mm Kerrisons disc herniation material was noted centrally and into both foramen some of which represented reflected portion of the patient's annulus into the neuroforamen. Following decompression of the spinal cord at both C5 nerve roots irrigation was carried out. The endplates of the inferior aspect of C4 and superior aspect of C5 were carefully prepared using a high-speed bur to parallel. The disc space was then sounded utilized and a precontoured lordotic sounder for the tranzgraft.  Sounding was carried up to a 9 mm implant.  9 mm spacer was felt to provide best fit for the C4-5 disc space. The 9 mm tranzgraft implant was then brought onto the field. It was then packed with local bone graft that had been harvested previously. The implant was then carefully placed over the intervertebral disc space at C4-5 level. Care taken to ensure that no bone or soft tissue debris within the disc space that could be retropulsed with insertion of  bone graft. Thegraft   was then impacted into place with the head placed in longitudinal cervical traction.  The graft was felt to be in excellent position alignment. Distraction pin at the C5 removed. Attention then turned to the C3-4. The Boss McCollough retractor replaced at the C3-4 level. The foot of the blades medial to the longus colli muscles. A distraction screw post placed at the C3 level and distraction of the disc space performed.The C3-4 disc space was then first prepared [place]  with resection of degenerative disc annulus anteriorly and posteriorly and cartilaginous endplates using micro-curettes pituitaries and a high-speed bur. The operating room microscope  was  used. Under the operating room microscope and posterior aspect of the disc was excised using micro-curettes pituitary rongeur and times per. Posterior lip osteophytes were drilled using a high-speed bur and a carefully resected with 1 and 2 mm Kerrison foraminotomy performed over both C4 nerve roots using 1 and 2 mm Kerrisons disc herniation material was noted centrally and into the left foramen some of which represented reflected portion of the patient's annulus into the neuroforamen. Additionally there was significant spur into the left side C4 neuroforamen affecting the right C4 nerve. Following decompression of the spinal cord at both C4 nerve roots, irrigation was carried out. Bleeding controlled using some bone wax excess bone wax removed and Gelfoam thrombin-soaked. The endplates of the inferior aspect of C3 and superior aspect of C4 were carefully prepared using a high-speed bur to parallel. The disc space was then sounded utilized and a precontoured lordotic sounder for the tranzgraft.  Sounding was carried up to a 8 mm implant.  8 mm spacer was felt to provide best fit for the C3-4 disc space. The 8 mm ACDF allograft implant was then brought onto the field. It was then packed with local bone graft that had been harvested  previously. The implant was then carefully placed over the intervertebral disc space at C3-4 level. Care taken to ensure that no bone or soft tissue debris within the disc space that could be retropulsed with insertion of the bone graft. The graft was then impacted into place with the head placed in longitudinal cervical traction.  The graft was felt to be in excellent position alignment. Distraction pins at the C3 and C4 removed. This point irrigation was carried out cervical incision site and lower  cervical incision site. Esophagus examined and the upper cervical level as well as at the lower cervical level and found to be normal. Irrigation was again carried out there was no active bleeding present.Longitudinal halter traction was discontinued. The length for cervical plate determined with cottonoid string and two bayonet forceps. Anterior lip osteophytes were then resected about the C3-4 and C4-5 levels using a high-speed bur. This area smoothed for application of the plate to the anterior surface of the cervical spine from C3-C5 A 32 mm plate chosen. The plate carefully applied to the anterior cervical spine and aligned. A single retaining screw placed at the left C4 level. Drilling 14 mm hole left side at the C5 level and a 14 mm screw placed. The retaining pin was then removed and a drill hole made on the right side at C5  14 mm and a 14 mm screw placed. A second screw then placed on the right side at the C4 level and on the left at the [place]. Then on the left side at C3 a drill to 14 mm and 14 mm screw placed. Total of 6 14 mm screws were placed each locked within the plate quite nicely. Intraoperative lateral radiograph of the cervical spine demonstrate plates and screws and bone grafts to be in good position alignment without any sign of canal impingement or retropulsion of graft. Irrigation was carried out at cervical incision site and lower cervical incision site. Esophagus examined and the upper cervical level as well as at the lower cervical level and found to be normal.   The incisions were then closed by approximating the deep subcutaneous layers the platysma layer with interrupted 2-0 Vicryl suture and the superficial fascia overlying the sternocleidomastoid muscle with interrupted 3-0 Vicryl sutures. The subcutaneous layers were approximated with interrupted 3-0 Vicryl sutures as were the superficial layers. The skin was closed with a running subcutaneous stitch of 4-0 Vicryl at  the  operative C4  transverse incision. Skin was approximated with Dermabond. Mepilex bandage was applied. A soft collar was then applied to the cervical spine. drapes were removed. Patient's or table was then returned to the flat position. At the end of the cervical spine surgery case all instrument and sponge counts were correct. Patient was then reactivated extubated and returned to recovery room in satisfactory condition.  Benjiman Core PA-C perform the duties of assistant surgeon performing careful retraction of the esophagus and trachea during the initial exposure and careful suctioning of neural elements cervical nerve roots and cervical cord. He was present from the beginning of case to the end of the case and assisted in positioning of the patient in transfer the patient from bed to the stretcher at the end of the case. He closed the incision on the platysma layer to the skin. Applied dressing.   Basil Dess 04/26/2019, 10:30 AM

## 2019-04-28 ENCOUNTER — Ambulatory Visit (INDEPENDENT_AMBULATORY_CARE_PROVIDER_SITE_OTHER): Payer: Medicare HMO | Admitting: Surgery

## 2019-04-28 ENCOUNTER — Encounter: Payer: Self-pay | Admitting: Surgery

## 2019-04-28 ENCOUNTER — Ambulatory Visit: Payer: Self-pay

## 2019-04-28 VITALS — Ht 70.5 in | Wt 194.0 lb

## 2019-04-28 DIAGNOSIS — Z981 Arthrodesis status: Secondary | ICD-10-CM

## 2019-04-29 DIAGNOSIS — Z4789 Encounter for other orthopedic aftercare: Secondary | ICD-10-CM | POA: Diagnosis not present

## 2019-04-29 DIAGNOSIS — G2581 Restless legs syndrome: Secondary | ICD-10-CM | POA: Diagnosis not present

## 2019-04-29 DIAGNOSIS — K2270 Barrett's esophagus without dysplasia: Secondary | ICD-10-CM | POA: Diagnosis not present

## 2019-04-29 DIAGNOSIS — I10 Essential (primary) hypertension: Secondary | ICD-10-CM | POA: Diagnosis not present

## 2019-04-29 DIAGNOSIS — M5030 Other cervical disc degeneration, unspecified cervical region: Secondary | ICD-10-CM | POA: Diagnosis not present

## 2019-04-29 DIAGNOSIS — M5090 Cervical disc disorder, unspecified, unspecified cervical region: Secondary | ICD-10-CM | POA: Diagnosis not present

## 2019-04-29 DIAGNOSIS — M4722 Other spondylosis with radiculopathy, cervical region: Secondary | ICD-10-CM | POA: Diagnosis not present

## 2019-04-29 DIAGNOSIS — G894 Chronic pain syndrome: Secondary | ICD-10-CM | POA: Diagnosis not present

## 2019-04-29 DIAGNOSIS — M1812 Unilateral primary osteoarthritis of first carpometacarpal joint, left hand: Secondary | ICD-10-CM | POA: Diagnosis not present

## 2019-04-29 DIAGNOSIS — M4802 Spinal stenosis, cervical region: Secondary | ICD-10-CM | POA: Diagnosis not present

## 2019-05-02 DIAGNOSIS — G894 Chronic pain syndrome: Secondary | ICD-10-CM | POA: Diagnosis not present

## 2019-05-02 DIAGNOSIS — I10 Essential (primary) hypertension: Secondary | ICD-10-CM | POA: Diagnosis not present

## 2019-05-02 DIAGNOSIS — Z4789 Encounter for other orthopedic aftercare: Secondary | ICD-10-CM | POA: Diagnosis not present

## 2019-05-02 DIAGNOSIS — M5090 Cervical disc disorder, unspecified, unspecified cervical region: Secondary | ICD-10-CM | POA: Diagnosis not present

## 2019-05-02 DIAGNOSIS — M1812 Unilateral primary osteoarthritis of first carpometacarpal joint, left hand: Secondary | ICD-10-CM | POA: Diagnosis not present

## 2019-05-02 DIAGNOSIS — M4722 Other spondylosis with radiculopathy, cervical region: Secondary | ICD-10-CM | POA: Diagnosis not present

## 2019-05-02 DIAGNOSIS — M5030 Other cervical disc degeneration, unspecified cervical region: Secondary | ICD-10-CM | POA: Diagnosis not present

## 2019-05-02 DIAGNOSIS — G2581 Restless legs syndrome: Secondary | ICD-10-CM | POA: Diagnosis not present

## 2019-05-02 DIAGNOSIS — M4802 Spinal stenosis, cervical region: Secondary | ICD-10-CM | POA: Diagnosis not present

## 2019-05-02 DIAGNOSIS — K2270 Barrett's esophagus without dysplasia: Secondary | ICD-10-CM | POA: Diagnosis not present

## 2019-05-02 NOTE — Discharge Summary (Signed)
Patient ID: Alexander [name] MRN: [id number] DOB/AGE: 1950/11/22 68 y.o.  Admit date: 04/19/2019 Discharge date: 05/02/2019  Admission Diagnoses:  Principal Problem:   Herniation of cervical intervertebral disc with radiculopathy Active Problems:   Other spondylosis with radiculopathy, cervical region   Fusion of spine of cervical region   Discharge Diagnoses:  Principal Problem:   Herniation of cervical intervertebral disc with radiculopathy Active Problems:   Other spondylosis with radiculopathy, cervical region   Fusion of spine of cervical region  status post Procedure(s): ANTERIOR CERVICAL DISCECTOMY FUSION cervical three to four, cervical four to five.  Past Medical History:  Diagnosis Date  . Barrett's esophagus 09/30/2013  . Bipolar 1 disorder, mixed ([place]) 09/30/2013   [facility], Dr. Ardine Eng   . BPH (benign prostatic hyperplasia) 09/30/2013  . Bradycardia   . Diverticulitis 2007  . Essential hypertension, benign 09/30/2013  . GERD (gastroesophageal reflux disease)   . Sleep apnea     Surgeries: Procedure(s): ANTERIOR CERVICAL DISCECTOMY FUSION cervical three to four, cervical four to five. on 04/19/2019   Consultants:   Discharged Condition: Improved  Hospital Course: Alexander [name] is an 68 y.o. male who was admitted 04/19/2019 for operative treatment of Herniation of cervical intervertebral disc with radiculopathy. Patient failed conservative treatments (please see the history and physical for the specifics) and had severe unremitting pain that affects sleep, daily activities and work/hobbies. After pre-op clearance, the patient was taken to the operating room on 04/19/2019 and underwent  Procedure(s): ANTERIOR CERVICAL DISCECTOMY FUSION cervical three to four, cervical four to five..    Patient was given perioperative antibiotics:  Anti-infectives (From admission, onward)   Start     Dose/Rate Route Frequency Ordered Stop   04/19/19 1315  ceFAZolin (ANCEF)  IVPB 2g/100 mL premix     2 g 200 mL/hr over 30 Minutes Intravenous Every 8 hours 04/19/19 1313 04/19/19 2118   04/19/19 0600  ceFAZolin (ANCEF) IVPB 2g/100 mL premix     2 g 200 mL/hr over 30 Minutes Intravenous On call to O.R. 04/19/19 [id number] 04/19/19 0757       Patient was given sequential compression devices and early ambulation to prevent DVT.   Patient benefited maximally from hospital stay and there were no complications. At the time of discharge, the patient was urinating/moving their bowels without difficulty, tolerating a regular diet, pain is controlled with oral pain medications and they have been cleared by PT/OT.   Recent vital signs: No data found.   Recent laboratory studies: No results for input(s): WBC, HGB, HCT, PLT, NA, K, CL, CO2, BUN, CREATININE, GLUCOSE, INR, CALCIUM in the last 72 hours.  Invalid input(s): PT, 2   Discharge Medications:   Allergies as of 04/20/2019      Reactions   Effexor [venlafaxine]    Had a bad withdrawal reaction after being off for 20 days      Medication List    STOP taking these medications   HYDROcodone-acetaminophen 5-325 MG tablet Commonly known as: NORCO/VICODIN   promethazine 25 MG tablet Commonly known as: PHENERGAN     TAKE these medications   AMBULATORY NON FORMULARY MEDICATION Increase Auto-PAP machine with heated humidifier settings to 9-20cm H2O.  Dx: Obstructive sleep apnea.   amLODipine 10 MG tablet Commonly known as: NORVASC Take one tablet daily. What changed:   how much to take  how to take this  when to take this  additional instructions   aspirin 81 MG tablet Take 81 mg by mouth  every other day.   atorvastatin 20 MG tablet Commonly known as: LIPITOR Take 1 tablet (20 mg total) by mouth daily.   azelastine 0.1 % nasal spray Commonly known as: ASTELIN Place 2 sprays into both nostrils 2 (two) times daily. Use in each nostril as directed   benazepril 20 MG tablet Commonly known as:  LOTENSIN Take 1 tablet (20 mg total) by mouth daily.   clonazePAM 0.5 MG tablet Commonly known as: KLONOPIN Take 1 tablet (0.5 mg total) by mouth 2 (two) times daily as needed for anxiety.   cyclobenzaprine 10 MG tablet Commonly known as: FLEXERIL Take 1 tablet (10 mg total) by mouth 3 (three) times daily as needed for muscle spasms.   docusate sodium 100 MG capsule Commonly known as: COLACE Take 1 capsule (100 mg total) by mouth 2 (two) times daily.   doxazosin 4 MG tablet Commonly known as: CARDURA Take 1 tablet (4 mg total) by mouth 2 (two) times daily.   Fish Oil 1000 MG Caps One by mouth twice a day with meals.   gabapentin 600 MG tablet Commonly known as: NEURONTIN Two tabs PO TID What changed:   how much to take  how to take this  when to take this  additional instructions   hyoscyamine 0.375 MG 12 hr tablet Commonly known as: LEVBID Take 1 tablet (0.375 mg total) by mouth 2 (two) times daily.   lithium carbonate 450 MG CR tablet Commonly known as: ESKALITH Take 450 mg by mouth 2 (two) times daily.   pantoprazole 40 MG tablet Commonly known as: PROTONIX TAKE 1 TABLET (40 MG TOTAL) BY MOUTH 2 (TWO) TIMES DAILY. What changed:   how much to take  how to take this  when to take this  additional instructions   rOPINIRole 0.25 MG tablet Commonly known as: REQUIP Take 1 tablet (0.25 mg total) by mouth at bedtime. For restless legs.   sertraline 100 MG tablet Commonly known as: ZOLOFT Take 100 mg by mouth daily.   sucralfate 1 g tablet Commonly known as: CARAFATE TAKE 1 TABLET (1 G TOTAL) BY MOUTH 4 (FOUR) TIMES DAILY.     ASK your doctor about these medications   HYDROcodone-acetaminophen 7.5-325 MG tablet Commonly known as: NORCO Take 1-2 tablets by mouth every 6 (six) hours for 7 days. Ask about: Should I take this medication?       Diagnostic Studies: Dg Chest 2 View  Result Date: 04/15/2019 CLINICAL DATA:  Preoperative EXAM: CHEST -  2 VIEW COMPARISON:  08/26/2017 FINDINGS: The heart size and mediastinal contours are within normal limits. Both lungs are clear. Disc degenerative disease of the thoracic spine. IMPRESSION: No acute abnormality of the lungs. Electronically Signed   By: Eddie Candle M.D.   On: 04/15/2019 14:52   Dg Cervical Spine 2-3 Views  Result Date: 04/19/2019 CLINICAL DATA:  Cervical discectomy and fusion. EXAM: CERVICAL SPINE - 2-3 VIEW; DG C-ARM 1-60 MIN COMPARISON:  MRI 12/26/2018. FINDINGS: Cervical spine is difficult to number due to positioning. Prior anterior cervical spine fusion. Hardware intact. Anatomic alignment. IMPRESSION: Anatomic alignment post cervical fusion. Electronically Signed   By: Marcello Moores  Register   On: 04/19/2019 12:18   Dg C-arm 1-60 Min  Result Date: 04/19/2019 CLINICAL DATA:  Cervical discectomy and fusion. EXAM: CERVICAL SPINE - 2-3 VIEW; DG C-ARM 1-60 MIN COMPARISON:  MRI 12/26/2018. FINDINGS: Cervical spine is difficult to number due to positioning. Prior anterior cervical spine fusion. Hardware intact. Anatomic alignment. IMPRESSION: Anatomic alignment  post cervical fusion. Electronically Signed   By: Marcello Moores  Register   On: 04/19/2019 12:18    Discharge Instructions    Call MD / Call 911   Complete by: As directed    If you experience chest pain or shortness of breath, CALL 911 and be transported to the hospital emergency room.  If you develope a fever above 101 F, pus (white drainage) or increased drainage or redness at the wound, or calf pain, call your surgeon's office.   Constipation Prevention   Complete by: As directed    Drink plenty of fluids.  Prune juice may be helpful.  You may use a stool softener, such as Colace (over the counter) 100 mg twice a day.  Use MiraLax (over the counter) for constipation as needed.   Diet - low sodium heart healthy   Complete by: As directed    Discharge instructions   Complete by: As directed    *   No lifting greater than 10 lbs.  No overhead use of arms. Avoid bending,and twisting neck. Walk in house for first week them may start to get out slowly increasing distance up to one mile by 3 weeks post op. Keep incision dry for 3 days, may then bathe and wet incision using a [place] collar when showering. Call if any fevers >101, chills, or increasing numbness or weakness or increased swelling or drainage.   Driving restrictions   Complete by: As directed    No driving for 6 weeks   Increase activity slowly as tolerated   Complete by: As directed    Lifting restrictions   Complete by: As directed    No lifting for 12 weeks      Follow-up Information    Jessy Oto, MD In 2 weeks.   Specialty: Orthopedic Surgery Why: For wound re-check Contact information: [place] [address] [place], [facility] Follow up.   Specialty: [place] Why: home health services arranged Contact information: [address] [place] [address] [phone]           Discharge Plan:  discharge to home  Disposition:     Signed: Benjiman Core for Dr. Basil Dess 05/02/2019, 3:57 PM

## 2019-05-03 ENCOUNTER — Telehealth: Payer: Self-pay | Admitting: Specialist

## 2019-05-03 NOTE — Telephone Encounter (Signed)
Tina with [facility] called wanting to know if there were any specific wound care orders while they were seeing the patient.  CB#[phone].  Thank you

## 2019-05-04 ENCOUNTER — Ambulatory Visit (INDEPENDENT_AMBULATORY_CARE_PROVIDER_SITE_OTHER): Payer: Medicare HMO | Admitting: Surgery

## 2019-05-04 ENCOUNTER — Telehealth: Payer: Self-pay | Admitting: Surgery

## 2019-05-04 ENCOUNTER — Encounter: Payer: Self-pay | Admitting: Surgery

## 2019-05-04 VITALS — BP 120/65 | HR 72 | Ht 70.5 in | Wt 194.0 lb

## 2019-05-04 DIAGNOSIS — R1312 Dysphagia, oropharyngeal phase: Secondary | ICD-10-CM | POA: Diagnosis not present

## 2019-05-04 DIAGNOSIS — Z981 Arthrodesis status: Secondary | ICD-10-CM

## 2019-05-04 NOTE — Telephone Encounter (Signed)
Patient called. Would like to talk to you about medicine. His call back number is [phone]

## 2019-05-04 NOTE — Progress Notes (Signed)
68 year old white male who is two-week status post C3-4 and C4-5 ACDF returns.  He continues to complain of dysphasia.  Still having problems getting pills down along with solid food.  Still no difficulty breathing.  Exam Surgical incision looks good.  No drainage or signs infection.  He continues have some swelling.   Plan We called Dr. Noreene Filbert ENT office today to see about getting patient an appointment.  He will see Dr. Blenda Nicely this afternoon.  Follow-up with Dr. Louanne Skye in 2 weeks for recheck.  Will return sooner if needed.

## 2019-05-04 NOTE — Telephone Encounter (Signed)
Can you please advise on this

## 2019-05-04 NOTE — Progress Notes (Signed)
Post-Op Visit Note   Patient: Alexander [name]           Date of Birth: [birth year]           MRN: [id number] Visit Date: 04/28/2019 PCP: Donella Stade, PA-C   Assessment & Plan:  Chief Complaint:  Chief Complaint  Patient presents with  . Neck - Routine Post Op   Visit Diagnoses:  1. S/P cervical spinal fusion   Patient is 1 week status post C3-4 C4-5 ACDF returns.  States that his arm pain is improved.  He is complaining of dysphasia with some solids and liquids.  Feels like pills are getting stuck and then he coughs them up after 20 minutes.  He has been trying to eat soft foods but still has trouble getting this down.  No difficulty breathing.  Plan: I did review x-rays with Dr. Louanne Skye today and advised him of patient's complaint of dysphasia.  Recommends seeing how patient does over the next few days and he will follow-up with me next week.  If he has not had any improvement of his symptoms or if it worsens over the weekend he was advised to go to the emergency room.  When I see him back I will make referral to Dr. Erik Obey ENT when I see him back if not better.  Follow-Up Instructions: Return in about 6 days (around 05/04/2019) for with L[place]on K[place]ow.   Orders:  Orders Placed This Encounter  Procedures  . XR Cervical Spine 2 or 3 views   No orders of the defined types were placed in this encounter.   Imaging: No results found.  PMFS History: Patient Active Problem List   Diagnosis Date Noted  . Herniation of cervical intervertebral disc with radiculopathy 04/19/2019    Class: Chronic  . Other spondylosis with radiculopathy, cervical region 04/19/2019    Class: Chronic  . Fusion of spine of cervical region 04/19/2019  . Sinus bradycardia 04/04/2019  . NSAID induced gastritis 01/20/2019  . Spinal stenosis of cervical region 11/05/2018  . Chronic pain disorder 11/01/2018  . Cervical radiculopathy 11/01/2018  . Cervical spondylosis 09/15/2018  . Erectile dysfunction  07/15/2018  . Accessory skin tags 04/28/2018  . Osteoarthritis of facet joint of cervical spine 04/26/2018  . DDD (degenerative disc disease), cervical 01/29/2018  . Irregular heart beat 01/27/2018  . LVH (left ventricular hypertrophy) 01/27/2018  . Neck pain 01/27/2018  . Serum calcium elevated 01/27/2018  . Elevated bilirubin 01/27/2018  . Loose stools 01/27/2018  . Low libido 10/08/2017  . Irritable bowel syndrome with diarrhea 10/08/2017  . Vision changes 10/08/2017  . Reactive airway disease that is not asthma 09/13/2017  . Bilateral lower extremity edema 09/13/2017  . Memory changes 08/30/2017  . Elevated fasting glucose 08/30/2017  . Cough 08/30/2017  . Acute non-recurrent pansinusitis 08/12/2017  . Hematoma 04/24/2017  . Excessive bleeding 04/24/2017  . Lipoma of neck 04/24/2017  . Need for influenza vaccination 03/25/2017  . Chronic pain of right knee 03/25/2017  . Epidermal cyst 03/25/2017  . Seborrheic keratoses 03/25/2017  . RLS (restless legs syndrome) 03/25/2017  . Diarrhea 03/25/2017  . Ear mass, left 06/06/2016  . Osteoarthritis of carpometacarpal joint of left thumb 01/05/2015  . OSA on CPAP 12/28/2013  . History of diverticulitis 10/20/2013  . Preventive measure 10/20/2013  . Hyperlipidemia 10/03/2013  . Essential hypertension, benign 09/30/2013  . Barrett's esophagus 09/30/2013  . Impaired fasting glucose 09/30/2013  . BPH (benign prostatic hyperplasia) 09/30/2013  . Bipolar  1 disorder, mixed ([place]) 09/30/2013  . Generalized anxiety disorder 09/30/2013   Past Medical History:  Diagnosis Date  . Barrett's esophagus 09/30/2013  . Bipolar 1 disorder, mixed ([place]) 09/30/2013   [facility], Dr. Ardine Eng   . BPH (benign prostatic hyperplasia) 09/30/2013  . Bradycardia   . Diverticulitis 2007  . Essential hypertension, benign 09/30/2013  . GERD (gastroesophageal reflux disease)   . Sleep apnea     Family History  Problem Relation Age of Onset  . CAD Neg  Hx     Past Surgical History:  Procedure Laterality Date  . ANTERIOR CERVICAL DECOMP/DISCECTOMY FUSION N/A 04/19/2019   Procedure: ANTERIOR CERVICAL DISCECTOMY FUSION cervical three to four, cervical four to five.;  Surgeon: Jessy Oto, MD;  Location: [facility];  Service: Orthopedics;  Laterality: N/A;  . COLOSTOMY REVERSAL    . large bowel perforation     Social History   Occupational History  . Not on file  Tobacco Use  . Smoking status: Former Smoker    Quit date: 10/01/1983    Years since quitting: 35.6  . Smokeless tobacco: Never Used  Substance and Sexual Activity  . Alcohol use: Yes    Alcohol/week: 2.0 - 4.0 standard drinks    Types: 2 - 4 Cans of beer per week    Comment: daily  . Drug use: No  . Sexual activity: Not Currently    Partners: Female   Exam Pleasant white male alert and oriented in no acute distress.  Surgical incision looks good.  New Steri-Strips applied.  Does have some swelling around the incision area but not too bad.

## 2019-05-06 NOTE — Telephone Encounter (Signed)
I called and lmom to advise Otila Kluver

## 2019-05-06 NOTE — Telephone Encounter (Signed)
No.  Just make sure no signs of infection and check swelling

## 2019-05-06 NOTE — Telephone Encounter (Signed)
What is question about medicine?

## 2019-05-06 NOTE — Telephone Encounter (Signed)
I called patient. He was calling regarding possibly getting pain medication for the pain in the back of his head and neck. He states that he only had a ride on the day that he called. He is currently taking hydrocodone and Gabapentin and states that he will do the best he can to make do until his follow up appt.

## 2019-05-09 DIAGNOSIS — M5030 Other cervical disc degeneration, unspecified cervical region: Secondary | ICD-10-CM | POA: Diagnosis not present

## 2019-05-09 DIAGNOSIS — G2581 Restless legs syndrome: Secondary | ICD-10-CM | POA: Diagnosis not present

## 2019-05-09 DIAGNOSIS — M4802 Spinal stenosis, cervical region: Secondary | ICD-10-CM | POA: Diagnosis not present

## 2019-05-09 DIAGNOSIS — M1812 Unilateral primary osteoarthritis of first carpometacarpal joint, left hand: Secondary | ICD-10-CM | POA: Diagnosis not present

## 2019-05-09 DIAGNOSIS — M4722 Other spondylosis with radiculopathy, cervical region: Secondary | ICD-10-CM | POA: Diagnosis not present

## 2019-05-09 DIAGNOSIS — M5090 Cervical disc disorder, unspecified, unspecified cervical region: Secondary | ICD-10-CM | POA: Diagnosis not present

## 2019-05-09 DIAGNOSIS — K2270 Barrett's esophagus without dysplasia: Secondary | ICD-10-CM | POA: Diagnosis not present

## 2019-05-09 DIAGNOSIS — G894 Chronic pain syndrome: Secondary | ICD-10-CM | POA: Diagnosis not present

## 2019-05-09 DIAGNOSIS — I10 Essential (primary) hypertension: Secondary | ICD-10-CM | POA: Diagnosis not present

## 2019-05-09 DIAGNOSIS — Z4789 Encounter for other orthopedic aftercare: Secondary | ICD-10-CM | POA: Diagnosis not present

## 2019-05-09 NOTE — Telephone Encounter (Signed)
The hydrocodone prescribed by Dr. Louanne Skye is for pain.

## 2019-05-09 NOTE — Telephone Encounter (Signed)
Noted. Per message, patient was going to try and make it to follow up appt with what he has.

## 2019-05-10 DIAGNOSIS — G894 Chronic pain syndrome: Secondary | ICD-10-CM | POA: Diagnosis not present

## 2019-05-10 DIAGNOSIS — I10 Essential (primary) hypertension: Secondary | ICD-10-CM | POA: Diagnosis not present

## 2019-05-10 DIAGNOSIS — Z4789 Encounter for other orthopedic aftercare: Secondary | ICD-10-CM | POA: Diagnosis not present

## 2019-05-10 DIAGNOSIS — M4722 Other spondylosis with radiculopathy, cervical region: Secondary | ICD-10-CM | POA: Diagnosis not present

## 2019-05-10 DIAGNOSIS — M5030 Other cervical disc degeneration, unspecified cervical region: Secondary | ICD-10-CM | POA: Diagnosis not present

## 2019-05-10 DIAGNOSIS — M5090 Cervical disc disorder, unspecified, unspecified cervical region: Secondary | ICD-10-CM | POA: Diagnosis not present

## 2019-05-10 DIAGNOSIS — K2270 Barrett's esophagus without dysplasia: Secondary | ICD-10-CM | POA: Diagnosis not present

## 2019-05-10 DIAGNOSIS — G2581 Restless legs syndrome: Secondary | ICD-10-CM | POA: Diagnosis not present

## 2019-05-10 DIAGNOSIS — M1812 Unilateral primary osteoarthritis of first carpometacarpal joint, left hand: Secondary | ICD-10-CM | POA: Diagnosis not present

## 2019-05-10 DIAGNOSIS — M4802 Spinal stenosis, cervical region: Secondary | ICD-10-CM | POA: Diagnosis not present

## 2019-05-19 ENCOUNTER — Other Ambulatory Visit: Payer: Self-pay

## 2019-05-19 ENCOUNTER — Ambulatory Visit (INDEPENDENT_AMBULATORY_CARE_PROVIDER_SITE_OTHER): Payer: Medicare HMO

## 2019-05-19 ENCOUNTER — Ambulatory Visit (INDEPENDENT_AMBULATORY_CARE_PROVIDER_SITE_OTHER): Payer: Medicare HMO | Admitting: Specialist

## 2019-05-19 ENCOUNTER — Encounter: Payer: Self-pay | Admitting: Specialist

## 2019-05-19 VITALS — BP 130/61 | HR 54 | Ht 70.5 in | Wt 194.0 lb

## 2019-05-19 DIAGNOSIS — Z981 Arthrodesis status: Secondary | ICD-10-CM

## 2019-05-19 DIAGNOSIS — M4712 Other spondylosis with myelopathy, cervical region: Secondary | ICD-10-CM

## 2019-05-19 MED ORDER — HYDROCODONE-ACETAMINOPHEN 5-325 MG PO TABS: 1.0000 | ORAL_TABLET | Freq: Four times a day (QID) | ORAL | 0 refills | Status: DC | PRN

## 2019-05-19 MED ORDER — DICLOFENAC SODIUM 1 % TD GEL: 2.0000 g | Freq: Four times a day (QID) | TRANSDERMAL | 3 refills | Status: DC

## 2019-05-19 NOTE — Progress Notes (Signed)
Office Visit Note   Patient: Alexander [name]           Date of Birth: Nov 28, 1950           MRN: [id number] Visit Date: 05/19/2019              Requested by: Donella Stade, PA-C [address] [place],  [place] [address] PCP: Donella Stade, PA-C   Assessment & Plan: 1 month post 2 level ACDF Visit Diagnoses:  1. S/P cervical spinal fusion   Incision is healed some 2 small subcut lumps probably suture of underlying  Plan: Avoid overhead lifting and overhead use of the arms. Do not lift greater than 5 lbs. Adjust head rest in vehicle to prevent hyperextension if rear ended. Take extra precautions to avoid falling.  Follow-Up Instructions: No follow-ups on file.   Orders:  Orders Placed This Encounter  Procedures  . XR Cervical Spine 2 or 3 views   No orders of the defined types were placed in this encounter.     Procedures: No procedures performed   Clinical Data: No additional findings.   Subjective: Chief Complaint  Patient presents with  . Neck - Routine Post Op    HPI  Review of Systems   Objective: Vital Signs: BP 130/61 (BP Location: Left Arm, Patient Position: Sitting)   Pulse (!) 54   Ht 5' 10.5" (1.791 m)   Wt 194 lb (88 kg)   BMI 27.44 kg/m   Physical Exam  Ortho Exam  Specialty Comments:  No specialty comments available.  Imaging: No results found.   PMFS History: Patient Active Problem List   Diagnosis Date Noted  . Herniation of cervical intervertebral disc with radiculopathy 04/19/2019    Priority: High    Class: Chronic  . Other spondylosis with radiculopathy, cervical region 04/19/2019    Priority: High    Class: Chronic  . Fusion of spine of cervical region 04/19/2019  . Sinus bradycardia 04/04/2019  . NSAID induced gastritis 01/20/2019  . Spinal stenosis of cervical region 11/05/2018  . Chronic pain disorder 11/01/2018  . Cervical radiculopathy 11/01/2018  . Cervical spondylosis 09/15/2018  .  Erectile dysfunction 07/15/2018  . Accessory skin tags 04/28/2018  . Osteoarthritis of facet joint of cervical spine 04/26/2018  . DDD (degenerative disc disease), cervical 01/29/2018  . Irregular heart beat 01/27/2018  . LVH (left ventricular hypertrophy) 01/27/2018  . Neck pain 01/27/2018  . Serum calcium elevated 01/27/2018  . Elevated bilirubin 01/27/2018  . Loose stools 01/27/2018  . Low libido 10/08/2017  . Irritable bowel syndrome with diarrhea 10/08/2017  . Vision changes 10/08/2017  . Reactive airway disease that is not asthma 09/13/2017  . Bilateral lower extremity edema 09/13/2017  . Memory changes 08/30/2017  . Elevated fasting glucose 08/30/2017  . Cough 08/30/2017  . Acute non-recurrent pansinusitis 08/12/2017  . Hematoma 04/24/2017  . Excessive bleeding 04/24/2017  . Lipoma of neck 04/24/2017  . Need for influenza vaccination 03/25/2017  . Chronic pain of right knee 03/25/2017  . Epidermal cyst 03/25/2017  . Seborrheic keratoses 03/25/2017  . RLS (restless legs syndrome) 03/25/2017  . Diarrhea 03/25/2017  . Ear mass, left 06/06/2016  . Osteoarthritis of carpometacarpal joint of left thumb 01/05/2015  . OSA on CPAP 12/28/2013  . History of diverticulitis 10/20/2013  . Preventive measure 10/20/2013  . Hyperlipidemia 10/03/2013  . Essential hypertension, benign 09/30/2013  . Barrett's esophagus 09/30/2013  . Impaired fasting glucose 09/30/2013  .  BPH (benign prostatic hyperplasia) 09/30/2013  . Bipolar 1 disorder, mixed ([place]) 09/30/2013  . Generalized anxiety disorder 09/30/2013   Past Medical History:  Diagnosis Date  . Barrett's esophagus 09/30/2013  . Bipolar 1 disorder, mixed ([place]) 09/30/2013   [facility], Dr. Ardine Eng   . BPH (benign prostatic hyperplasia) 09/30/2013  . Bradycardia   . Diverticulitis 2007  . Essential hypertension, benign 09/30/2013  . GERD (gastroesophageal reflux disease)   . Sleep apnea     Family History  Problem Relation Age  of Onset  . CAD Neg Hx     Past Surgical History:  Procedure Laterality Date  . ANTERIOR CERVICAL DECOMP/DISCECTOMY FUSION N/A 04/19/2019   Procedure: ANTERIOR CERVICAL DISCECTOMY FUSION cervical three to four, cervical four to five.;  Surgeon: Jessy Oto, MD;  Location: [facility];  Service: Orthopedics;  Laterality: N/A;  . COLOSTOMY REVERSAL    . large bowel perforation     Social History   Occupational History  . Not on file  Tobacco Use  . Smoking status: Former Smoker    Quit date: 10/01/1983    Years since quitting: 35.6  . Smokeless tobacco: Never Used  Substance and Sexual Activity  . Alcohol use: Yes    Alcohol/week: 2.0 - 4.0 standard drinks    Types: 2 - 4 Cans of beer per week    Comment: daily  . Drug use: No  . Sexual activity: Not Currently    Partners: Female

## 2019-05-19 NOTE — Addendum Note (Signed)
Addended by: Basil Dess on: 05/19/2019 11:14 AM   Modules accepted: Orders

## 2019-05-19 NOTE — Patient Instructions (Signed)
Avoid overhead lifting and overhead use of the arms. Do not lift greater than 5 lbs. Adjust head rest in vehicle to prevent hyperextension if rear ended. Take extra precautions to avoid falling.  

## 2019-05-20 DIAGNOSIS — I10 Essential (primary) hypertension: Secondary | ICD-10-CM | POA: Diagnosis not present

## 2019-05-20 DIAGNOSIS — G894 Chronic pain syndrome: Secondary | ICD-10-CM | POA: Diagnosis not present

## 2019-05-20 DIAGNOSIS — M4802 Spinal stenosis, cervical region: Secondary | ICD-10-CM | POA: Diagnosis not present

## 2019-05-20 DIAGNOSIS — K2270 Barrett's esophagus without dysplasia: Secondary | ICD-10-CM | POA: Diagnosis not present

## 2019-05-20 DIAGNOSIS — M5090 Cervical disc disorder, unspecified, unspecified cervical region: Secondary | ICD-10-CM | POA: Diagnosis not present

## 2019-05-20 DIAGNOSIS — M4722 Other spondylosis with radiculopathy, cervical region: Secondary | ICD-10-CM | POA: Diagnosis not present

## 2019-05-20 DIAGNOSIS — M5030 Other cervical disc degeneration, unspecified cervical region: Secondary | ICD-10-CM | POA: Diagnosis not present

## 2019-05-20 DIAGNOSIS — G2581 Restless legs syndrome: Secondary | ICD-10-CM | POA: Diagnosis not present

## 2019-05-20 DIAGNOSIS — Z4789 Encounter for other orthopedic aftercare: Secondary | ICD-10-CM | POA: Diagnosis not present

## 2019-05-20 DIAGNOSIS — M1812 Unilateral primary osteoarthritis of first carpometacarpal joint, left hand: Secondary | ICD-10-CM | POA: Diagnosis not present

## 2019-06-06 DIAGNOSIS — I10 Essential (primary) hypertension: Secondary | ICD-10-CM | POA: Diagnosis not present

## 2019-06-06 DIAGNOSIS — M1812 Unilateral primary osteoarthritis of first carpometacarpal joint, left hand: Secondary | ICD-10-CM | POA: Diagnosis not present

## 2019-06-06 DIAGNOSIS — G894 Chronic pain syndrome: Secondary | ICD-10-CM | POA: Diagnosis not present

## 2019-06-06 DIAGNOSIS — Z4789 Encounter for other orthopedic aftercare: Secondary | ICD-10-CM | POA: Diagnosis not present

## 2019-06-06 DIAGNOSIS — M5090 Cervical disc disorder, unspecified, unspecified cervical region: Secondary | ICD-10-CM | POA: Diagnosis not present

## 2019-06-06 DIAGNOSIS — K2270 Barrett's esophagus without dysplasia: Secondary | ICD-10-CM | POA: Diagnosis not present

## 2019-06-06 DIAGNOSIS — M5030 Other cervical disc degeneration, unspecified cervical region: Secondary | ICD-10-CM | POA: Diagnosis not present

## 2019-06-06 DIAGNOSIS — M4802 Spinal stenosis, cervical region: Secondary | ICD-10-CM | POA: Diagnosis not present

## 2019-06-06 DIAGNOSIS — G2581 Restless legs syndrome: Secondary | ICD-10-CM | POA: Diagnosis not present

## 2019-06-06 DIAGNOSIS — M4722 Other spondylosis with radiculopathy, cervical region: Secondary | ICD-10-CM | POA: Diagnosis not present

## 2019-06-09 ENCOUNTER — Other Ambulatory Visit: Payer: Self-pay | Admitting: Physician Assistant

## 2019-06-09 DIAGNOSIS — I10 Essential (primary) hypertension: Secondary | ICD-10-CM

## 2019-06-15 ENCOUNTER — Other Ambulatory Visit: Payer: Self-pay

## 2019-06-15 ENCOUNTER — Encounter: Payer: Self-pay | Admitting: Physician Assistant

## 2019-06-15 ENCOUNTER — Ambulatory Visit: Payer: Medicare HMO | Admitting: Physician Assistant

## 2019-06-15 ENCOUNTER — Ambulatory Visit (INDEPENDENT_AMBULATORY_CARE_PROVIDER_SITE_OTHER): Payer: Medicare HMO | Admitting: Physician Assistant

## 2019-06-15 VITALS — BP 134/82 | HR 65 | Ht 70.0 in | Wt 196.0 lb

## 2019-06-15 DIAGNOSIS — R197 Diarrhea, unspecified: Secondary | ICD-10-CM | POA: Diagnosis not present

## 2019-06-15 DIAGNOSIS — K580 Irritable bowel syndrome with diarrhea: Secondary | ICD-10-CM | POA: Diagnosis not present

## 2019-06-15 DIAGNOSIS — I493 Ventricular premature depolarization: Secondary | ICD-10-CM

## 2019-06-15 DIAGNOSIS — K2270 Barrett's esophagus without dysplasia: Secondary | ICD-10-CM | POA: Diagnosis not present

## 2019-06-15 HISTORY — DX: Ventricular premature depolarization: I49.3

## 2019-06-15 MED ORDER — HYOSCYAMINE SULFATE ER 0.375 MG PO TB12: 0.3750 mg | ORAL_TABLET | Freq: Two times a day (BID) | ORAL | 1 refills | Status: DC

## 2019-06-15 MED ORDER — PANTOPRAZOLE SODIUM 40 MG PO TBEC: 40.0000 mg | DELAYED_RELEASE_TABLET | Freq: Two times a day (BID) | ORAL | 1 refills | Status: DC

## 2019-06-15 NOTE — Progress Notes (Signed)
Subjective:    Patient ID: Alexander [name], male    DOB: 01-30-1951, 68 y.o.   MRN: [id number]  HPI  Pt is a 68 yo male with HTN, PVC, OSA, Barretts esophagus, bipolar who presents to the clinic for follow up and to clean up medication list.   Pt recently had cervical fusion and doing really well. Pain has improved. He hopes to continue to get off medications.   He continues to have GI issues of diarrhea after breakfast most days. He does not remember if he tried the levbid. He has tried food changes with no improvement. Last colonoscopy was 2014. Pt has ongoing GERD and on protonix.   [name]. Active Ambulatory Problems    Diagnosis Date Noted  . Essential hypertension, benign 09/30/2013  . Barrett's esophagus 09/30/2013  . Impaired fasting glucose 09/30/2013  . BPH (benign prostatic hyperplasia) 09/30/2013  . Bipolar 1 disorder, mixed ([place]) 09/30/2013  . Generalized anxiety disorder 09/30/2013  . Hyperlipidemia 10/03/2013  . History of diverticulitis 10/20/2013  . Preventive measure 10/20/2013  . OSA on CPAP 12/28/2013  . Osteoarthritis of carpometacarpal joint of left thumb 01/05/2015  . Ear mass, left 06/06/2016  . Chronic pain of right knee 03/25/2017  . Seborrheic keratoses 03/25/2017  . RLS (restless legs syndrome) 03/25/2017  . Diarrhea 03/25/2017  . Hematoma 04/24/2017  . Lipoma of neck 04/24/2017  . Acute non-recurrent pansinusitis 08/12/2017  . Memory changes 08/30/2017  . Elevated fasting glucose 08/30/2017  . Reactive airway disease that is not asthma 09/13/2017  . Bilateral lower extremity edema 09/13/2017  . Low libido 10/08/2017  . Irritable bowel syndrome with diarrhea 10/08/2017  . Irregular heart beat 01/27/2018  . LVH (left ventricular hypertrophy) 01/27/2018  . Neck pain 01/27/2018  . Serum calcium elevated 01/27/2018  . Loose stools 01/27/2018  . DDD (degenerative disc disease), cervical 01/29/2018  . Osteoarthritis of facet joint of cervical spine  04/26/2018  . Accessory skin tags 04/28/2018  . Erectile dysfunction 07/15/2018  . Cervical spondylosis 09/15/2018  . Chronic pain disorder 11/01/2018  . Cervical radiculopathy 11/01/2018  . Spinal stenosis of cervical region 11/05/2018  . NSAID induced gastritis 01/20/2019  . Sinus bradycardia 04/04/2019  . Herniation of cervical intervertebral disc with radiculopathy 04/19/2019  . Other spondylosis with radiculopathy, cervical region 04/19/2019  . Fusion of spine of cervical region 04/19/2019  . PVC (premature ventricular contraction) 06/15/2019   Resolved Ambulatory Problems    Diagnosis Date Noted  . Need for influenza vaccination 03/25/2017  . Epidermal cyst 03/25/2017  . Excessive bleeding 04/24/2017  . Cough 08/30/2017  . Vision changes 10/08/2017  . Elevated bilirubin 01/27/2018   Past Medical History:  Diagnosis Date  . Bradycardia   . Diverticulitis 2007  . GERD (gastroesophageal reflux disease)   . Sleep apnea       Review of Systems See HPI.     Objective:   Physical Exam Vitals signs reviewed.  Constitutional:      Appearance: Normal appearance.  HENT:     Head: Normocephalic.  Cardiovascular:     Rate and Rhythm: Normal rate and regular rhythm.     Pulses: Normal pulses.  Pulmonary:     Effort: Pulmonary effort is normal.     Breath sounds: Normal breath sounds.  Abdominal:     General: Bowel sounds are normal. There is no distension.     Palpations: Abdomen is soft.     Tenderness: There is no abdominal tenderness. There is no  guarding or rebound.  Musculoskeletal:     Comments: In neck brace   Neurological:     General: No focal deficit present.     Mental Status: He is alert and oriented to person, place, and time.  Psychiatric:        Mood and Affect: Mood normal.        Behavior: Behavior normal.           Assessment & Plan:  [name] KitchenMarland KitchenDiagnoses and all orders for this visit:  Irritable bowel syndrome with diarrhea -     Ambulatory  referral to Gastroenterology  Barrett's esophagus without dysplasia -     Ambulatory referral to Gastroenterology -     pantoprazole (PROTONIX) 40 MG tablet; Take 1 tablet (40 mg total) by mouth 2 (two) times daily.  Diarrhea, unspecified type -     Ambulatory referral to Gastroenterology -     hyoscyamine (LEVBID) 0.375 MG 12 hr tablet; Take 1 tablet (0.375 mg total) by mouth 2 (two) times daily.   Will make referral to GI for evaluation. Try levbid bid. With breakfast. Pt is early for colonoscopy. Pt does not have recent endoscopy and with barretts may need.   Updated medication list.

## 2019-06-17 DIAGNOSIS — G4733 Obstructive sleep apnea (adult) (pediatric): Secondary | ICD-10-CM | POA: Diagnosis not present

## 2019-06-20 ENCOUNTER — Encounter: Payer: Self-pay | Admitting: Specialist

## 2019-06-20 ENCOUNTER — Ambulatory Visit (INDEPENDENT_AMBULATORY_CARE_PROVIDER_SITE_OTHER): Payer: Medicare HMO

## 2019-06-20 ENCOUNTER — Ambulatory Visit: Payer: Medicare HMO | Admitting: Specialist

## 2019-06-20 ENCOUNTER — Other Ambulatory Visit: Payer: Self-pay

## 2019-06-20 VITALS — BP 144/77 | HR 51 | Ht 70.0 in | Wt 196.0 lb

## 2019-06-20 DIAGNOSIS — Z981 Arthrodesis status: Secondary | ICD-10-CM | POA: Diagnosis not present

## 2019-06-20 NOTE — Patient Instructions (Addendum)
Avoid overhead lifting and overhead use of the arms. Do not lift greater than 10 lbs. Adjust head rest in vehicle to prevent hyperextension if rear ended. Take extra precautions to avoid falling.. In the future surgery at adjacent levels may be necessary but these levels do not appear to be related to your current symptoms or signs.

## 2019-06-20 NOTE — Progress Notes (Signed)
Post-Op Visit Note   Patient: Alexander [name]           Date of Birth: 08-26-1950           MRN: [id number] Visit Date: 06/20/2019 PCP: Donella Stade, PA-C   Assessment & Plan: 8 weeks post 2 level ACDF  Chief Complaint:  Chief Complaint  Patient presents with  . Neck - Routine Post Op  Incision left neck is healing with some subdermal scar. UE motor is normal. Soft C-collar intact . Visit Diagnoses:  1. S/P cervical spinal fusion     Plan: Avoid overhead lifting and overhead use of the arms. Do not lift greater than 5 lbs. Adjust head rest in vehicle to prevent hyperextension if rear ended. Take extra precautions to avoid falling.. In the future surgery at adjacent levels may be necessary but these levels do not appear to be related to your current symptoms or signs.   Follow-Up Instructions: Return in about 4 weeks (around 07/18/2019).   Orders:  Orders Placed This Encounter  Procedures  . XR Cervical Spine 2 or 3 views   No orders of the defined types were placed in this encounter.   Imaging: Xr Cervical Spine 2 Or 3 Views  Result Date: 06/20/2019 AP and lateral radiographs of the cervical spine shows the C4-5 and C5-6 ACDF in good position and alignment with flexion and extension there is no motion across the posterior elements. The fusion appears to be maturing.    PMFS History: Patient Active Problem List   Diagnosis Date Noted  . Herniation of cervical intervertebral disc with radiculopathy 04/19/2019    Priority: High    Class: Chronic  . Other spondylosis with radiculopathy, cervical region 04/19/2019    Priority: High    Class: Chronic  . PVC (premature ventricular contraction) 06/15/2019  . Fusion of spine of cervical region 04/19/2019  . Sinus bradycardia 04/04/2019  . NSAID induced gastritis 01/20/2019  . Spinal stenosis of cervical region 11/05/2018  . Chronic pain disorder 11/01/2018  . Cervical radiculopathy 11/01/2018  . Cervical  spondylosis 09/15/2018  . Erectile dysfunction 07/15/2018  . Accessory skin tags 04/28/2018  . Osteoarthritis of facet joint of cervical spine 04/26/2018  . DDD (degenerative disc disease), cervical 01/29/2018  . Irregular heart beat 01/27/2018  . LVH (left ventricular hypertrophy) 01/27/2018  . Neck pain 01/27/2018  . Serum calcium elevated 01/27/2018  . Loose stools 01/27/2018  . Low libido 10/08/2017  . Irritable bowel syndrome with diarrhea 10/08/2017  . Reactive airway disease that is not asthma 09/13/2017  . Bilateral lower extremity edema 09/13/2017  . Memory changes 08/30/2017  . Elevated fasting glucose 08/30/2017  . Acute non-recurrent pansinusitis 08/12/2017  . Hematoma 04/24/2017  . Lipoma of neck 04/24/2017  . Chronic pain of right knee 03/25/2017  . Seborrheic keratoses 03/25/2017  . RLS (restless legs syndrome) 03/25/2017  . Diarrhea 03/25/2017  . Ear mass, left 06/06/2016  . Osteoarthritis of carpometacarpal joint of left thumb 01/05/2015  . OSA on CPAP 12/28/2013  . History of diverticulitis 10/20/2013  . Preventive measure 10/20/2013  . Hyperlipidemia 10/03/2013  . Essential hypertension, benign 09/30/2013  . Barrett's esophagus 09/30/2013  . Impaired fasting glucose 09/30/2013  . BPH (benign prostatic hyperplasia) 09/30/2013  . Bipolar 1 disorder, mixed ([place]) 09/30/2013  . Generalized anxiety disorder 09/30/2013   Past Medical History:  Diagnosis Date  . Barrett's esophagus 09/30/2013  . Bipolar 1 disorder, mixed ([place]) 09/30/2013   [facility], Dr.  Ardine Eng   . BPH (benign prostatic hyperplasia) 09/30/2013  . Bradycardia   . Diverticulitis 2007  . Essential hypertension, benign 09/30/2013  . GERD (gastroesophageal reflux disease)   . Sleep apnea     Family History  Problem Relation Age of Onset  . CAD Neg Hx     Past Surgical History:  Procedure Laterality Date  . ANTERIOR CERVICAL DECOMP/DISCECTOMY FUSION N/A 04/19/2019   Procedure: ANTERIOR  CERVICAL DISCECTOMY FUSION cervical three to four, cervical four to five.;  Surgeon: Jessy Oto, MD;  Location: [facility];  Service: Orthopedics;  Laterality: N/A;  . COLOSTOMY REVERSAL    . large bowel perforation     Social History   Occupational History  . Not on file  Tobacco Use  . Smoking status: Former Smoker    Quit date: 10/01/1983    Years since quitting: 35.7  . Smokeless tobacco: Never Used  Substance and Sexual Activity  . Alcohol use: Yes    Alcohol/week: 2.0 - 4.0 standard drinks    Types: 2 - 4 Cans of beer per week    Comment: daily  . Drug use: No  . Sexual activity: Not Currently    Partners: Female

## 2019-07-14 ENCOUNTER — Other Ambulatory Visit: Payer: Self-pay | Admitting: Physician Assistant

## 2019-07-14 DIAGNOSIS — R197 Diarrhea, unspecified: Secondary | ICD-10-CM

## 2019-07-19 ENCOUNTER — Ambulatory Visit: Payer: Medicare HMO | Admitting: Specialist

## 2019-07-19 ENCOUNTER — Encounter: Payer: Self-pay | Admitting: Specialist

## 2019-07-19 ENCOUNTER — Ambulatory Visit: Payer: Self-pay

## 2019-07-19 ENCOUNTER — Other Ambulatory Visit: Payer: Self-pay

## 2019-07-19 VITALS — Ht 70.0 in | Wt 196.0 lb

## 2019-07-19 DIAGNOSIS — Z981 Arthrodesis status: Secondary | ICD-10-CM

## 2019-07-19 MED ORDER — METHOCARBAMOL 500 MG PO TABS: 500.0000 mg | ORAL_TABLET | Freq: Two times a day (BID) | ORAL | 0 refills | Status: DC | PRN

## 2019-07-19 NOTE — Progress Notes (Signed)
68 year old white male who is 71-month status post C3-4 and C4-5 ACDF returns.  States that his preop arm symptoms have resolved.  States that he at times has occipital pain that extends the upper posterior neck.  This at times when he is driving and he turns his head.  Overall he is very pleased with his surgical result.  Exam Very pleasant white male alert and oriented in no acute distress.  Posterior neck is nontender.  No brachial plexus tenderness.  Neurovascular intact.  No focal motor deficits.  Plan Advised patient that the discomfort that he is having may be related to spasms.  I sent in a prescription for Robaxin 500 mg p.o. every 12 hours as needed number 40 tablets no refills.  X-ray reviewed with patient today.  Follow-up in 6 weeks for recheck.  If he still continues to have the same soreness he will let us know and I will plan to call in a Medrol dose pack 6-day taper.

## 2019-07-25 ENCOUNTER — Ambulatory Visit: Payer: Medicare HMO | Admitting: Specialist

## 2019-08-16 DIAGNOSIS — R69 Illness, unspecified: Secondary | ICD-10-CM | POA: Diagnosis not present

## 2019-08-22 ENCOUNTER — Other Ambulatory Visit: Payer: Self-pay | Admitting: Physician Assistant

## 2019-08-22 DIAGNOSIS — R197 Diarrhea, unspecified: Secondary | ICD-10-CM

## 2019-08-22 DIAGNOSIS — Z79899 Other long term (current) drug therapy: Secondary | ICD-10-CM | POA: Diagnosis not present

## 2019-08-31 ENCOUNTER — Other Ambulatory Visit: Payer: Self-pay

## 2019-08-31 ENCOUNTER — Ambulatory Visit: Payer: Medicare HMO | Admitting: Specialist

## 2019-08-31 ENCOUNTER — Encounter: Payer: Self-pay | Admitting: Specialist

## 2019-08-31 ENCOUNTER — Ambulatory Visit (INDEPENDENT_AMBULATORY_CARE_PROVIDER_SITE_OTHER): Payer: Medicare HMO

## 2019-08-31 VITALS — BP 131/75 | HR 56 | Ht 70.0 in | Wt 196.0 lb

## 2019-08-31 DIAGNOSIS — Z981 Arthrodesis status: Secondary | ICD-10-CM | POA: Diagnosis not present

## 2019-08-31 DIAGNOSIS — M47812 Spondylosis without myelopathy or radiculopathy, cervical region: Secondary | ICD-10-CM

## 2019-08-31 MED ORDER — HYDROCODONE-ACETAMINOPHEN 5-325 MG PO TABS: 1.0000 | ORAL_TABLET | Freq: Four times a day (QID) | ORAL | 0 refills | Status: DC | PRN

## 2019-08-31 NOTE — Patient Instructions (Addendum)
Avoid overhead lifting and overhead use of the arms. Do not lift greater than 5 lbs. Adjust head rest in vehicle to prevent hyperextension if rear ended. Take extra precautions to avoid falling, including use of a cane if you feel weak. PT to work of ROM and cervical McKenzie exercises may help the areas in the lower cervical regions.   Hemp CBD capsules, amazon.com 5,000-7,000 mg per bottle, 60 capsules per bottle, take one capsule twice a day.  Follow-Up Instructions: No follow-ups on file.

## 2019-08-31 NOTE — Progress Notes (Signed)
Office Visit Note   Patient: Alexander [name]           Date of Birth: 22-May-1951           MRN: [id number] Visit Date: 08/31/2019              Requested by: Donella Stade, PA-C [address] [place],  [place] [id number] PCP: Donella Stade, PA-C   Assessment & Plan: Visit Diagnoses:  1. S/P cervical spinal fusion   2. Spondylosis without myelopathy or radiculopathy, cervical region     Plan: Avoid overhead lifting and overhead use of the arms. Do not lift greater than 5 lbs. Adjust head rest in vehicle to prevent hyperextension if rear ended. Take extra precautions to avoid falling, including use of a cane if you feel weak. PT to work of ROM and cervical McKenzie exercises may help the areas in the lower cervical regions.   Hemp CBD capsules, amazon.com 5,000-7,000 mg per bottle, 60 capsules per bottle, take one capsule twice a day.  Follow-Up Instructions: Return in about 4 weeks (around 09/28/2019).   Orders:  Orders Placed This Encounter  Procedures  . XR Cervical Spine 2 or 3 views   No orders of the defined types were placed in this encounter.     Procedures: No procedures performed   Clinical Data: No additional findings.   Subjective: Chief Complaint  Patient presents with  . Neck - Follow-up    HPI  Review of Systems   Objective: Vital Signs: BP 131/75 (BP Location: Left Arm, Patient Position: Sitting)   Pulse (!) 56   Ht 5\' 10"  (1.778 m)   Wt 196 lb (88.9 kg)   BMI 28.12 kg/m   Physical Exam  Ortho Exam  Specialty Comments:  No specialty comments available.  Imaging: XR Cervical Spine 2 or 3 views  Result Date: 08/31/2019 AP and lateral flexion and extension radiographs show ACDF C3-4 and C4-5 with plate and screws in good position and alignment. ST anterior to the cervical spine is not showing swelling. There is no motion over the posterior elements with flexion and extension suggesting that the fusion is  maturing. DDD with anterior spurs and disc narrowing C5-6 and C6-7.     PMFS History: Patient Active Problem List   Diagnosis Date Noted  . Herniation of cervical intervertebral disc with radiculopathy 04/19/2019    Priority: High    Class: Chronic  . Other spondylosis with radiculopathy, cervical region 04/19/2019    Priority: High    Class: Chronic  . PVC (premature ventricular contraction) 06/15/2019  . Fusion of spine of cervical region 04/19/2019  . Sinus bradycardia 04/04/2019  . NSAID induced gastritis 01/20/2019  . Spinal stenosis of cervical region 11/05/2018  . Chronic pain disorder 11/01/2018  . Cervical radiculopathy 11/01/2018  . Cervical spondylosis 09/15/2018  . Erectile dysfunction 07/15/2018  . Accessory skin tags 04/28/2018  . Osteoarthritis of facet joint of cervical spine 04/26/2018  . DDD (degenerative disc disease), cervical 01/29/2018  . Irregular heart beat 01/27/2018  . LVH (left ventricular hypertrophy) 01/27/2018  . Neck pain 01/27/2018  . Serum calcium elevated 01/27/2018  . Loose stools 01/27/2018  . Low libido 10/08/2017  . Irritable bowel syndrome with diarrhea 10/08/2017  . Reactive airway disease that is not asthma 09/13/2017  . Bilateral lower extremity edema 09/13/2017  . Memory changes 08/30/2017  . Elevated fasting glucose 08/30/2017  . Acute non-recurrent pansinusitis 08/12/2017  . Hematoma  04/24/2017  . Lipoma of neck 04/24/2017  . Chronic pain of right knee 03/25/2017  . Seborrheic keratoses 03/25/2017  . RLS (restless legs syndrome) 03/25/2017  . Diarrhea 03/25/2017  . Ear mass, left 06/06/2016  . Osteoarthritis of carpometacarpal joint of left thumb 01/05/2015  . OSA on CPAP 12/28/2013  . History of diverticulitis 10/20/2013  . Preventive measure 10/20/2013  . Hyperlipidemia 10/03/2013  . Essential hypertension, benign 09/30/2013  . Barrett's esophagus 09/30/2013  . Impaired fasting glucose 09/30/2013  . BPH (benign  prostatic hyperplasia) 09/30/2013  . Bipolar 1 disorder, mixed ([place]) 09/30/2013  . Generalized anxiety disorder 09/30/2013   Past Medical History:  Diagnosis Date  . Barrett's esophagus 09/30/2013  . Bipolar 1 disorder, mixed ([place]) 09/30/2013   [facility], Dr. Ardine Eng   . BPH (benign prostatic hyperplasia) 09/30/2013  . Bradycardia   . Diverticulitis 2007  . Essential hypertension, benign 09/30/2013  . GERD (gastroesophageal reflux disease)   . Sleep apnea     Family History  Problem Relation Age of Onset  . CAD Neg Hx     Past Surgical History:  Procedure Laterality Date  . ANTERIOR CERVICAL DECOMP/DISCECTOMY FUSION N/A 04/19/2019   Procedure: ANTERIOR CERVICAL DISCECTOMY FUSION cervical three to four, cervical four to five.;  Surgeon: Jessy Oto, MD;  Location: [facility];  Service: Orthopedics;  Laterality: N/A;  . COLOSTOMY REVERSAL    . large bowel perforation     Social History   Occupational History  . Not on file  Tobacco Use  . Smoking status: Former Smoker    Quit date: 10/01/1983    Years since quitting: 35.9  . Smokeless tobacco: Never Used  Substance and Sexual Activity  . Alcohol use: Yes    Alcohol/week: 2.0 - 4.0 standard drinks    Types: 2 - 4 Cans of beer per week    Comment: daily  . Drug use: No  . Sexual activity: Not Currently    Partners: Female

## 2019-09-02 ENCOUNTER — Ambulatory Visit: Payer: Medicare HMO | Admitting: Physical Therapy

## 2019-09-06 DIAGNOSIS — M6281 Muscle weakness (generalized): Secondary | ICD-10-CM | POA: Diagnosis not present

## 2019-09-06 DIAGNOSIS — M542 Cervicalgia: Secondary | ICD-10-CM | POA: Diagnosis not present

## 2019-09-08 DIAGNOSIS — M542 Cervicalgia: Secondary | ICD-10-CM | POA: Diagnosis not present

## 2019-09-08 DIAGNOSIS — M6281 Muscle weakness (generalized): Secondary | ICD-10-CM | POA: Diagnosis not present
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
## 2019-09-13 DIAGNOSIS — M542 Cervicalgia: Secondary | ICD-10-CM | POA: Diagnosis not present

## 2019-09-13 DIAGNOSIS — M6281 Muscle weakness (generalized): Secondary | ICD-10-CM | POA: Diagnosis not present

## 2019-09-15 DIAGNOSIS — M542 Cervicalgia: Secondary | ICD-10-CM | POA: Diagnosis not present

## 2019-09-15 DIAGNOSIS — M6281 Muscle weakness (generalized): Secondary | ICD-10-CM | POA: Diagnosis not present

## 2019-09-16 ENCOUNTER — Other Ambulatory Visit: Payer: Self-pay | Admitting: Physician Assistant

## 2019-09-16 DIAGNOSIS — G2581 Restless legs syndrome: Secondary | ICD-10-CM

## 2019-09-16 DIAGNOSIS — I10 Essential (primary) hypertension: Secondary | ICD-10-CM

## 2019-09-17 ENCOUNTER — Other Ambulatory Visit: Payer: Self-pay | Admitting: Physician Assistant

## 2019-09-17 DIAGNOSIS — I10 Essential (primary) hypertension: Secondary | ICD-10-CM

## 2019-09-20 DIAGNOSIS — M542 Cervicalgia: Secondary | ICD-10-CM | POA: Diagnosis not present

## 2019-09-20 DIAGNOSIS — M6281 Muscle weakness (generalized): Secondary | ICD-10-CM | POA: Diagnosis not present

## 2019-09-22 ENCOUNTER — Other Ambulatory Visit: Payer: Self-pay | Admitting: Physician Assistant

## 2019-09-22 DIAGNOSIS — R197 Diarrhea, unspecified: Secondary | ICD-10-CM

## 2019-09-27 DIAGNOSIS — M542 Cervicalgia: Secondary | ICD-10-CM | POA: Diagnosis not present

## 2019-09-27 DIAGNOSIS — M6281 Muscle weakness (generalized): Secondary | ICD-10-CM | POA: Diagnosis not present

## 2019-09-29 DIAGNOSIS — M6281 Muscle weakness (generalized): Secondary | ICD-10-CM | POA: Diagnosis not present

## 2019-09-29 DIAGNOSIS — M542 Cervicalgia: Secondary | ICD-10-CM | POA: Diagnosis not present

## 2019-10-04 ENCOUNTER — Other Ambulatory Visit: Payer: Self-pay | Admitting: Physician Assistant

## 2019-10-04 DIAGNOSIS — M6281 Muscle weakness (generalized): Secondary | ICD-10-CM | POA: Diagnosis not present

## 2019-10-04 DIAGNOSIS — R197 Diarrhea, unspecified: Secondary | ICD-10-CM

## 2019-10-04 DIAGNOSIS — M542 Cervicalgia: Secondary | ICD-10-CM | POA: Diagnosis not present

## 2019-10-05 ENCOUNTER — Other Ambulatory Visit: Payer: Self-pay | Admitting: Physician Assistant

## 2019-10-05 DIAGNOSIS — R197 Diarrhea, unspecified: Secondary | ICD-10-CM

## 2019-10-06 ENCOUNTER — Other Ambulatory Visit: Payer: Self-pay | Admitting: Physician Assistant

## 2019-10-06 DIAGNOSIS — M542 Cervicalgia: Secondary | ICD-10-CM | POA: Diagnosis not present

## 2019-10-06 DIAGNOSIS — R197 Diarrhea, unspecified: Secondary | ICD-10-CM

## 2019-10-06 DIAGNOSIS — M6281 Muscle weakness (generalized): Secondary | ICD-10-CM | POA: Diagnosis not present

## 2019-10-08 ENCOUNTER — Other Ambulatory Visit: Payer: Self-pay | Admitting: Physician Assistant

## 2019-10-08 DIAGNOSIS — R197 Diarrhea, unspecified: Secondary | ICD-10-CM

## 2019-10-11 DIAGNOSIS — M6281 Muscle weakness (generalized): Secondary | ICD-10-CM | POA: Diagnosis not present

## 2019-10-11 DIAGNOSIS — M542 Cervicalgia: Secondary | ICD-10-CM | POA: Diagnosis not present

## 2019-10-12 ENCOUNTER — Ambulatory Visit: Payer: Medicare HMO | Admitting: Specialist

## 2019-10-12 ENCOUNTER — Ambulatory Visit: Payer: Self-pay

## 2019-10-12 ENCOUNTER — Other Ambulatory Visit: Payer: Self-pay

## 2019-10-12 ENCOUNTER — Encounter: Payer: Self-pay | Admitting: Specialist

## 2019-10-12 VITALS — BP 139/65 | HR 64 | Ht 70.0 in | Wt 196.0 lb

## 2019-10-12 DIAGNOSIS — Z981 Arthrodesis status: Secondary | ICD-10-CM | POA: Diagnosis not present

## 2019-10-12 DIAGNOSIS — M19042 Primary osteoarthritis, left hand: Secondary | ICD-10-CM

## 2019-10-12 DIAGNOSIS — M25542 Pain in joints of left hand: Secondary | ICD-10-CM

## 2019-10-12 DIAGNOSIS — M1812 Unilateral primary osteoarthritis of first carpometacarpal joint, left hand: Secondary | ICD-10-CM

## 2019-10-12 DIAGNOSIS — M960 Pseudarthrosis after fusion or arthrodesis: Secondary | ICD-10-CM | POA: Diagnosis not present
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
## 2019-10-12 NOTE — Patient Instructions (Addendum)
Avoid overhead lifting and overhead use of the arms. Do not lift greater than 5 lbs. Adjust head rest in vehicle to prevent hyperextension if rear ended. Take extra precautions to avoid falling. Stop PT at this point as we should reduce stretching and ROM of the Neck to continue to promote healing. VItamin D  2,000 IU per day supplements. 1500 mg per day in diet, 200mg  per serving of daily. .  Will try to gain approval for a external bone healing stimulator.

## 2019-11-06 ENCOUNTER — Other Ambulatory Visit: Payer: Self-pay | Admitting: Physician Assistant

## 2019-11-06 DIAGNOSIS — E782 Mixed hyperlipidemia: Secondary | ICD-10-CM

## 2019-11-24 ENCOUNTER — Telehealth: Payer: Self-pay | Admitting: Nurse Practitioner

## 2019-11-24 DIAGNOSIS — Z0000 Encounter for general adult medical examination without abnormal findings: Secondary | ICD-10-CM

## 2019-11-24 NOTE — Telephone Encounter (Signed)
PT needs blood work done. Coming in on 5/26 for his 6 month follow up. Wants to get the bloodwork done on the week of may 9th.  Please advise.

## 2019-11-28 NOTE — Telephone Encounter (Signed)
Orders placed.  Patient is aware.   

## 2019-11-28 NOTE — Addendum Note (Signed)
Addended by: Narda Rutherford on: 11/28/2019 02:50 PM   Modules accepted: Orders

## 2019-12-04 ENCOUNTER — Other Ambulatory Visit: Payer: Self-pay | Admitting: Physician Assistant

## 2019-12-04 DIAGNOSIS — E782 Mixed hyperlipidemia: Secondary | ICD-10-CM

## 2019-12-14 ENCOUNTER — Ambulatory Visit: Payer: Medicare HMO | Admitting: Physician Assistant

## 2019-12-15 ENCOUNTER — Encounter: Payer: Self-pay | Admitting: Specialist

## 2019-12-15 ENCOUNTER — Ambulatory Visit (INDEPENDENT_AMBULATORY_CARE_PROVIDER_SITE_OTHER): Payer: Medicare HMO

## 2019-12-15 ENCOUNTER — Other Ambulatory Visit: Payer: Self-pay

## 2019-12-15 ENCOUNTER — Ambulatory Visit: Payer: Medicare HMO | Admitting: Specialist

## 2019-12-15 VITALS — BP 136/73 | HR 60 | Ht 70.5 in | Wt 194.0 lb

## 2019-12-15 DIAGNOSIS — M1812 Unilateral primary osteoarthritis of first carpometacarpal joint, left hand: Secondary | ICD-10-CM

## 2019-12-15 DIAGNOSIS — M25542 Pain in joints of left hand: Secondary | ICD-10-CM

## 2019-12-15 DIAGNOSIS — M19042 Primary osteoarthritis, left hand: Secondary | ICD-10-CM | POA: Diagnosis not present

## 2019-12-15 DIAGNOSIS — M47812 Spondylosis without myelopathy or radiculopathy, cervical region: Secondary | ICD-10-CM

## 2019-12-15 DIAGNOSIS — Z981 Arthrodesis status: Secondary | ICD-10-CM

## 2019-12-15 DIAGNOSIS — M2021 Hallux rigidus, right foot: Secondary | ICD-10-CM | POA: Diagnosis not present

## 2019-12-15 NOTE — Progress Notes (Signed)
Office Visit Note   Patient: Alexander [name]           Date of Birth: Jun 14, 1951           MRN: [id number] Visit Date: 12/15/2019              Requested by: Donella Stade, PA-C [address] [place],  [place] [id number] PCP: Donella Stade, PA-C   Assessment & Plan: Visit Diagnoses:  1. S/P cervical spinal fusion   2. Hallux rigidus, right foot   3. Cervical spondylosis   4. Pain in thumb joint with movement of left hand   5. Osteoarthritis of carpometacarpal (CMC) joint of left thumb, unspecified osteoarthritis type   6. Degenerative arthritis of metacarpophalangeal joint of left thumb     Plan:Avoid overhead lifting and overhead use of the arms. Do not lift greater than 5 lbs. Adjust head rest in vehicle to prevent hyperextension if rear ended. Take extra precautions to avoid falling. Hallux ridgidus is usually improved with NSAIDs  Like motrin, naproxyn or alleve. Local transdermal voltaren gel. Warm soaks, water massage. Stiff lasted shoes and a Hallux Ridgidus shoe orthosis.  Heel cord stretching.   Follow-Up Instructions: No follow-ups on file.   Orders:  Orders Placed This Encounter  Procedures  . XR Cervical Spine 2 or 3 views   No orders of the defined types were placed in this encounter.     Procedures: No procedures performed   Clinical Data: No additional findings.   Subjective: No chief complaint on file.   69 year old male with history of arthritis of the hands and previous cervical fusion C3-4 and C4-5. He has some    Review of Systems  Constitutional: Negative.   HENT: Negative.   Eyes: Negative.   Respiratory: Negative.   Cardiovascular: Negative.   Gastrointestinal: Negative.   Endocrine: Negative.   Genitourinary: Negative.   Musculoskeletal: Negative.   Skin: Negative.   Allergic/Immunologic: Negative.   Neurological: Negative.   Hematological: Negative.   Psychiatric/Behavioral: Negative.       Objective: Vital Signs: BP 136/73 (BP Location: Left Arm, Patient Position: Sitting)   Pulse 60   Ht 5' 10.5" (1.791 m)   Wt 194 lb (88 kg)   BMI 27.44 kg/m   Physical Exam Constitutional:      Appearance: He is well-developed.  HENT:     Head: Normocephalic and atraumatic.  Eyes:     Pupils: Pupils are equal, round, and reactive to light.  Pulmonary:     Effort: Pulmonary effort is normal.     Breath sounds: Normal breath sounds.  Abdominal:     General: Bowel sounds are normal.     Palpations: Abdomen is soft.  Musculoskeletal:        General: Normal range of motion.     Cervical back: Normal range of motion and neck supple.  Skin:    General: Skin is warm and dry.  Neurological:     Mental Status: He is alert and oriented to person, place, and time.  Psychiatric:        Behavior: Behavior normal.        Thought Content: Thought content normal.        Judgment: Judgment normal.     Ortho Exam  Specialty Comments:  No specialty comments available.  Imaging: XR Cervical Spine 2 or 3 views  Result Date: 12/15/2019 AP and lateral flexion and extension radiographs show the ACDFC3-4  and C4-5 with very little suggestion of motion, similar points measured in flexion and extension show 1.5 mm of movement and this is less than previously observed. Suggests he is going on to heal his 2 level cervical fusion.     PMFS History: Patient Active Problem List   Diagnosis Date Noted  . Herniation of cervical intervertebral disc with radiculopathy 04/19/2019    Priority: High    Class: Chronic  . Other spondylosis with radiculopathy, cervical region 04/19/2019    Priority: High    Class: Chronic  . PVC (premature ventricular contraction) 06/15/2019  . Fusion of spine of cervical region 04/19/2019  . Sinus bradycardia 04/04/2019  . NSAID induced gastritis 01/20/2019  . Spinal stenosis of cervical region 11/05/2018  . Chronic pain disorder 11/01/2018  . Cervical  radiculopathy 11/01/2018  . Cervical spondylosis 09/15/2018  . Erectile dysfunction 07/15/2018  . Accessory skin tags 04/28/2018  . Osteoarthritis of facet joint of cervical spine 04/26/2018  . DDD (degenerative disc disease), cervical 01/29/2018  . Irregular heart beat 01/27/2018  . LVH (left ventricular hypertrophy) 01/27/2018  . Neck pain 01/27/2018  . Serum calcium elevated 01/27/2018  . Loose stools 01/27/2018  . Low libido 10/08/2017  . Irritable bowel syndrome with diarrhea 10/08/2017  . Reactive airway disease that is not asthma 09/13/2017  . Bilateral lower extremity edema 09/13/2017  . Memory changes 08/30/2017  . Elevated fasting glucose 08/30/2017  . Acute non-recurrent pansinusitis 08/12/2017  . Hematoma 04/24/2017  . Lipoma of neck 04/24/2017  . Chronic pain of right knee 03/25/2017  . Seborrheic keratoses 03/25/2017  . RLS (restless legs syndrome) 03/25/2017  . Diarrhea 03/25/2017  . Ear mass, left 06/06/2016  . Osteoarthritis of carpometacarpal joint of left thumb 01/05/2015  . OSA on CPAP 12/28/2013  . History of diverticulitis 10/20/2013  . Preventive measure 10/20/2013  . Hyperlipidemia 10/03/2013  . Essential hypertension, benign 09/30/2013  . Barrett's esophagus 09/30/2013  . Impaired fasting glucose 09/30/2013  . BPH (benign prostatic hyperplasia) 09/30/2013  . Bipolar 1 disorder, mixed ([place]) 09/30/2013  . Generalized anxiety disorder 09/30/2013   Past Medical History:  Diagnosis Date  . Barrett's esophagus 09/30/2013  . Bipolar 1 disorder, mixed ([place]) 09/30/2013   [facility], Dr. Ardine Eng   . BPH (benign prostatic hyperplasia) 09/30/2013  . Bradycardia   . Diverticulitis 2007  . Essential hypertension, benign 09/30/2013  . GERD (gastroesophageal reflux disease)   . Sleep apnea     Family History  Problem Relation Age of Onset  . CAD Neg Hx     Past Surgical History:  Procedure Laterality Date  . ANTERIOR CERVICAL DECOMP/DISCECTOMY FUSION N/A  04/19/2019   Procedure: ANTERIOR CERVICAL DISCECTOMY FUSION cervical three to four, cervical four to five.;  Surgeon: Jessy Oto, MD;  Location: [facility];  Service: Orthopedics;  Laterality: N/A;  . COLOSTOMY REVERSAL    . large bowel perforation     Social History   Occupational History  . Not on file  Tobacco Use  . Smoking status: Former Smoker    Quit date: 10/01/1983    Years since quitting: 36.2  . Smokeless tobacco: Never Used  Substance and Sexual Activity  . Alcohol use: Yes    Alcohol/week: 2.0 - 4.0 standard drinks    Types: 2 - 4 Cans of beer per week    Comment: daily  . Drug use: No  . Sexual activity: Not Currently    Partners: Female

## 2019-12-15 NOTE — Patient Instructions (Signed)
Avoid overhead lifting and overhead use of the arms. Do not lift greater than 5 lbs. Adjust head rest in vehicle to prevent hyperextension if rear ended. Take extra precautions to avoid falling. Hallux ridgidus is usually improved with NSAIDs  Like motrin, naproxyn or alleve. Local transdermal voltaren gel. Warm soaks, water massage. Stiff lasted shoes and a Hallux Ridgidus shoe orthosis.  Heel cord stretching.

## 2019-12-16 DIAGNOSIS — Z0000 Encounter for general adult medical examination without abnormal findings: Secondary | ICD-10-CM | POA: Diagnosis not present

## 2019-12-16 DIAGNOSIS — R7309 Other abnormal glucose: Secondary | ICD-10-CM | POA: Diagnosis not present

## 2019-12-16 DIAGNOSIS — I10 Essential (primary) hypertension: Secondary | ICD-10-CM | POA: Diagnosis not present

## 2019-12-16 DIAGNOSIS — E785 Hyperlipidemia, unspecified: Secondary | ICD-10-CM | POA: Diagnosis not present

## 2019-12-16 DIAGNOSIS — R899 Unspecified abnormal finding in specimens from other organs, systems and tissues: Secondary | ICD-10-CM | POA: Diagnosis not present

## 2019-12-19 ENCOUNTER — Other Ambulatory Visit: Payer: Self-pay | Admitting: Physician Assistant

## 2019-12-19 DIAGNOSIS — E782 Mixed hyperlipidemia: Secondary | ICD-10-CM

## 2019-12-19 NOTE — Telephone Encounter (Signed)
Call pt:  Cholesterol looks great. TG better. CBC looks good.  Calcium a little elevated. Are you taking calicum? Are you taking vitamin D?  Can we add vitamin D and PTH to labs?

## 2019-12-22 ENCOUNTER — Encounter: Payer: Self-pay | Admitting: Neurology

## 2019-12-25 LAB — CBC WITH DIFFERENTIAL/PLATELET
Absolute Monocytes: 585 cells/uL (ref 200–950)
Basophils Absolute: 59 cells/uL (ref 0–200)
Basophils Relative: 0.8 %
Eosinophils Absolute: 244 cells/uL (ref 15–500)
Eosinophils Relative: 3.3 %
HCT: 40 % (ref 38.5–50.0)
Hemoglobin: 13.8 g/dL (ref 13.2–17.1)
Lymphs Abs: 1554 cells/uL (ref 850–3900)
MCH: 32.8 pg (ref 27.0–33.0)
MCHC: 34.5 g/dL (ref 32.0–36.0)
MCV: 95 fL (ref 80.0–100.0)
MPV: 11.4 fL (ref 7.5–12.5)
Monocytes Relative: 7.9 %
Neutro Abs: 4958 cells/uL (ref 1500–7800)
Neutrophils Relative %: 67 %
Platelets: 203 10*3/uL (ref 140–400)
RBC: 4.21 10*6/uL (ref 4.20–5.80)
RDW: 12.3 % (ref 11.0–15.0)
Total Lymphocyte: 21 %
WBC: 7.4 10*3/uL (ref 3.8–10.8)

## 2019-12-25 LAB — LIPID PANEL W/REFLEX DIRECT LDL
Cholesterol: 140 mg/dL (ref <200)
HDL: 59 mg/dL (ref >=40)
LDL Cholesterol (Calc): 58 mg/dL (calc)
Non-HDL Cholesterol (Calc): 81 mg/dL (calc) (ref <130)
Total CHOL/HDL Ratio: 2.4 (calc) (ref <5.0)
Triglycerides: 148 mg/dL (ref <150)

## 2019-12-25 LAB — VITAMIN D 1,25 DIHYDROXY
Vitamin D 1, 25 (OH)2 Total: 76 pg/mL — ABNORMAL HIGH (ref 18–72)
Vitamin D2 1, 25 (OH)2: <8 pg/mL
Vitamin D3 1, 25 (OH)2: 76 pg/mL

## 2019-12-25 LAB — COMPLETE METABOLIC PANEL WITH GFR
AG Ratio: 2.1 (calc) (ref 1.0–2.5)
ALT: 30 U/L (ref 9–46)
AST: 29 U/L (ref 10–35)
Albumin: 4.5 g/dL (ref 3.6–5.1)
Alkaline phosphatase (APISO): 100 U/L (ref 35–144)
BUN: 12 mg/dL (ref 7–25)
CO2: 28 mmol/L (ref 20–32)
Calcium: 10.4 mg/dL — ABNORMAL HIGH (ref 8.6–10.3)
Chloride: 105 mmol/L (ref 98–110)
Creat: 0.89 mg/dL (ref 0.70–1.25)
GFR, Est African American: 101 mL/min/{1.73_m2} (ref >=60)
GFR, Est Non African American: 87 mL/min/{1.73_m2} (ref >=60)
Globulin: 2.1 g/dL (calc) (ref 1.9–3.7)
Glucose, Bld: 116 mg/dL — ABNORMAL HIGH (ref 65–99)
Potassium: 4.2 mmol/L (ref 3.5–5.3)
Sodium: 137 mmol/L (ref 135–146)
Total Bilirubin: 1.2 mg/dL (ref 0.2–1.2)
Total Protein: 6.6 g/dL (ref 6.1–8.1)

## 2019-12-26 NOTE — Telephone Encounter (Signed)
Vitamin D is actually a little too high. How much vitamin D are you taking a day?  I would like for patient to come back and get a PTH/Calcium.

## 2019-12-28 ENCOUNTER — Encounter: Payer: Self-pay | Admitting: Physician Assistant

## 2019-12-28 ENCOUNTER — Ambulatory Visit (INDEPENDENT_AMBULATORY_CARE_PROVIDER_SITE_OTHER): Payer: Medicare HMO | Admitting: Physician Assistant

## 2019-12-28 ENCOUNTER — Other Ambulatory Visit: Payer: Self-pay

## 2019-12-28 ENCOUNTER — Other Ambulatory Visit: Payer: Self-pay | Admitting: Physician Assistant

## 2019-12-28 DIAGNOSIS — G250 Essential tremor: Secondary | ICD-10-CM | POA: Diagnosis not present

## 2019-12-28 DIAGNOSIS — I10 Essential (primary) hypertension: Secondary | ICD-10-CM

## 2019-12-28 DIAGNOSIS — R413 Other amnesia: Secondary | ICD-10-CM

## 2019-12-28 DIAGNOSIS — R197 Diarrhea, unspecified: Secondary | ICD-10-CM

## 2019-12-28 DIAGNOSIS — Z981 Arthrodesis status: Secondary | ICD-10-CM

## 2019-12-28 DIAGNOSIS — M2550 Pain in unspecified joint: Secondary | ICD-10-CM

## 2019-12-28 DIAGNOSIS — E782 Mixed hyperlipidemia: Secondary | ICD-10-CM

## 2019-12-28 MED ORDER — METHOCARBAMOL 500 MG PO TABS: 500.0000 mg | ORAL_TABLET | Freq: Three times a day (TID) | ORAL | 1 refills | Status: DC | PRN

## 2019-12-28 MED ORDER — ATORVASTATIN CALCIUM 20 MG PO TABS: 20.0000 mg | ORAL_TABLET | Freq: Every day | ORAL | 3 refills | Status: DC

## 2019-12-28 MED ORDER — CELECOXIB 100 MG PO CAPS: 100.0000 mg | ORAL_CAPSULE | Freq: Two times a day (BID) | ORAL | 2 refills | Status: DC

## 2019-12-28 MED ORDER — AMLODIPINE BESYLATE 10 MG PO TABS: 10.0000 mg | ORAL_TABLET | Freq: Every day | ORAL | 3 refills | Status: DC

## 2019-12-28 NOTE — Patient Instructions (Signed)

## 2019-12-28 NOTE — Progress Notes (Signed)
Subjective:    Patient ID: Alexander [name], male    DOB: [birth year], 69 y.o.   MRN: [id number]  HPI  Pt is a 69 yo male with s/p cervical fusion, chronic pain, arthralgia, HTN, Bipolar, HLD who presents to the clinic for follow up.   He is doing well after spinal fusion. A lot of his pain has resolved but still has limited ROm of his left arm and dull aching.   He does complain of just aching in a lot of his joints knees, toes, hands, thumbs. He does not feel like mobic is working.   He had labs drawn. Calcium was up some. He had PTH back in 2019 which was normal but we were not able to add to these labs.   He notices that he is having more trouble remembering things. He is concerned. He has also noted a tremor of his hands for the past few ,months. Worse when trying to write and use fine motor skills.     .. Active Ambulatory Problems    Diagnosis Date Noted  . Essential hypertension, benign 09/30/2013  . Barrett's esophagus 09/30/2013  . Impaired fasting glucose 09/30/2013  . BPH (benign prostatic hyperplasia) 09/30/2013  . Bipolar 1 disorder, mixed ([place]) 09/30/2013  . Generalized anxiety disorder 09/30/2013  . Hyperlipidemia 10/03/2013  . History of diverticulitis 10/20/2013  . Preventive measure 10/20/2013  . OSA on CPAP 12/28/2013  . Osteoarthritis of carpometacarpal joint of left thumb 01/05/2015  . Ear mass, left 06/06/2016  . Chronic pain of right knee 03/25/2017  . Seborrheic keratoses 03/25/2017  . RLS (restless legs syndrome) 03/25/2017  . Diarrhea 03/25/2017  . Hematoma 04/24/2017  . Lipoma of neck 04/24/2017  . Acute non-recurrent pansinusitis 08/12/2017  . Memory changes 08/30/2017  . Elevated fasting glucose 08/30/2017  . Reactive airway disease that is not asthma 09/13/2017  . Bilateral lower extremity edema 09/13/2017  . Low libido 10/08/2017  . Irritable bowel syndrome with diarrhea 10/08/2017  . Irregular heart beat 01/27/2018  . LVH (left  ventricular hypertrophy) 01/27/2018  . Neck pain 01/27/2018  . Serum calcium elevated 01/27/2018  . Loose stools 01/27/2018  . DDD (degenerative disc disease), cervical 01/29/2018  . Osteoarthritis of facet joint of cervical spine 04/26/2018  . Accessory skin tags 04/28/2018  . Erectile dysfunction 07/15/2018  . Cervical spondylosis 09/15/2018  . Chronic pain disorder 11/01/2018  . Cervical radiculopathy 11/01/2018  . Spinal stenosis of cervical region 11/05/2018  . NSAID induced gastritis 01/20/2019  . Sinus bradycardia 04/04/2019  . Herniation of cervical intervertebral disc with radiculopathy 04/19/2019  . Other spondylosis with radiculopathy, cervical region 04/19/2019  . Fusion of spine of cervical region 04/19/2019  . PVC (premature ventricular contraction) 06/15/2019  . Elevated PTHrP level 12/30/2019  . Elevated calcitonin level 12/30/2019  . Essential tremor 12/30/2019  . Arthralgia of multiple joints 12/30/2019   Resolved Ambulatory Problems    Diagnosis Date Noted  . Need for influenza vaccination 03/25/2017  . Epidermal cyst 03/25/2017  . Excessive bleeding 04/24/2017  . Cough 08/30/2017  . Vision changes 10/08/2017  . Elevated bilirubin 01/27/2018   Past Medical History:  Diagnosis Date  . Bradycardia   . Diverticulitis 2007  . GERD (gastroesophageal reflux disease)   . Sleep apnea       Review of Systems See HPI.     Objective:   Physical Exam Vitals reviewed.  Constitutional:      Appearance: Normal appearance.  Neck:  Vascular: No carotid bruit.  Cardiovascular:     Rate and Rhythm: Normal rate and regular rhythm.  Pulmonary:     Effort: Pulmonary effort is normal.     Breath sounds: Normal breath sounds.  Neurological:     General: No focal deficit present.     Mental Status: He is alert and oriented to person, place, and time.     Comments: Bilateral hand essential intention tremor.   Psychiatric:        Mood and Affect: Mood normal.            Assessment & Plan:  [name] KitchenMarland KitchenCantrell [name] was seen today for follow-up.  Diagnoses and all orders for this visit:  Serum calcium elevated -     PTH, Intact and Calcium  Mixed hyperlipidemia -     atorvastatin (LIPITOR) 20 MG tablet; Take 1 tablet (20 mg total) by mouth daily.  Essential hypertension, benign -     amLODipine (NORVASC) 10 MG tablet; Take 1 tablet (10 mg total) by mouth daily.  Diarrhea, unspecified type  Memory changes -     B12 and Folate Panel  Arthralgia of multiple joints -     celecoxib (CELEBREX) 100 MG capsule; Take 1 capsule (100 mg total) by mouth 2 (two) times daily.  Status post cervical spinal fusion -     methocarbamol (ROBAXIN) 500 MG tablet; Take 1 tablet (500 mg total) by mouth every 8 (eight) hours as needed for muscle spasms. -     celecoxib (CELEBREX) 100 MG capsule; Take 1 capsule (100 mg total) by mouth 2 (two) times daily.  Essential tremor   Calcium elevated. Will add PTH. Decrease calcium by one tablet daily. He is taking 2 tablets.   SP fusion. Continue as needed robaxin.   Multiple joint pain. Start celebrex. Stop mobic.   Memory changes: MMSE looked ok. Will add b12. Vitamin D normal. Discussed aging and memory changes. Follow up in 3 months or with any sudden changes.   Essential tremor-declines medication at this point. Will re-address in 3 months.

## 2019-12-29 LAB — PTH, INTACT AND CALCIUM
Calcium: 10.3 mg/dL (ref 8.6–10.3)
PTH: 104 pg/mL — ABNORMAL HIGH (ref 14–64)

## 2019-12-29 LAB — B12 AND FOLATE PANEL
Folate: 21.9 ng/mL
Vitamin B-12: 447 pg/mL (ref 200–1100)

## 2019-12-30 ENCOUNTER — Other Ambulatory Visit: Payer: Self-pay | Admitting: Physician Assistant

## 2019-12-30 DIAGNOSIS — E349 Endocrine disorder, unspecified: Secondary | ICD-10-CM

## 2019-12-30 DIAGNOSIS — M2550 Pain in unspecified joint: Secondary | ICD-10-CM

## 2019-12-30 DIAGNOSIS — R7989 Other specified abnormal findings of blood chemistry: Secondary | ICD-10-CM

## 2019-12-30 DIAGNOSIS — G250 Essential tremor: Secondary | ICD-10-CM | POA: Insufficient documentation

## 2019-12-30 HISTORY — DX: Pain in unspecified joint: M25.50

## 2019-12-30 HISTORY — DX: Other specified abnormal findings of blood chemistry: R79.89

## 2019-12-30 HISTORY — DX: Essential tremor: G25.0

## 2019-12-30 HISTORY — DX: Endocrine disorder, unspecified: E34.9

## 2019-12-30 NOTE — Progress Notes (Signed)
Your parathyroid hormone came back elevated. Looks like you have hyperparathyroidism and need to see endocrinology for treatment.

## 2020-02-13 DIAGNOSIS — R69 Illness, unspecified: Secondary | ICD-10-CM | POA: Diagnosis not present

## 2020-02-16 ENCOUNTER — Other Ambulatory Visit: Payer: Self-pay | Admitting: Physician Assistant

## 2020-02-16 DIAGNOSIS — M47812 Spondylosis without myelopathy or radiculopathy, cervical region: Secondary | ICD-10-CM

## 2020-02-16 DIAGNOSIS — M5412 Radiculopathy, cervical region: Secondary | ICD-10-CM

## 2020-02-16 DIAGNOSIS — M5030 Other cervical disc degeneration, unspecified cervical region: Secondary | ICD-10-CM

## 2020-02-16 DIAGNOSIS — G894 Chronic pain syndrome: Secondary | ICD-10-CM

## 2020-02-22 ENCOUNTER — Other Ambulatory Visit: Payer: Self-pay | Admitting: Physician Assistant

## 2020-02-22 DIAGNOSIS — G894 Chronic pain syndrome: Secondary | ICD-10-CM

## 2020-02-22 DIAGNOSIS — M5030 Other cervical disc degeneration, unspecified cervical region: Secondary | ICD-10-CM

## 2020-02-22 DIAGNOSIS — M5412 Radiculopathy, cervical region: Secondary | ICD-10-CM

## 2020-02-22 DIAGNOSIS — M47812 Spondylosis without myelopathy or radiculopathy, cervical region: Secondary | ICD-10-CM

## 2020-03-23 DIAGNOSIS — R69 Illness, unspecified: Secondary | ICD-10-CM | POA: Diagnosis not present

## 2020-03-23 DIAGNOSIS — E211 Secondary hyperparathyroidism, not elsewhere classified: Secondary | ICD-10-CM | POA: Diagnosis not present

## 2020-03-25 ENCOUNTER — Other Ambulatory Visit: Payer: Self-pay | Admitting: Physician Assistant

## 2020-03-25 DIAGNOSIS — M2550 Pain in unspecified joint: Secondary | ICD-10-CM

## 2020-03-25 DIAGNOSIS — Z981 Arthrodesis status: Secondary | ICD-10-CM

## 2020-03-30 ENCOUNTER — Other Ambulatory Visit: Payer: Self-pay

## 2020-03-30 ENCOUNTER — Encounter: Payer: Self-pay | Admitting: Physician Assistant

## 2020-03-30 ENCOUNTER — Ambulatory Visit (INDEPENDENT_AMBULATORY_CARE_PROVIDER_SITE_OTHER): Payer: Medicare HMO | Admitting: Physician Assistant

## 2020-03-30 VITALS — BP 118/48 | HR 63 | Resp 100 | Wt 203.0 lb

## 2020-03-30 DIAGNOSIS — T43595A Adverse effect of other antipsychotics and neuroleptics, initial encounter: Secondary | ICD-10-CM | POA: Insufficient documentation

## 2020-03-30 DIAGNOSIS — Z981 Arthrodesis status: Secondary | ICD-10-CM | POA: Diagnosis not present

## 2020-03-30 DIAGNOSIS — M2550 Pain in unspecified joint: Secondary | ICD-10-CM | POA: Diagnosis not present

## 2020-03-30 DIAGNOSIS — E211 Secondary hyperparathyroidism, not elsewhere classified: Secondary | ICD-10-CM | POA: Insufficient documentation

## 2020-03-30 HISTORY — DX: Adverse effect of other antipsychotics and neuroleptics, initial encounter: T43.595A

## 2020-03-30 MED ORDER — CELECOXIB 100 MG PO CAPS: 100.0000 mg | ORAL_CAPSULE | Freq: Two times a day (BID) | ORAL | 1 refills | Status: DC

## 2020-03-30 NOTE — Progress Notes (Signed)
Subjective:    Patient ID: Alexander [name], male    DOB: 09/21/1950, 69 y.o.   MRN: [id number]  HPI  Pt is a 69 yo male to follow up on arthralgias over whole body. Pt also had elevated calcium and PTH level and was sent to endocrinology. Suspect hyperparathyroidism due to lithium. Will continue to monitor but lithium has been great for mood control and wants to stay on it. His joint pain is better on celebrex. He is doing well. His memory is stable no real problems. He lives at home by himself.   .. Active Ambulatory Problems    Diagnosis Date Noted   Essential hypertension, benign 09/30/2013   Barrett's esophagus 09/30/2013   Impaired fasting glucose 09/30/2013   BPH (benign prostatic hyperplasia) 09/30/2013   Bipolar 1 disorder, mixed ([place]) 09/30/2013   Generalized anxiety disorder 09/30/2013   Hyperlipidemia 10/03/2013   History of diverticulitis 10/20/2013   Preventive measure 10/20/2013   OSA on CPAP 12/28/2013   Osteoarthritis of carpometacarpal joint of left thumb 01/05/2015   Ear mass, left 06/06/2016   Chronic pain of right knee 03/25/2017   Seborrheic keratoses 03/25/2017   RLS (restless legs syndrome) 03/25/2017   Diarrhea 03/25/2017   Hematoma 04/24/2017   Lipoma of neck 04/24/2017   Acute non-recurrent pansinusitis 08/12/2017   Memory changes 08/30/2017   Elevated fasting glucose 08/30/2017   Reactive airway disease that is not asthma 09/13/2017   Bilateral lower extremity edema 09/13/2017   Low libido 10/08/2017   Irritable bowel syndrome with diarrhea 10/08/2017   Irregular heart beat 01/27/2018   LVH (left ventricular hypertrophy) 01/27/2018   Neck pain 01/27/2018   Serum calcium elevated 01/27/2018   Loose stools 01/27/2018   DDD (degenerative disc disease), cervical 01/29/2018   Osteoarthritis of facet joint of cervical spine 04/26/2018   Accessory skin tags 04/28/2018   Erectile dysfunction 07/15/2018   Cervical  spondylosis 09/15/2018   Chronic pain disorder 11/01/2018   Cervical radiculopathy 11/01/2018   Spinal stenosis of cervical region 11/05/2018   NSAID induced gastritis 01/20/2019   Sinus bradycardia 04/04/2019   Herniation of cervical intervertebral disc with radiculopathy 04/19/2019   Other spondylosis with radiculopathy, cervical region 04/19/2019   Fusion of spine of cervical region 04/19/2019   PVC (premature ventricular contraction) 06/15/2019   Elevated PTHrP level 12/30/2019   Elevated calcitonin level 12/30/2019   Essential tremor 12/30/2019   Arthralgia of multiple joints 12/30/2019   Hyperparathyroidism due to lithium therapy ([place]) 03/30/2020   Status post cervical spinal fusion 03/30/2020   Resolved Ambulatory Problems    Diagnosis Date Noted   Need for influenza vaccination 03/25/2017   Epidermal cyst 03/25/2017   Excessive bleeding 04/24/2017   Cough 08/30/2017   Vision changes 10/08/2017   Elevated bilirubin 01/27/2018   Past Medical History:  Diagnosis Date   Bradycardia    Diverticulitis 2007   GERD (gastroesophageal reflux disease)    Sleep apnea      Review of Systems  All other systems reviewed and are negative.      Objective:   Physical Exam Vitals reviewed.  Constitutional:      Appearance: Normal appearance.  Cardiovascular:     Rate and Rhythm: Normal rate and regular rhythm.     Pulses: Normal pulses.  Pulmonary:     Effort: Pulmonary effort is normal.     Breath sounds: Normal breath sounds.  Musculoskeletal:     Right lower leg: No edema.  Left lower leg: No edema.  Neurological:     General: No focal deficit present.     Mental Status: He is alert and oriented to person, place, and time.  Psychiatric:        Mood and Affect: Mood normal.           Assessment & Plan:  [name] KitchenMarland KitchenRafel Garde was seen today for follow-up.  Diagnoses and all orders for this visit:  Arthralgia of multiple joints -      celecoxib (CELEBREX) 100 MG capsule; Take 1 capsule (100 mg total) by mouth 2 (two) times daily.  Status post cervical spinal fusion -     celecoxib (CELEBREX) 100 MG capsule; Take 1 capsule (100 mg total) by mouth 2 (two) times daily.   Refilled celebrex. Watch for gastritis symptoms. Doing well. Seeing psychiatry regularly.  Labs UTD.  Do not have high dose flu shots.  Will get at pharmacy.   Follow up in 6 months.

## 2020-04-22 ENCOUNTER — Other Ambulatory Visit: Payer: Self-pay | Admitting: Physician Assistant

## 2020-04-22 DIAGNOSIS — K2270 Barrett's esophagus without dysplasia: Secondary | ICD-10-CM

## 2020-05-07 ENCOUNTER — Ambulatory Visit (INDEPENDENT_AMBULATORY_CARE_PROVIDER_SITE_OTHER): Payer: Medicare HMO | Admitting: Physician Assistant

## 2020-05-07 ENCOUNTER — Other Ambulatory Visit: Payer: Self-pay

## 2020-05-07 DIAGNOSIS — Z23 Encounter for immunization: Secondary | ICD-10-CM

## 2020-05-14 ENCOUNTER — Ambulatory Visit: Payer: Medicare HMO

## 2020-06-18 DIAGNOSIS — R69 Illness, unspecified: Secondary | ICD-10-CM | POA: Diagnosis not present

## 2020-06-25 ENCOUNTER — Other Ambulatory Visit: Payer: Self-pay | Admitting: Physician Assistant

## 2020-06-25 DIAGNOSIS — I10 Essential (primary) hypertension: Secondary | ICD-10-CM

## 2020-08-13 DIAGNOSIS — R69 Illness, unspecified: Secondary | ICD-10-CM | POA: Diagnosis not present

## 2020-08-27 ENCOUNTER — Telehealth: Payer: Self-pay | Admitting: General Practice

## 2020-09-21 NOTE — Telephone Encounter (Signed)
Documentation only.

## 2020-10-01 ENCOUNTER — Ambulatory Visit (INDEPENDENT_AMBULATORY_CARE_PROVIDER_SITE_OTHER): Payer: Medicare HMO | Admitting: Physician Assistant

## 2020-10-01 VITALS — BP 150/67 | HR 58 | Ht 70.5 in | Wt 213.0 lb

## 2020-10-01 DIAGNOSIS — Z981 Arthrodesis status: Secondary | ICD-10-CM

## 2020-10-01 DIAGNOSIS — K2270 Barrett's esophagus without dysplasia: Secondary | ICD-10-CM

## 2020-10-01 DIAGNOSIS — I10 Essential (primary) hypertension: Secondary | ICD-10-CM

## 2020-10-01 DIAGNOSIS — M4322 Fusion of spine, cervical region: Secondary | ICD-10-CM | POA: Diagnosis not present

## 2020-10-01 DIAGNOSIS — R7301 Impaired fasting glucose: Secondary | ICD-10-CM

## 2020-10-01 DIAGNOSIS — Z79899 Other long term (current) drug therapy: Secondary | ICD-10-CM

## 2020-10-01 DIAGNOSIS — Z0000 Encounter for general adult medical examination without abnormal findings: Secondary | ICD-10-CM | POA: Diagnosis not present

## 2020-10-01 DIAGNOSIS — Z1329 Encounter for screening for other suspected endocrine disorder: Secondary | ICD-10-CM | POA: Diagnosis not present

## 2020-10-01 DIAGNOSIS — E782 Mixed hyperlipidemia: Secondary | ICD-10-CM | POA: Diagnosis not present

## 2020-10-01 DIAGNOSIS — M2550 Pain in unspecified joint: Secondary | ICD-10-CM

## 2020-10-01 MED ORDER — PANTOPRAZOLE SODIUM 40 MG PO TBEC: 40.0000 mg | DELAYED_RELEASE_TABLET | Freq: Two times a day (BID) | ORAL | 1 refills | Status: DC

## 2020-10-01 MED ORDER — CELECOXIB 100 MG PO CAPS: 100.0000 mg | ORAL_CAPSULE | Freq: Two times a day (BID) | ORAL | 1 refills | Status: DC

## 2020-10-01 MED ORDER — ATORVASTATIN CALCIUM 20 MG PO TABS
20.0000 mg | ORAL_TABLET | Freq: Every day | ORAL | 3 refills | Status: DC
Start: 2020-10-01 — End: 2021-12-25

## 2020-10-01 MED ORDER — BENAZEPRIL HCL 20 MG PO TABS: 20.0000 mg | ORAL_TABLET | Freq: Every day | ORAL | 3 refills | Status: DC

## 2020-10-01 MED ORDER — DOXAZOSIN MESYLATE 4 MG PO TABS: 4.0000 mg | ORAL_TABLET | Freq: Two times a day (BID) | ORAL | 1 refills | Status: DC

## 2020-10-01 MED ORDER — AMLODIPINE BESYLATE 10 MG PO TABS: 10.0000 mg | ORAL_TABLET | Freq: Every day | ORAL | 3 refills | Status: DC

## 2020-10-01 NOTE — Progress Notes (Signed)
Subjective:    Patient ID: Alexander [name], male    DOB: September 03, 1950, 70 y.o.   MRN: [id number]  HPI  Patient is a 70 year old male with hypertension, PVCs, OSA, Barrett's esophagus, IBS-d, bipolar, chronic pain due to osteoarthritis who presents to the clinic for 47-month follow-up.  Patient is compliant with his blood pressure medication.  He denies any chest pain, palpitations, headache, vision changes, dizziness.  He is not checking his blood pressure at home.  Patient does complain of some intermittent neck and shoulder pain.  He denies any injury.  He has a history of fusion surgery at the cervical spine.  He denies any radicular pain or symptoms.  He does have some Robaxin that he takes as needed.  He is currently not on gabapentin or Celebrex.  Bipolar- mood controlled. No SI/HC. Managed by Dr. Ardine Eng.   CPAP-using CPAP doing well. No problems or concerns.   .. Active Ambulatory Problems    Diagnosis Date Noted  . Essential hypertension, benign 09/30/2013  . Barrett's esophagus 09/30/2013  . Impaired fasting glucose 09/30/2013  . BPH (benign prostatic hyperplasia) 09/30/2013  . Bipolar 1 disorder, mixed ([place]) 09/30/2013  . Generalized anxiety disorder 09/30/2013  . Hyperlipidemia 10/03/2013  . History of diverticulitis 10/20/2013  . Preventive measure 10/20/2013  . OSA on CPAP 12/28/2013  . Osteoarthritis of carpometacarpal joint of left thumb 01/05/2015  . Ear mass, left 06/06/2016  . Chronic pain of right knee 03/25/2017  . Seborrheic keratoses 03/25/2017  . RLS (restless legs syndrome) 03/25/2017  . Diarrhea 03/25/2017  . Hematoma 04/24/2017  . Lipoma of neck 04/24/2017  . Acute non-recurrent pansinusitis 08/12/2017  . Memory changes 08/30/2017  . Elevated fasting glucose 08/30/2017  . Reactive airway disease that is not asthma 09/13/2017  . Bilateral lower extremity edema 09/13/2017  . Low libido 10/08/2017  . Irritable bowel syndrome with diarrhea 10/08/2017   . Irregular heart beat 01/27/2018  . LVH (left ventricular hypertrophy) 01/27/2018  . Neck pain 01/27/2018  . Serum calcium elevated 01/27/2018  . Loose stools 01/27/2018  . DDD (degenerative disc disease), cervical 01/29/2018  . Osteoarthritis of facet joint of cervical spine 04/26/2018  . Accessory skin tags 04/28/2018  . Erectile dysfunction 07/15/2018  . Cervical spondylosis 09/15/2018  . Chronic pain disorder 11/01/2018  . Cervical radiculopathy 11/01/2018  . Spinal stenosis of cervical region 11/05/2018  . NSAID induced gastritis 01/20/2019  . Sinus bradycardia 04/04/2019  . Herniation of cervical intervertebral disc with radiculopathy 04/19/2019  . Other spondylosis with radiculopathy, cervical region 04/19/2019  . Fusion of spine of cervical region 04/19/2019  . PVC (premature ventricular contraction) 06/15/2019  . Elevated PTHrP level 12/30/2019  . Elevated calcitonin level 12/30/2019  . Essential tremor 12/30/2019  . Arthralgia of multiple joints 12/30/2019  . Hyperparathyroidism due to lithium therapy ([place]) 03/30/2020  . Status post cervical spinal fusion 03/30/2020   Resolved Ambulatory Problems    Diagnosis Date Noted  . Need for influenza vaccination 03/25/2017  . Epidermal cyst 03/25/2017  . Excessive bleeding 04/24/2017  . Cough 08/30/2017  . Vision changes 10/08/2017  . Elevated bilirubin 01/27/2018   Past Medical History:  Diagnosis Date  . Bradycardia   . Diverticulitis 2007  . GERD (gastroesophageal reflux disease)   . Sleep apnea       Review of Systems See HPI.     Objective:   Physical Exam Vitals reviewed.  Constitutional:      Appearance: Normal appearance. He is obese.  HENT:     Head: Normocephalic.  Neck:     Vascular: No carotid bruit.     Comments: Stiff ROM of neck. Very tight paraspinal muscles of neck.  Cardiovascular:     Rate and Rhythm: Normal rate and regular rhythm.     Pulses: Normal pulses.     Heart sounds: Normal  heart sounds.  Pulmonary:     Effort: Pulmonary effort is normal.     Breath sounds: Normal breath sounds.  Abdominal:     General: Bowel sounds are normal. There is no distension.     Palpations: Abdomen is soft.     Tenderness: There is no abdominal tenderness. There is no right CVA tenderness, left CVA tenderness or guarding.  Musculoskeletal:     Cervical back: Neck supple.  Lymphadenopathy:     Cervical: No cervical adenopathy.  Neurological:     General: No focal deficit present.     Mental Status: He is alert.  Psychiatric:        Mood and Affect: Mood normal.       .. Depression screen [facility] 2/9 10/01/2020 12/28/2019 11/22/2018 09/03/2018 04/26/2018  Decreased Interest 0 0 0 0 0  Down, Depressed, Hopeless 1 0 0 0 1  PHQ - 2 Score 1 0 0 0 1  Altered sleeping 0 2 1 - 0  Tired, decreased energy 1 1 1  - 1  Change in appetite 0 0 0 - 0  Feeling bad or failure about yourself  0 0 0 - 0  Trouble concentrating 0 3 0 - 0  Moving slowly or fidgety/restless 0 0 0 - 0  Suicidal thoughts 0 0 1 - 0  PHQ-9 Score 2 6 3  - 2  Difficult doing work/chores Not difficult at all Somewhat difficult Not difficult at all - Not difficult at all   .[name] GAD 7 : Generalized Anxiety Score 10/01/2020 12/28/2019 11/22/2018 09/03/2018  Nervous, Anxious, on Edge 1 1 1  0  Control/stop worrying 0 0 0 0  Worry too much - different things 0 0 0 0  Trouble relaxing 0 1 2 0  Restless 0 2 2 0  Easily annoyed or irritable 0 0 0 0  Afraid - awful might happen 0 0 0 0  Total GAD 7 Score 1 4 5  0  Anxiety Difficulty Not difficult at all Somewhat difficult Somewhat difficult Not difficult at all        Assessment & Plan:  [name] KitchenMarland KitchenDemetrio Leighty was seen today for follow-up.  Diagnoses and all orders for this visit:  Essential hypertension, benign -     COMPLETE METABOLIC PANEL WITH GFR -     amLODipine (NORVASC) 10 MG tablet; Take 1 tablet (10 mg total) by mouth daily. -     benazepril (LOTENSIN) 20 MG tablet; Take 1  tablet (20 mg total) by mouth daily. -     doxazosin (CARDURA) 4 MG tablet; Take 1 tablet (4 mg total) by mouth 2 (two) times daily.  Impaired fasting glucose -     Hemoglobin A1c  Mixed hyperlipidemia -     Lipid Panel w/reflex Direct LDL -     atorvastatin (LIPITOR) 20 MG tablet; Take 1 tablet (20 mg total) by mouth daily.  Preventative health care -     CBC with Differential/Platelet -     COMPLETE METABOLIC PANEL WITH GFR -     Lipid Panel w/reflex Direct LDL -     Hemoglobin A1c -  TSH  Thyroid disorder screen -     TSH  Barrett's esophagus without dysplasia -     pantoprazole (PROTONIX) 40 MG tablet; Take 1 tablet (40 mg total) by mouth 2 (two) times daily.  Fusion of spine of cervical region  Arthralgia of multiple joints -     celecoxib (CELEBREX) 100 MG capsule; Take 1 capsule (100 mg total) by mouth 2 (two) times daily.  Status post cervical spinal fusion -     celecoxib (CELEBREX) 100 MG capsule; Take 1 capsule (100 mg total) by mouth 2 (two) times daily.  Medication management -     Lithium level   Pt is having some flares of his chronic neck pain and s/p fusion repair.  Consider celebrex back.  Discussed symptomatic treatment with icy hot, tens unit, muscle relaxer. Consider adding back gabapentin that he was on before.  Ok to schedule follow up with ortho to see if any further action needs to be taken.   Needs labs. Will fax labs to [facility], Dr. Ardine Eng.  Mood stable.  BP not quite to goal. Keep record of this at home.  Medications refilled.   Continue CPAP for OSA.

## 2020-10-02 ENCOUNTER — Encounter: Payer: Self-pay | Admitting: Physician Assistant

## 2020-10-02 LAB — COMPLETE METABOLIC PANEL WITH GFR
AG Ratio: 1.8 (calc) (ref 1.0–2.5)
ALT: 26 U/L (ref 9–46)
AST: 22 U/L (ref 10–35)
Albumin: 4.4 g/dL (ref 3.6–5.1)
Alkaline phosphatase (APISO): 95 U/L (ref 35–144)
BUN: 10 mg/dL (ref 7–25)
CO2: 25 mmol/L (ref 20–32)
Calcium: 10.5 mg/dL — ABNORMAL HIGH (ref 8.6–10.3)
Chloride: 107 mmol/L (ref 98–110)
Creat: 0.93 mg/dL (ref 0.70–1.18)
GFR, Est African American: 96 mL/min/{1.73_m2} (ref >=60)
GFR, Est Non African American: 83 mL/min/{1.73_m2} (ref >=60)
Globulin: 2.5 g/dL (calc) (ref 1.9–3.7)
Glucose, Bld: 112 mg/dL — ABNORMAL HIGH (ref 65–99)
Potassium: 4.3 mmol/L (ref 3.5–5.3)
Sodium: 139 mmol/L (ref 135–146)
Total Bilirubin: 1 mg/dL (ref 0.2–1.2)
Total Protein: 6.9 g/dL (ref 6.1–8.1)

## 2020-10-02 LAB — LIPID PANEL W/REFLEX DIRECT LDL
Cholesterol: 157 mg/dL (ref <200)
HDL: 52 mg/dL (ref >=40)
LDL Cholesterol (Calc): 77 mg/dL (calc)
Non-HDL Cholesterol (Calc): 105 mg/dL (calc) (ref <130)
Total CHOL/HDL Ratio: 3 (calc) (ref <5.0)
Triglycerides: 182 mg/dL — ABNORMAL HIGH (ref <150)

## 2020-10-02 LAB — CBC WITH DIFFERENTIAL/PLATELET
Absolute Monocytes: 475 cells/uL (ref 200–950)
Basophils Absolute: 59 cells/uL (ref 0–200)
Basophils Relative: 0.9 %
Eosinophils Absolute: 178 cells/uL (ref 15–500)
Eosinophils Relative: 2.7 %
HCT: 39.8 % (ref 38.5–50.0)
Hemoglobin: 13.8 g/dL (ref 13.2–17.1)
Lymphs Abs: 1360 cells/uL (ref 850–3900)
MCH: 32.4 pg (ref 27.0–33.0)
MCHC: 34.7 g/dL (ref 32.0–36.0)
MCV: 93.4 fL (ref 80.0–100.0)
MPV: 11.1 fL (ref 7.5–12.5)
Monocytes Relative: 7.2 %
Neutro Abs: 4528 cells/uL (ref 1500–7800)
Neutrophils Relative %: 68.6 %
Platelets: 207 10*3/uL (ref 140–400)
RBC: 4.26 10*6/uL (ref 4.20–5.80)
RDW: 12.5 % (ref 11.0–15.0)
Total Lymphocyte: 20.6 %
WBC: 6.6 10*3/uL (ref 3.8–10.8)

## 2020-10-02 LAB — HEMOGLOBIN A1C
Hgb A1c MFr Bld: 5.5 % of total Hgb (ref <5.7)
Mean Plasma Glucose: 111 mg/dL
eAG (mmol/L): 6.2 mmol/L

## 2020-10-02 LAB — TSH: TSH: 3.33 mIU/L (ref 0.40–4.50)

## 2020-10-02 LAB — LITHIUM LEVEL: Lithium Lvl: 0.9 mmol/L (ref 0.6–1.2)

## 2020-10-02 NOTE — Progress Notes (Signed)
Alexander [name],   Lithium level fine. JJ please fax to Dr. Elmer Sow the lab work.  Thyroid normal range.  A1C normal range despite some fasting elevation.  LDL to goal under 100.  Kidney and liver stable.  Calcium stable. Make sure stay on vitamin D.  Hemoglobin great.  TG a little elevated. Continue to watch processed foods, sugars and carbs.   Overall really good labs.

## 2021-01-08 ENCOUNTER — Ambulatory Visit (INDEPENDENT_AMBULATORY_CARE_PROVIDER_SITE_OTHER): Payer: Medicare HMO | Admitting: Physician Assistant

## 2021-01-08 ENCOUNTER — Other Ambulatory Visit: Payer: Self-pay

## 2021-01-08 VITALS — BP 125/63 | HR 71 | Ht 70.0 in | Wt 209.1 lb

## 2021-01-08 DIAGNOSIS — Z0000 Encounter for general adult medical examination without abnormal findings: Secondary | ICD-10-CM | POA: Diagnosis not present

## 2021-01-08 NOTE — Progress Notes (Signed)
MEDICARE ANNUAL WELLNESS VISIT  01/08/2021  Subjective:  Alexander [name] is a 70 y.o. male patient of Alden Hipp, Royetta Car, PA-C who had a [employer] Visit today. Alexander [name] is Retired and lives alone. he has 1 child. he reports that he is socially active and does interact with friends/family regularly. he is minimally physically active and enjoys collecting things and building things.  Patient Care Team: Lavada Mesi as PCP - General (Family Medicine)  Advanced Directives 01/08/2021 04/15/2019 05/05/2018 05/10/2015 12/28/2013  Does Patient Have a Medical Advance Directive? Yes Yes Yes Yes Patient has advance directive, copy not in chart;Patient would like information  Type of Advance Directive [place];Living will Living will [place];Living will Living will -  Does patient want to make changes to medical advance directive? No - Patient declined - - No - Patient declined -  Copy of [place] in Chart? Yes - validated most recent copy scanned in chart (See row information) - No - copy requested Yes [facility] Utilization Over the Past 12 Months: # of hospitalizations or ER visits: 0 # of surgeries: 0   Review of Systems    Patient reports that his overall health is unchanged when compared to last year.  Review of Systems: History obtained from chart review and the patient  All other systems negative.  Pain Assessment Pain : No/denies pain     Current Medications & Allergies (verified) Allergies as of 01/08/2021      Reactions   Effexor [venlafaxine]    Had a bad withdrawal reaction after being off for 20 days      Medication List       Accurate as of January 08, 2021  1:41 PM. If you have any questions, ask your nurse or doctor.        AMBULATORY NON FORMULARY MEDICATION Increase Auto-PAP machine with heated humidifier settings to 9-20cm H2O.  Dx: Obstructive sleep apnea.   amLODipine 10 MG  tablet Commonly known as: NORVASC Take 1 tablet (10 mg total) by mouth daily.   aspirin 81 MG tablet Take 81 mg by mouth daily.   atorvastatin 20 MG tablet Commonly known as: LIPITOR Take 1 tablet (20 mg total) by mouth daily.   benazepril 20 MG tablet Commonly known as: LOTENSIN Take 1 tablet (20 mg total) by mouth daily.   celecoxib 100 MG capsule Commonly known as: CELEBREX Take 1 capsule (100 mg total) by mouth 2 (two) times daily.   cholecalciferol 25 MCG (1000 UNIT) tablet Commonly known as: VITAMIN D3 Take 1,000 Units by mouth in the morning and at bedtime.   doxazosin 4 MG tablet Commonly known as: CARDURA Take 1 tablet (4 mg total) by mouth 2 (two) times daily.   KRILL OIL PO Take by mouth daily.   lithium carbonate 450 MG CR tablet Commonly known as: ESKALITH Take 450 mg by mouth daily.   pantoprazole 40 MG tablet Commonly known as: PROTONIX Take 1 tablet (40 mg total) by mouth 2 (two) times daily.   sertraline 100 MG tablet Commonly known as: ZOLOFT Take 100 mg by mouth daily.       History (reviewed): Past Medical History:  Diagnosis Date  . Barrett's esophagus 09/30/2013  . Bipolar 1 disorder, mixed ([place]) 09/30/2013   [place], Dr. Ardine Eng   . BPH (benign prostatic hyperplasia) 09/30/2013  . Bradycardia   . Diverticulitis 2007  . Essential hypertension, benign  09/30/2013  . GERD (gastroesophageal reflux disease)   . Sleep apnea    Past Surgical History:  Procedure Laterality Date  . ANTERIOR CERVICAL DECOMP/DISCECTOMY FUSION N/A 04/19/2019   Procedure: ANTERIOR CERVICAL DISCECTOMY FUSION cervical three to four, cervical four to five.;  Surgeon: Jessy Oto, MD;  Location: [place];  Service: Orthopedics;  Laterality: N/A;  . COLOSTOMY REVERSAL    . large bowel perforation     Family History  Problem Relation Age of Onset  . CAD Neg Hx    Social History   Socioeconomic History  . Marital status: Widowed    Spouse name: Not on file   . Number of children: 1  . Years of education: 70  . Highest education level: Some college, no degree  Occupational History    Comment: Retired  Tobacco Use  . Smoking status: Former Smoker    Quit date: 10/01/1983    Years since quitting: 37.2  . Smokeless tobacco: Never Used  Substance and Sexual Activity  . Alcohol use: Yes    Alcohol/week: 2.0 - 4.0 standard drinks    Types: 2 - 4 Cans of beer per week    Comment: daily  . Drug use: No  . Sexual activity: Not Currently    Partners: Female  Other Topics Concern  . Not on file  Social History Narrative   Lives alone. His daughter lives close by. Likes to collect things and build things.   Social Determinants of Health   Financial Resource Strain: Low Risk   . Difficulty of Paying Living Expenses: Not hard at all  Food Insecurity: No Food Insecurity  . Worried About Charity fundraiser in the Last Year: Never true  . Ran Out of Food in the Last Year: Never true  Transportation Needs: No Transportation Needs  . Lack of Transportation (Medical): No  . Lack of Transportation (Non-Medical): No  Physical Activity: Inactive  . Days of Exercise per Week: 0 days  . Minutes of Exercise per Session: 0 min  Stress: No Stress Concern Present  . Feeling of Stress : Not at all  Social Connections: Socially Isolated  . Frequency of Communication with Friends and Family: Once a week  . Frequency of Social Gatherings with Friends and Family: Never  . Attends Religious Services: Never  . Active Member of Clubs or Organizations: No  . Attends Archivist Meetings: Never  . Marital Status: Widowed    Activities of Daily Living In your present state of health, do you have any difficulty performing the following activities: 01/08/2021  Hearing? Y  Comment has noticed hearing loss in both ears  Vision? N  Difficulty concentrating or making decisions? Y  Comment little memory loss.(recent)  Walking or climbing stairs? Y   Comment has knee up with climbing stairs.  Dressing or bathing? N  Doing errands, shopping? N  Preparing Food and eating ? N  Using the Toilet? N  In the past six months, have you accidently leaked urine? N  Do you have problems with loss of bowel control? N  Managing your Medications? N  Managing your Finances? N  Comment his daugher helps with that.  Housekeeping or managing your Housekeeping? N  Some recent data might be hidden    Patient Education/Literacy How often do you need to have someone help you when you read instructions, pamphlets, or other written materials from your doctor or pharmacy?: 1 - Never What is the last grade level you  completed in school?: Some college (1 year)  Exercise Current Exercise Habits: Home exercise routine, Type of exercise: walking, Time (Minutes): 20, Frequency (Times/Week): 1, Weekly Exercise (Minutes/Week): 20, Intensity: Mild, Exercise limited by: None identified  Diet Patient reports consuming 2 meals a day and 1 snack(s) a day Patient reports that his primary diet is: Regular Patient reports that she does have regular access to food.   Depression Screen PHQ 2/9 Scores 01/08/2021 10/01/2020 12/28/2019 11/22/2018 09/03/2018 04/26/2018 10/07/2017  PHQ - 2 Score 0 1 0 0 0 1 0  PHQ- 9 Score 0 2 6 3  - 2 0     Fall Risk Fall Risk  01/08/2021 10/01/2020 12/28/2019  Falls in the past year? 0 0 0  Number falls in past yr: 0 0 0  Injury with Fall? 0 0 0  Risk for fall due to : No Fall Risks - -  Follow up Falls evaluation completed Falls evaluation completed Falls evaluation completed     Objective:   BP 125/63   Pulse 71   Ht 5\' 10"  (1.778 m)   Wt 209 lb 1.3 oz (94.8 kg)   SpO2 100%   BMI 30.00 kg/m   Last Weight  Most recent update: 01/08/2021  1:29 PM   Weight  94.8 kg (209 lb 1.3 oz)            Body mass index is 30 kg/m.  Hearing/Vision  . Donald L did not have difficulty with hearing/understanding during the face-to-face  interview . Donald L did not have difficulty with his vision during the face-to-face interview . Reports that he has had a formal eye exam by an eye care professional within the past year . Reports that he has not had a formal hearing evaluation within the past year  Cognitive Function: 6CIT Screen 01/08/2021 08/26/2017  What Year? 0 points 0 points  What month? 0 points 0 points  What time? 0 points 0 points  Count back from 20 0 points 0 points  Months in reverse 4 points 2 points  Repeat phrase 0 points 10 points  Total Score 4 12    Normal Cognitive Function Screening: Yes (Normal:0-7, Significant for Dysfunction: >8)  Immunization & Health Maintenance Record Immunization History  Administered Date(s) Administered  . Fluad Quad(high Dose 65+) 05/07/2020  . Influenza Split 05/04/2010, 05/08/2012, 03/04/2016  . Influenza, High Dose Seasonal PF 03/25/2017  . Influenza,inj,Quad PF,6+ Mos 04/12/2014, 05/08/2016, 04/26/2018, 04/04/2019  . Moderna Sars-Covid-2 Vaccination 11/27/2019, 12/22/2019, 07/07/2020, 12/25/2020  . Pneumococcal Conjugate-13 03/24/2016  . Pneumococcal Polysaccharide-23 08/04/2012, 04/26/2018  . Tdap 08/04/2012, 02/28/2015  . Zoster Recombinat (Shingrix) 11/09/2017, 02/19/2018    Health Maintenance  Topic Date Due  . Pneumococcal Vaccine 23-41 Years old (1 of 4 - PCV13) 01/08/2022 (Originally 03/23/1957)  . INFLUENZA VACCINE  03/04/2021  . COLONOSCOPY (Pts 45-44yrs Insurance coverage will need to be confirmed)  10/22/2022  . TETANUS/TDAP  02/27/2025  . COVID-19 Vaccine  Completed  . Hepatitis C Screening  Completed  . PNA vac Low Risk Adult  Completed  . Zoster Vaccines- Shingrix  Completed  . HPV VACCINES  Aged Out       Assessment  This is a routine wellness examination for [employer].  Health Maintenance: Due or Overdue There are no preventive care reminders to display for this patient.  [name] does not need a referral for Community  Assistance: Care Management:   no Social Work:    no Prescription Assistance:  no Nutrition/Diabetes Education:  no   Plan:  Personalized Goals Goals Addressed              This Visit's Progress   .  Patient Stated (pt-stated)        01/08/2021 AWV Goal: Exercise for General Health   Patient will verbalize understanding of the benefits of increased physical activity:  Exercising regularly is important. It will improve your overall fitness, flexibility, and endurance.  Regular exercise also will improve your overall health. It can help you control your weight, reduce stress, and improve your bone density.  Over the next year, patient will increase physical activity as tolerated with a goal of at least 150 minutes of moderate physical activity per week.   You can tell that you are exercising at a moderate intensity if your heart starts beating faster and you start breathing faster but can still hold a conversation.  Moderate-intensity exercise ideas include:  Walking 1 mile (1.6 km) in about 15 minutes  Biking  Hiking  Golfing  Dancing  Water aerobics  Patient will verbalize understanding of everyday activities that increase physical activity by providing examples like the following: ? Yard work, such as: ? Pushing a Conservation officer, nature ? Raking and bagging leaves ? Washing your car ? Pushing a stroller ? Shoveling snow ? Gardening ? Washing windows or floors  Patient will be able to explain general safety guidelines for exercising:   Before you start a new exercise program, talk with your health care provider.  Do not exercise so much that you hurt yourself, feel dizzy, or get very short of breath.  Wear comfortable clothes and wear shoes with good support.  Drink plenty of water while you exercise to prevent dehydration or heat stroke.  Work out until your breathing and your heartbeat get faster.       Personalized Health Maintenance & Screening  Recommendations  There are no preventive care reminders to display for this patient.   Lung Cancer Screening Recommended: no (Low Dose CT Chest recommended if Age 8-80 years, 30 pack-year currently smoking OR have quit w/in past 15 years) Hepatitis C Screening recommended: no HIV Screening recommended: no  Advanced Directives: Written information was not given per the patient's request.  Referrals & Orders No orders of the defined types were placed in this encounter.   Follow-up Plan . Follow-up with Donella Stade, PA-C as planned . Medicare wellness in one year.   I have personally reviewed and noted the following in the patient's chart:   . Medical and social history . Use of alcohol, tobacco or illicit drugs  . Current medications and supplements . Functional ability and status . Nutritional status . Physical activity . Advanced directives . List of other physicians . Hospitalizations, surgeries, and ER visits in previous 12 months . Vitals . Screenings to include cognitive, depression, and falls . Referrals and appointments  In addition, I have reviewed and discussed with patient certain preventive protocols, quality metrics, and best practice recommendations. A written personalized care plan for preventive services as well as general preventive health recommendations were provided to patient.     Tinnie Gens, RN  01/08/2021

## 2021-01-08 NOTE — Patient Instructions (Addendum)
[place] Maintenance Summary and Written Plan of Care  Alexander [name] ,  Thank you for allowing me to perform your Medicare Annual Wellness Visit and for your ongoing commitment to your health.   Health Maintenance & Immunization History Health Maintenance  Topic Date Due  . Pneumococcal Vaccine 71-71 Years old (1 of 4 - PCV13) 01/08/2022 (Originally 03/23/1957)  . INFLUENZA VACCINE  03/04/2021  . COLONOSCOPY (Pts 45-40yrs Insurance coverage will need to be confirmed)  10/22/2022  . TETANUS/TDAP  02/27/2025  . COVID-19 Vaccine  Completed  . Hepatitis C Screening  Completed  . PNA vac Low Risk Adult  Completed  . Zoster Vaccines- Shingrix  Completed  . HPV VACCINES  Aged Out   Immunization History  Administered Date(s) Administered  . Fluad Quad(high Dose 65+) 05/07/2020  . Influenza Split 05/04/2010, 05/08/2012, 03/04/2016  . Influenza, High Dose Seasonal PF 03/25/2017  . Influenza,inj,Quad PF,6+ Mos 04/12/2014, 05/08/2016, 04/26/2018, 04/04/2019  . Moderna Sars-Covid-2 Vaccination 11/27/2019, 12/22/2019, 07/07/2020, 12/25/2020  . Pneumococcal Conjugate-13 03/24/2016  . Pneumococcal Polysaccharide-23 08/04/2012, 04/26/2018  . Tdap 08/04/2012, 02/28/2015  . Zoster Recombinat (Shingrix) 11/09/2017, 02/19/2018    These are the patient goals that we discussed: Goals Addressed              This Visit's Progress   .  Patient Stated (pt-stated)        01/08/2021 AWV Goal: Exercise for General Health   Patient will verbalize understanding of the benefits of increased physical activity:  Exercising regularly is important. It will improve your overall fitness, flexibility, and endurance.  Regular exercise also will improve your overall health. It can help you control your weight, reduce stress, and improve your bone density.  Over the next year, patient will increase physical activity as tolerated with a goal of at least 150 minutes of moderate  physical activity per week.   You can tell that you are exercising at a moderate intensity if your heart starts beating faster and you start breathing faster but can still hold a conversation.  Moderate-intensity exercise ideas include:  Walking 1 mile (1.6 km) in about 15 minutes  Biking  Hiking  Golfing  Dancing  Water aerobics  Patient will verbalize understanding of everyday activities that increase physical activity by providing examples like the following: ? Yard work, such as: ? Pushing a Conservation officer, nature ? Raking and bagging leaves ? Washing your car ? Pushing a stroller ? Shoveling snow ? Gardening ? Washing windows or floors  Patient will be able to explain general safety guidelines for exercising:   Before you start a new exercise program, talk with your health care provider.  Do not exercise so much that you hurt yourself, feel dizzy, or get very short of breath.  Wear comfortable clothes and wear shoes with good support.  Drink plenty of water while you exercise to prevent dehydration or heat stroke.  Work out until your breathing and your heartbeat get faster.         This is a list of Health Maintenance Items that are overdue or due now: There are no preventive care reminders to display for this patient.   Orders/Referrals Placed Today: No orders of the defined types were placed in this encounter.  (Contact our referral department at [phone] if you have not spoken with someone about your referral appointment within the next 5 days)    Follow-up Plan . Follow-up with Donella Stade, PA-C as planned . Medicare wellness  in one year.       Health Maintenance, Male Adopting a healthy lifestyle and getting preventive care are important in promoting health and wellness. Ask your health care provider about:  The right schedule for you to have regular tests and exams.  Things you can do on your own to prevent diseases and keep yourself  healthy. What should I know about diet, weight, and exercise? Eat a healthy diet  Eat a diet that includes plenty of vegetables, fruits, low-fat dairy products, and lean protein.  Do not eat a lot of foods that are high in solid fats, added sugars, or sodium.   Maintain a healthy weight Body mass index (BMI) is a measurement that can be used to identify possible weight problems. It estimates body fat based on height and weight. Your health care provider can help determine your BMI and help you achieve or maintain a healthy weight. Get regular exercise Get regular exercise. This is one of the most important things you can do for your health. Most adults should:  Exercise for at least 150 minutes each week. The exercise should increase your heart rate and make you sweat (moderate-intensity exercise).  Do strengthening exercises at least twice a week. This is in addition to the moderate-intensity exercise.  Spend less time sitting. Even light physical activity can be beneficial. Watch cholesterol and blood lipids Have your blood tested for lipids and cholesterol at 71 years of age, then have this test every 5 years. You may need to have your cholesterol levels checked more often if:  Your lipid or cholesterol levels are high.  You are older than 71 years of age.  You are at high risk for heart disease. What should I know about cancer screening? Many types of cancers can be detected early and may often be prevented. Depending on your health history and family history, you may need to have cancer screening at various ages. This may include screening for:  Colorectal cancer.  Prostate cancer.  Skin cancer.  Lung cancer. What should I know about heart disease, diabetes, and high blood pressure? Blood pressure and heart disease  High blood pressure causes heart disease and increases the risk of stroke. This is more likely to develop in people who have high blood pressure readings, are  of African descent, or are overweight.  Talk with your health care provider about your target blood pressure readings.  Have your blood pressure checked: ? Every 3-5 years if you are 71-71 years of age. ? Every year if you are 71 years old or older.  If you are between the ages of 64 and 10 and are a current or former smoker, ask your health care provider if you should have a one-time screening for abdominal aortic aneurysm (AAA). Diabetes Have regular diabetes screenings. This checks your fasting blood sugar level. Have the screening done:  Once every three years after age 7 if you are at a normal weight and have a low risk for diabetes.  More often and at a younger age if you are overweight or have a high risk for diabetes. What should I know about preventing infection? Hepatitis B If you have a higher risk for hepatitis B, you should be screened for this virus. Talk with your health care provider to find out if you are at risk for hepatitis B infection. Hepatitis C Blood testing is recommended for:  Everyone born from [birth year] through 1966.  Anyone with known risk factors for hepatitis C.  Sexually transmitted infections (STIs)  You should be screened each year for STIs, including gonorrhea and chlamydia, if: ? You are sexually active and are younger than 71 years of age. ? You are older than 71 years of age and your health care provider tells you that you are at risk for this type of infection. ? Your sexual activity has changed since you were last screened, and you are at increased risk for chlamydia or gonorrhea. Ask your health care provider if you are at risk.  Ask your health care provider about whether you are at high risk for HIV. Your health care provider may recommend a prescription medicine to help prevent HIV infection. If you choose to take medicine to prevent HIV, you should first get tested for HIV. You should then be tested every 3 months for as long as you are taking  the medicine. Follow these instructions at home: Lifestyle  Do not use any products that contain nicotine or tobacco, such as cigarettes, e-cigarettes, and chewing tobacco. If you need help quitting, ask your health care provider.  Do not use street drugs.  Do not share needles.  Ask your health care provider for help if you need support or information about quitting drugs. Alcohol use  Do not drink alcohol if your health care provider tells you not to drink.  If you drink alcohol: ? Limit how much you have to 0-2 drinks a day. ? Be aware of how much alcohol is in your drink. In the [place] one drink equals one 12 oz bottle of beer (355 mL), one 5 oz glass of wine (148 mL), or one 1 oz glass of hard liquor (44 mL). General instructions  Schedule regular health, dental, and eye exams.  Stay current with your vaccines.  Tell your health care provider if: ? You often feel depressed. ? You have ever been abused or do not feel safe at home. Summary  Adopting a healthy lifestyle and getting preventive care are important in promoting health and wellness.  Follow your health care provider's instructions about healthy diet, exercising, and getting tested or screened for diseases.  Follow your health care provider's instructions on monitoring your cholesterol and blood pressure. This information is not intended to replace advice given to you by your health care provider. Make sure you discuss any questions you have with your health care provider. Document Revised: 07/14/2018 Document Reviewed: 07/14/2018 Elsevier Patient Education  2021 [employer].

## 2021-02-11 DIAGNOSIS — R69 Illness, unspecified: Secondary | ICD-10-CM | POA: Diagnosis not present

## 2021-03-13 ENCOUNTER — Other Ambulatory Visit: Payer: Self-pay | Admitting: Physician Assistant

## 2021-03-13 DIAGNOSIS — K2270 Barrett's esophagus without dysplasia: Secondary | ICD-10-CM

## 2021-03-14 DIAGNOSIS — G4733 Obstructive sleep apnea (adult) (pediatric): Secondary | ICD-10-CM | POA: Diagnosis not present

## 2021-04-01 ENCOUNTER — Ambulatory Visit (INDEPENDENT_AMBULATORY_CARE_PROVIDER_SITE_OTHER): Payer: Medicare HMO | Admitting: Physician Assistant

## 2021-04-01 ENCOUNTER — Ambulatory Visit (INDEPENDENT_AMBULATORY_CARE_PROVIDER_SITE_OTHER): Payer: Medicare HMO

## 2021-04-01 ENCOUNTER — Other Ambulatory Visit: Payer: Self-pay

## 2021-04-01 ENCOUNTER — Encounter: Payer: Self-pay | Admitting: Physician Assistant

## 2021-04-01 ENCOUNTER — Other Ambulatory Visit: Payer: Self-pay | Admitting: Physician Assistant

## 2021-04-01 VITALS — BP 145/54 | HR 60 | Ht 70.0 in | Wt 204.0 lb

## 2021-04-01 DIAGNOSIS — M25562 Pain in left knee: Secondary | ICD-10-CM

## 2021-04-01 DIAGNOSIS — G8929 Other chronic pain: Secondary | ICD-10-CM

## 2021-04-01 DIAGNOSIS — Z79899 Other long term (current) drug therapy: Secondary | ICD-10-CM | POA: Diagnosis not present

## 2021-04-01 DIAGNOSIS — Z9989 Dependence on other enabling machines and devices: Secondary | ICD-10-CM

## 2021-04-01 DIAGNOSIS — M25561 Pain in right knee: Secondary | ICD-10-CM

## 2021-04-01 DIAGNOSIS — Z23 Encounter for immunization: Secondary | ICD-10-CM | POA: Diagnosis not present

## 2021-04-01 DIAGNOSIS — I10 Essential (primary) hypertension: Secondary | ICD-10-CM | POA: Diagnosis not present

## 2021-04-01 DIAGNOSIS — M1712 Unilateral primary osteoarthritis, left knee: Secondary | ICD-10-CM

## 2021-04-01 DIAGNOSIS — G4733 Obstructive sleep apnea (adult) (pediatric): Secondary | ICD-10-CM | POA: Diagnosis not present

## 2021-04-01 HISTORY — DX: Unilateral primary osteoarthritis, left knee: M17.12

## 2021-04-01 MED ORDER — CELECOXIB 200 MG PO CAPS: ORAL_CAPSULE | ORAL | 1 refills | Status: DC

## 2021-04-01 NOTE — Progress Notes (Signed)
Left knee arthritis. Follow up with sports medicine Dr. Darene Lamer.

## 2021-04-01 NOTE — Progress Notes (Signed)
Subjective:    Patient ID: Alexander [name], male    DOB: November 13, 1950, 70 y.o.   MRN: [id number]  HPI Pt is a 70 yo male with HTN, OSA on CPAP, Bipolar 1 who presents to the clinic for medication refills.   CPAP is uses every night with no problems.   Denies any CP, palpitations, headaches or vision changes.   BH, Dr. Ardine [name], needs blood work and he comes in with order form.   He is having bilateral knee pain but left is worse than right. Pain seems to be more behind the knee. Worse when extended for long periods of time and then goes to walk. No recent trauma. Remembers injection years ago but not sure if helped. Not catching or giving way. Feels stiff and achy.   .. Active Ambulatory Problems    Diagnosis Date Noted   Essential hypertension, benign 09/30/2013   Barrett's esophagus 09/30/2013   Impaired fasting glucose 09/30/2013   BPH (benign prostatic hyperplasia) 09/30/2013   Bipolar 1 disorder, mixed ([place]) 09/30/2013   Generalized anxiety disorder 09/30/2013   Hyperlipidemia 10/03/2013   History of diverticulitis 10/20/2013   Preventive measure 10/20/2013   OSA on CPAP 12/28/2013   Osteoarthritis of carpometacarpal joint of left thumb 01/05/2015   Ear mass, left 06/06/2016   Chronic pain of right knee 03/25/2017   Seborrheic keratoses 03/25/2017   RLS (restless legs syndrome) 03/25/2017   Diarrhea 03/25/2017   Hematoma 04/24/2017   Lipoma of neck 04/24/2017   Acute non-recurrent pansinusitis 08/12/2017   Memory changes 08/30/2017   Elevated fasting glucose 08/30/2017   Reactive airway disease that is not asthma 09/13/2017   Bilateral lower extremity edema 09/13/2017   Low libido 10/08/2017   Irritable bowel syndrome with diarrhea 10/08/2017   Irregular heart beat 01/27/2018   LVH (left ventricular hypertrophy) 01/27/2018   Neck pain 01/27/2018   Serum calcium elevated 01/27/2018   Loose stools 01/27/2018   DDD (degenerative disc disease), cervical 01/29/2018    Osteoarthritis of facet joint of cervical spine 04/26/2018   Accessory skin tags 04/28/2018   Erectile dysfunction 07/15/2018   Cervical spondylosis 09/15/2018   Chronic pain disorder 11/01/2018   Cervical radiculopathy 11/01/2018   Spinal stenosis of cervical region 11/05/2018   NSAID induced gastritis 01/20/2019   Sinus bradycardia 04/04/2019   Herniation of cervical intervertebral disc with radiculopathy 04/19/2019   Other spondylosis with radiculopathy, cervical region 04/19/2019   Fusion of spine of cervical region 04/19/2019   PVC (premature ventricular contraction) 06/15/2019   Elevated PTHrP level 12/30/2019   Elevated calcitonin level 12/30/2019   Essential tremor 12/30/2019   Arthralgia of multiple joints 12/30/2019   Hyperparathyroidism due to lithium therapy ([place]) 03/30/2020   Status post cervical spinal fusion 03/30/2020   Resolved Ambulatory Problems    Diagnosis Date Noted   Need for influenza vaccination 03/25/2017   Epidermal cyst 03/25/2017   Excessive bleeding 04/24/2017   Cough 08/30/2017   Vision changes 10/08/2017   Elevated bilirubin 01/27/2018   Past Medical History:  Diagnosis Date   Bradycardia    Diverticulitis 2007   GERD (gastroesophageal reflux disease)    Sleep apnea      Review of Systems See HPI.     Objective:   Physical Exam Vitals reviewed.  Constitutional:      Appearance: Normal appearance. He is obese.  HENT:     Head: Normocephalic.  Cardiovascular:     Rate and Rhythm: Normal rate and regular rhythm.  Pulses: Normal pulses.  Pulmonary:     Effort: Pulmonary effort is normal.     Breath sounds: Normal breath sounds.  Musculoskeletal:     Right lower leg: No edema.     Left lower leg: No edema.     Comments: No edema bilaterally.  NROM bilaterally.  No pain to palpation over bilateral anterior or posterior knee.  Negative anterior drawer.  Negative mcmurrays.   Neurological:     General: No focal deficit present.      Mental Status: He is alert and oriented to person, place, and time.  Psychiatric:        Mood and Affect: Mood normal.      .. Depression screen [facility] 2/9 04/01/2021 01/08/2021 10/01/2020 12/28/2019 11/22/2018  Decreased Interest 0 0 0 0 0  Down, Depressed, Hopeless 0 0 1 0 0  PHQ - 2 Score 0 0 1 0 0  Altered sleeping 0 0 0 2 1  Tired, decreased energy 0 0 '1 1 1  '$ Change in appetite 0 0 0 0 0  Feeling bad or failure about yourself  0 0 0 0 0  Trouble concentrating 0 0 0 3 0  Moving slowly or fidgety/restless 0 0 0 0 0  Suicidal thoughts 0 0 0 0 1  PHQ-9 Score 0 0 '2 6 3  '$ Difficult doing work/chores Not difficult at all Not difficult at all Not difficult at all Somewhat difficult Not difficult at all   .[name] GAD 7 : Generalized Anxiety Score 04/01/2021 10/01/2020 12/28/2019 11/22/2018  Nervous, Anxious, on Edge 0 '1 1 1  '$ Control/stop worrying 0 0 0 0  Worry too much - different things 0 0 0 0  Trouble relaxing 0 0 1 2  Restless 0 0 2 2  Easily annoyed or irritable 0 0 0 0  Afraid - awful might happen 0 0 0 0  Total GAD 7 Score 0 '1 4 5  '$ Anxiety Difficulty Not difficult at all Not difficult at all Somewhat difficult Somewhat difficult        Assessment & Plan:  [name] KitchenMarland KitchenBraian [name] was seen today for follow-up.  Diagnoses and all orders for this visit:  Essential hypertension, benign -     COMPLETE METABOLIC PANEL WITH GFR  Flu vaccine need -     Flu Vaccine QUAD High Dose(Fluad)  Chronic pain of left knee -     DG Knee Complete 4 Views Left; Future -     DG Knee 1-2 Views Right; Future -     celecoxib (CELEBREX) 200 MG capsule; Take one tablet twice a day.  OSA on CPAP  Medication management -     TSH -     Lithium level   BP under 150/90. No DM or CKD. 70 yo.  Continue medications.  Cmp ordered.   Get xrays for knees. Likely OA. Increased celebrex to '200mg'$  bid.  Consider referral to Dr. Darene [name], sports medicine.  Continue NSAID rub.   Labs ordered for [facility].   Follow up in 6  months.

## 2021-04-02 LAB — COMPLETE METABOLIC PANEL WITH GFR
AG Ratio: 1.9 (calc) (ref 1.0–2.5)
ALT: 29 U/L (ref 9–46)
AST: 26 U/L (ref 10–35)
Albumin: 4.4 g/dL (ref 3.6–5.1)
Alkaline phosphatase (APISO): 93 U/L (ref 35–144)
BUN: 11 mg/dL (ref 7–25)
CO2: 26 mmol/L (ref 20–32)
Calcium: 10.8 mg/dL — ABNORMAL HIGH (ref 8.6–10.3)
Chloride: 109 mmol/L (ref 98–110)
Creat: 0.84 mg/dL (ref 0.70–1.28)
Globulin: 2.3 g/dL (calc) (ref 1.9–3.7)
Glucose, Bld: 117 mg/dL — ABNORMAL HIGH (ref 65–99)
Potassium: 4.4 mmol/L (ref 3.5–5.3)
Sodium: 141 mmol/L (ref 135–146)
Total Bilirubin: 1 mg/dL (ref 0.2–1.2)
Total Protein: 6.7 g/dL (ref 6.1–8.1)
eGFR: 93 mL/min/{1.73_m2} (ref >=60)

## 2021-04-02 LAB — TSH: TSH: 1.61 mIU/L (ref 0.40–4.50)

## 2021-04-02 LAB — LITHIUM LEVEL: Lithium Lvl: 0.4 mmol/L — ABNORMAL LOW (ref 0.6–1.2)

## 2021-04-02 NOTE — Progress Notes (Signed)
Lithium level is low but he stated they had been decreasing medication. Please send to Dr. Ardine Eng.

## 2021-04-02 NOTE — Progress Notes (Signed)
Alexander [name],   Thyroid looks great.  Kidney, liver, calcium look good.  Waiting on lithium level and will send to Dr. Ardine Eng.

## 2021-04-09 ENCOUNTER — Ambulatory Visit (INDEPENDENT_AMBULATORY_CARE_PROVIDER_SITE_OTHER): Payer: Medicare HMO | Admitting: Sports Medicine

## 2021-04-09 ENCOUNTER — Ambulatory Visit (INDEPENDENT_AMBULATORY_CARE_PROVIDER_SITE_OTHER): Payer: Medicare HMO

## 2021-04-09 ENCOUNTER — Other Ambulatory Visit: Payer: Self-pay

## 2021-04-09 DIAGNOSIS — M51369 Other intervertebral disc degeneration, lumbar region without mention of lumbar back pain or lower extremity pain: Secondary | ICD-10-CM

## 2021-04-09 DIAGNOSIS — M1712 Unilateral primary osteoarthritis, left knee: Secondary | ICD-10-CM

## 2021-04-09 DIAGNOSIS — M5136 Other intervertebral disc degeneration, lumbar region: Secondary | ICD-10-CM

## 2021-04-09 DIAGNOSIS — M545 Low back pain, unspecified: Secondary | ICD-10-CM

## 2021-04-09 HISTORY — DX: Other intervertebral disc degeneration, lumbar region without mention of lumbar back pain or lower extremity pain: M51.369

## 2021-04-09 HISTORY — DX: Other intervertebral disc degeneration, lumbar region: M51.36

## 2021-04-09 MED ORDER — GABAPENTIN 300 MG PO CAPS: 300.0000 mg | ORAL_CAPSULE | Freq: Every day | ORAL | 3 refills | Status: DC

## 2021-04-09 NOTE — Assessment & Plan Note (Signed)
Alexander [name] also has axial low back pain, right-sided with radiation down both legs. Worse with sitting, flexion, Valsalva. This is consistent with a discogenic source of pain, adding x-rays, PT, Neurontin at night. Return to see me in 6 weeks, MRI for interventional planning if no better.

## 2021-04-09 NOTE — Progress Notes (Signed)
    Procedures performed today:    Procedure: Real-time Ultrasound Guided injection of the left knee Device: Samsung HS60  Verbal informed consent obtained.  Time-out conducted.  Noted no overlying erythema, induration, or other signs of local infection.  Skin prepped in a sterile fashion.  Local anesthesia: Topical Ethyl chloride.  With sterile technique and under real time ultrasound guidance: Noted trace effusion, 1 cc Kenalog 40, 2 cc lidocaine, 2 cc bupivacaine injected easily Completed without difficulty  Advised to call if fevers/chills, erythema, induration, drainage, or persistent bleeding.  Images permanently stored and available for review in PACS.  Impression: Technically successful ultrasound guided injection.  Independent interpretation of notes and tests performed by another provider:   None.  Brief History, Exam, Impression, and Recommendations:    Primary osteoarthritis of left knee Alexander [name] is a pleasant 70 year old male, he has a long history of left knee pain, he has tried multiple oral medications without much improvement, today we did a left knee injection, adding formal physical therapy, return to see me in 4 to 6 weeks  Lumbar degenerative disc disease Alexander [name] also has axial low back pain, right-sided with radiation down both legs. Worse with sitting, flexion, Valsalva. This is consistent with a discogenic source of pain, adding x-rays, PT, Neurontin at night. Return to see me in 6 weeks, MRI for interventional planning if no better.    ___________________________________________ Gwen Her. Dianah Field, M.D., ABFM., CAQSM. Primary Care and [place] Instructor of [place] of [facility] of Medicine

## 2021-04-09 NOTE — Assessment & Plan Note (Signed)
Alexander [name] is a pleasant 69 year old male, he has a long history of left knee pain, he has tried multiple oral medications without much improvement, today we did a left knee injection, adding formal physical therapy, return to see me in 4 to 6 weeks

## 2021-04-11 ENCOUNTER — Telehealth: Payer: Self-pay | Admitting: Lab

## 2021-04-11 NOTE — Progress Notes (Signed)
  Chronic Care Management   Outreach Note  04/11/2021 Name: Alexander [name] MRN: [id number] DOB: 17-Apr-1951  Referred by: Donella Stade, PA-C Reason for referral : Medication Management   An unsuccessful telephone outreach was attempted today. The patient was referred to the pharmacist for assistance with care management and care coordination.   Follow Up Plan:   [place]

## 2021-04-16 ENCOUNTER — Encounter: Payer: Self-pay | Admitting: Rehabilitative and Restorative Service Providers"

## 2021-04-16 ENCOUNTER — Other Ambulatory Visit: Payer: Self-pay

## 2021-04-16 ENCOUNTER — Ambulatory Visit (INDEPENDENT_AMBULATORY_CARE_PROVIDER_SITE_OTHER): Payer: Medicare HMO | Admitting: Rehabilitative and Restorative Service Providers"

## 2021-04-16 DIAGNOSIS — M25562 Pain in left knee: Secondary | ICD-10-CM

## 2021-04-16 NOTE — Therapy (Signed)
[facility] [address] [place], [place], [address] Phone: [phone]   Fax:  [phone]  Patient Details  Name: Alexander [name] MRN: [id number] Date of Birth: 05-01-1951 Referring Provider:  Silverio Decamp  Encounter Date: 04/16/2021  The patient arrived early for 9:30 PT visit (checked in at 9:07).  When PT went to get patient at 9:30, he was no longer in our lobby.  Therefore, eval did not happen.  No charge.   Starlena Beil 04/16/2021, 9:41 AM  [facility] [address] [place], [place], [address] Phone: [phone]   Fax:  [phone]

## 2021-04-23 ENCOUNTER — Telehealth: Payer: Self-pay | Admitting: Lab

## 2021-04-23 NOTE — Progress Notes (Signed)
  Chronic Care Management   Outreach Note  04/23/2021 Name: ROMARI GASPARRO MRN: [id number] DOB: 1950-09-16  Referred by: Donella Stade, PA-C Reason for referral : Medication Management   A second unsuccessful telephone outreach was attempted today. The patient was referred to pharmacist for assistance with care management and care coordination.  Follow Up Plan:  [place]

## 2021-05-06 DIAGNOSIS — R69 Illness, unspecified: Secondary | ICD-10-CM | POA: Diagnosis not present

## 2021-05-06 DIAGNOSIS — F3130 Bipolar disorder, current episode depressed, mild or moderate severity, unspecified: Secondary | ICD-10-CM | POA: Diagnosis not present

## 2021-05-21 ENCOUNTER — Other Ambulatory Visit: Payer: Self-pay

## 2021-05-21 ENCOUNTER — Ambulatory Visit (INDEPENDENT_AMBULATORY_CARE_PROVIDER_SITE_OTHER): Payer: Medicare HMO | Admitting: Sports Medicine

## 2021-05-21 DIAGNOSIS — M51369 Other intervertebral disc degeneration, lumbar region without mention of lumbar back pain or lower extremity pain: Secondary | ICD-10-CM

## 2021-05-21 DIAGNOSIS — M5136 Other intervertebral disc degeneration, lumbar region: Secondary | ICD-10-CM | POA: Diagnosis not present

## 2021-05-21 DIAGNOSIS — M1712 Unilateral primary osteoarthritis, left knee: Secondary | ICD-10-CM

## 2021-05-21 NOTE — Assessment & Plan Note (Signed)
Alexander [name] returns, we did a knee injection at the last visit, he returns today pain-free. Return as needed.

## 2021-05-21 NOTE — Assessment & Plan Note (Signed)
Axial low back pain, right-sided with radiation down both legs worse with sitting, flexion, Valsalva, did some conditioning, x-rays, he is removing some stumps from his yard, at this point he can live with the pain, he will return to see me for this on an as-needed basis. Next step would be MRI for epidural planning.

## 2021-05-21 NOTE — Progress Notes (Signed)
    Procedures performed today:    None.  Independent interpretation of notes and tests performed by another provider:   None.  Brief History, Exam, Impression, and Recommendations:    Primary osteoarthritis of left knee Alexander [name] returns, we did a knee injection at the last visit, he returns today pain-free. Return as needed.  Lumbar degenerative disc disease Axial low back pain, right-sided with radiation down both legs worse with sitting, flexion, Valsalva, did some conditioning, x-rays, he is removing some stumps from his yard, at this point he can live with the pain, he will return to see me for this on an as-needed basis. Next step would be MRI for epidural planning.    ___________________________________________ Gwen Her. Dianah Field, M.D., ABFM., CAQSM. Primary Care and [place] Instructor of [place] of [facility] of Medicine

## 2021-06-19 ENCOUNTER — Other Ambulatory Visit: Payer: Self-pay

## 2021-06-19 ENCOUNTER — Encounter: Payer: Self-pay | Admitting: Physician Assistant

## 2021-06-19 ENCOUNTER — Ambulatory Visit (INDEPENDENT_AMBULATORY_CARE_PROVIDER_SITE_OTHER): Payer: Medicare HMO | Admitting: Physician Assistant

## 2021-06-19 VITALS — BP 135/52 | HR 65 | Temp 98.0°F | Ht 70.0 in | Wt 212.0 lb

## 2021-06-19 DIAGNOSIS — G4733 Obstructive sleep apnea (adult) (pediatric): Secondary | ICD-10-CM | POA: Diagnosis not present

## 2021-06-19 DIAGNOSIS — Z9989 Dependence on other enabling machines and devices: Secondary | ICD-10-CM | POA: Diagnosis not present

## 2021-06-19 MED ORDER — AMBULATORY NON FORMULARY MEDICATION: 0 refills | Status: DC

## 2021-06-19 NOTE — Progress Notes (Signed)
Subjective:    Patient ID: Alexander [name], male    DOB: 04/20/1951, 70 y.o.   MRN: [id number]  HPI Pt is a 70 yo male with HTN, OSA, Bipolar who presents to the clinic needing another CPAP. His CPAP broke about 3 months ago and he is starting to feel the difference. He is wanting another one. His old pressure was 9cmH20 from [facility]. He has done really well and had this one since 2016.   .. Active Ambulatory Problems    Diagnosis Date Noted   Essential hypertension, benign 09/30/2013   Barrett's esophagus 09/30/2013   Impaired fasting glucose 09/30/2013   BPH (benign prostatic hyperplasia) 09/30/2013   Bipolar 1 disorder, mixed ([place]) 09/30/2013   Generalized anxiety disorder 09/30/2013   Hyperlipidemia 10/03/2013   History of diverticulitis 10/20/2013   Preventive measure 10/20/2013   OSA on CPAP 12/28/2013   Osteoarthritis of carpometacarpal joint of left thumb 01/05/2015   Ear mass, left 06/06/2016   Chronic pain of right knee 03/25/2017   Seborrheic keratoses 03/25/2017   RLS (restless legs syndrome) 03/25/2017   Diarrhea 03/25/2017   Hematoma 04/24/2017   Lipoma of neck 04/24/2017   Acute non-recurrent pansinusitis 08/12/2017   Memory changes 08/30/2017   Elevated fasting glucose 08/30/2017   Reactive airway disease that is not asthma 09/13/2017   Bilateral lower extremity edema 09/13/2017   Low libido 10/08/2017   Irritable bowel syndrome with diarrhea 10/08/2017   Irregular heart beat 01/27/2018   LVH (left ventricular hypertrophy) 01/27/2018   Neck pain 01/27/2018   Serum calcium elevated 01/27/2018   Loose stools 01/27/2018   DDD (degenerative disc disease), cervical 01/29/2018   Osteoarthritis of facet joint of cervical spine 04/26/2018   Accessory skin tags 04/28/2018   Erectile dysfunction 07/15/2018   Cervical spondylosis 09/15/2018   Chronic pain disorder 11/01/2018   Cervical radiculopathy 11/01/2018   Spinal stenosis of cervical region 11/05/2018    NSAID induced gastritis 01/20/2019   Sinus bradycardia 04/04/2019   Herniation of cervical intervertebral disc with radiculopathy 04/19/2019   Other spondylosis with radiculopathy, cervical region 04/19/2019   Fusion of spine of cervical region 04/19/2019   PVC (premature ventricular contraction) 06/15/2019   Elevated PTHrP level 12/30/2019   Elevated calcitonin level 12/30/2019   Essential tremor 12/30/2019   Arthralgia of multiple joints 12/30/2019   Hyperparathyroidism due to lithium therapy ([place]) 03/30/2020   Status post cervical spinal fusion 03/30/2020   Primary osteoarthritis of left knee 04/01/2021   Lumbar degenerative disc disease 04/09/2021   Resolved Ambulatory Problems    Diagnosis Date Noted   Need for influenza vaccination 03/25/2017   Epidermal cyst 03/25/2017   Excessive bleeding 04/24/2017   Cough 08/30/2017   Vision changes 10/08/2017   Elevated bilirubin 01/27/2018   Past Medical History:  Diagnosis Date   Bradycardia    Diverticulitis 2007   GERD (gastroesophageal reflux disease)    Sleep apnea       Review of Systems See HPI.     Objective:   Physical Exam Vitals reviewed.  Constitutional:      Appearance: Normal appearance. He is obese.  HENT:     Head: Normocephalic.  Cardiovascular:     Rate and Rhythm: Normal rate and regular rhythm.     Pulses: Normal pulses.     Heart sounds: Normal heart sounds.  Pulmonary:     Effort: Pulmonary effort is normal.     Breath sounds: Normal breath sounds.  Neurological:  General: No focal deficit present.     Mental Status: He is alert and oriented to person, place, and time.  Psychiatric:        Mood and Affect: Mood normal.          Assessment & Plan:  [name] KitchenMarland KitchenElenore Rota L was seen today for sleep apnea.  Diagnoses and all orders for this visit:  OSA on CPAP -     AMBULATORY NON FORMULARY MEDICATION; Continuous positive airway pressure (CPAP) machine set on (9 cmH2O), with all supplemental  supplies as needed.   Will send orders and old sleep study to [facility] to get new machine.

## 2021-07-12 ENCOUNTER — Other Ambulatory Visit: Payer: Self-pay | Admitting: Physician Assistant

## 2021-07-12 ENCOUNTER — Other Ambulatory Visit: Payer: Self-pay | Admitting: Sports Medicine

## 2021-07-12 DIAGNOSIS — G8929 Other chronic pain: Secondary | ICD-10-CM

## 2021-07-12 DIAGNOSIS — M5136 Other intervertebral disc degeneration, lumbar region: Secondary | ICD-10-CM

## 2021-07-12 DIAGNOSIS — M25562 Pain in left knee: Secondary | ICD-10-CM

## 2021-07-20 DIAGNOSIS — G4733 Obstructive sleep apnea (adult) (pediatric): Secondary | ICD-10-CM | POA: Diagnosis not present

## 2021-07-24 ENCOUNTER — Telehealth: Payer: Self-pay | Admitting: Neurology

## 2021-07-24 NOTE — Telephone Encounter (Signed)
[facility] set up PAP therapy 07/20/2022 Equipment: Airsense 11 Mask: N20 large Pressure setting: not documented Follow up needed between 08/20/2021-10/18/2021 Scheduled 10/01/2021 Notes to be faxed to: [phone]

## 2021-07-25 ENCOUNTER — Other Ambulatory Visit: Payer: Self-pay | Admitting: Physician Assistant

## 2021-07-25 DIAGNOSIS — I10 Essential (primary) hypertension: Secondary | ICD-10-CM

## 2021-08-02 ENCOUNTER — Ambulatory Visit: Payer: Medicare HMO | Admitting: Sports Medicine

## 2021-08-20 DIAGNOSIS — G4733 Obstructive sleep apnea (adult) (pediatric): Secondary | ICD-10-CM | POA: Diagnosis not present

## 2021-09-17 ENCOUNTER — Other Ambulatory Visit: Payer: Self-pay | Admitting: Physician Assistant

## 2021-09-17 DIAGNOSIS — I10 Essential (primary) hypertension: Secondary | ICD-10-CM

## 2021-09-20 DIAGNOSIS — G4733 Obstructive sleep apnea (adult) (pediatric): Secondary | ICD-10-CM | POA: Diagnosis not present

## 2021-10-01 ENCOUNTER — Encounter: Payer: Self-pay | Admitting: Physician Assistant

## 2021-10-01 ENCOUNTER — Ambulatory Visit (INDEPENDENT_AMBULATORY_CARE_PROVIDER_SITE_OTHER): Payer: Medicare HMO | Admitting: Physician Assistant

## 2021-10-01 ENCOUNTER — Other Ambulatory Visit: Payer: Self-pay

## 2021-10-01 ENCOUNTER — Ambulatory Visit (INDEPENDENT_AMBULATORY_CARE_PROVIDER_SITE_OTHER): Payer: Medicare HMO

## 2021-10-01 VITALS — BP 158/65 | HR 74 | Ht 70.0 in | Wt 210.0 lb

## 2021-10-01 DIAGNOSIS — G250 Essential tremor: Secondary | ICD-10-CM

## 2021-10-01 DIAGNOSIS — M160 Bilateral primary osteoarthritis of hip: Secondary | ICD-10-CM | POA: Diagnosis not present

## 2021-10-01 DIAGNOSIS — I10 Essential (primary) hypertension: Secondary | ICD-10-CM

## 2021-10-01 DIAGNOSIS — M25562 Pain in left knee: Secondary | ICD-10-CM

## 2021-10-01 DIAGNOSIS — E782 Mixed hyperlipidemia: Secondary | ICD-10-CM

## 2021-10-01 DIAGNOSIS — M25552 Pain in left hip: Secondary | ICD-10-CM

## 2021-10-01 DIAGNOSIS — R7301 Impaired fasting glucose: Secondary | ICD-10-CM | POA: Diagnosis not present

## 2021-10-01 DIAGNOSIS — M25551 Pain in right hip: Secondary | ICD-10-CM | POA: Diagnosis not present

## 2021-10-01 DIAGNOSIS — G8929 Other chronic pain: Secondary | ICD-10-CM | POA: Diagnosis not present

## 2021-10-01 DIAGNOSIS — M542 Cervicalgia: Secondary | ICD-10-CM | POA: Diagnosis not present

## 2021-10-01 DIAGNOSIS — Z79899 Other long term (current) drug therapy: Secondary | ICD-10-CM

## 2021-10-01 DIAGNOSIS — E781 Pure hyperglyceridemia: Secondary | ICD-10-CM | POA: Diagnosis not present

## 2021-10-01 DIAGNOSIS — G4733 Obstructive sleep apnea (adult) (pediatric): Secondary | ICD-10-CM

## 2021-10-01 DIAGNOSIS — Z9989 Dependence on other enabling machines and devices: Secondary | ICD-10-CM

## 2021-10-01 MED ORDER — CELECOXIB 200 MG PO CAPS: ORAL_CAPSULE | ORAL | 1 refills | Status: DC

## 2021-10-01 MED ORDER — PROPRANOLOL HCL 40 MG PO TABS: 40.0000 mg | ORAL_TABLET | Freq: Two times a day (BID) | ORAL | 0 refills | Status: DC

## 2021-10-01 NOTE — Progress Notes (Signed)
Subjective:    Patient ID: Alexander [name], male    DOB: 1951-04-19, 71 y.o.   MRN: [id number]  HPI  This is  a 71 year old male with history of Lithium use for diagnosis of Bipolar 1 presenting for a 6 month follow up with chief complaint today of worsening tremor. 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 He reports no changes to his Lithium dosage since last seen 6 months ago. He has experienced diarrhea every morning 2-3 times every morning about 15 minutes after eating. He has tried cutting out dairy and this has not helped. This has been going on since multiple bowel surgeries in 2007. He has been experiencing some tachycardia upon standing following exertion, such as when he has been working on his lawnmower. He denies any falls or recent COVID diagnosis. He reports recently getting a new CPAP machine which he uses regularly.  He has had ongoing pain on right side of his neck, midline lower back, and bilateral hips, especially with prolonged walking. This is improved with rest. He does not check his BP at home but would like a recommendation.  Review of Systems  Constitutional: Negative.  Negative for fatigue, fever and unexpected weight change.  HENT: Negative.  Negative for congestion, ear discharge and rhinorrhea.   Respiratory:  Positive for shortness of breath (From exertion). Negative for cough and chest tightness.   Genitourinary: Negative.  Negative for dysuria.  Musculoskeletal:  Positive for arthralgias (Right sided neck pain without radiation, midline lower back pain without radiation).  Skin:        "Skin feels different"  Neurological:  Positive for dizziness and tremors (Worsening). Negative for facial asymmetry, numbness and headaches.      Objective:   Physical Exam Constitutional:      General: He is not in acute distress.     Appearance: Normal appearance.  HENT:     Head: Normocephalic and atraumatic.     Right Ear: Tympanic membrane normal.     Left Ear: Tympanic membrane normal.  Eyes:     General: No scleral icterus.    Extraocular Movements: Extraocular movements intact.     Conjunctiva/sclera: Conjunctivae normal.     Pupils: Pupils are equal, round, and reactive to light.  Cardiovascular:     Rate and Rhythm: Normal rate and regular rhythm.     Pulses: Normal pulses.     Heart sounds: Normal heart sounds. No murmur heard.   No friction rub. No gallop.  Pulmonary:     Effort: Pulmonary effort is normal. No respiratory distress.     Breath sounds: No wheezing.  Musculoskeletal:        General: Tenderness (Lumbar tenderness) present. No deformity.     Cervical back: Tenderness (right sided, to palpation; no radiation of pain) present.     Left lower leg: No edema.  Neurological:     Mental Status: He is alert and oriented to person, place, and time.  Psychiatric:        Mood and Affect: Mood normal.        Behavior: Behavior normal.        Thought Content: Thought content normal.   .. Depression screen [facility] 2/9 10/01/2021 04/01/2021 01/08/2021 10/01/2020 12/28/2019  Decreased Interest 0 0 0 0 0  Down, Depressed, Hopeless 0 0 0 1 0  PHQ - 2 Score 0 0 0 1 0  Altered sleeping - 0 0 0 2  Tired, decreased energy - 0 0 1 1  Change in appetite - 0 0 0 0  Feeling bad or failure about yourself  - 0 0 0 0  Trouble concentrating - 0 0 0 3  Moving slowly or fidgety/restless - 0 0 0 0  Suicidal thoughts - 0 0 0 0  PHQ-9 Score - 0 0 2 6  Difficult doing work/chores - Not difficult at all Not difficult at all Not difficult at all Somewhat difficult       Assessment & Plan:   [name] KitchenMarland KitchenDillian [name] was seen today for follow-up.  Diagnoses and all orders for this visit:  Essential hypertension, benign -     COMPLETE METABOLIC PANEL WITH GFR  Mixed hyperlipidemia -     Lipid Panel w/reflex Direct LDL  Essential  tremor -     Lithium level -     TSH -     CBC with Differential/Platelet -     propranolol (INDERAL) 40 MG tablet; Take 1 tablet (40 mg total) by mouth 2 (two) times daily.  OSA on CPAP  Medication management -     Lithium level -     TSH -     CBC with Differential/Platelet  Chronic pain of left knee -     celecoxib (CELEBREX) 200 MG capsule; Take one tablet twice a day.  Neck pain -     DG Cervical Spine Complete; Future  Bilateral hip pain -     DG HIPS BILAT WITH PELVIS 3-4 VIEWS; Future  Elevated fasting glucose -     Hemoglobin A1C w/out eAG -     TEST AUTHORIZATION  Hypertriglyceridemia   Stop dairy/ half and half consumption in the morning and keep breakfast foods bland to see if this helps with the diarrhea. Note given to get out of jury duty.  Increase gabapentin for improved pain management.cervical xray ordered. Bilateral hip xray ordered. Likely some OA. Continue celebrex.  Start Propranolol 40mg  BID for tremor. Likely somewhat due to lithium. Will check lithium level.   BP elevated. Start checking at home with cuff.   Follow up in 4 weeks to reassess.

## 2021-10-01 NOTE — Patient Instructions (Addendum)
Mediterranean diet BP cuff welch allen or omron.  Start propranolol twice a day.  Increase gabapentin to twice a day

## 2021-10-02 NOTE — Progress Notes (Signed)
Need A1C added due to elevated glucose.  Normal hemoglobin.  Thyroid looks great.  LDL looks great.  TG elevated. Mediterranean diet could help with this.  Liver and kidney look good.

## 2021-10-03 ENCOUNTER — Encounter: Payer: Self-pay | Admitting: Physician Assistant

## 2021-10-03 DIAGNOSIS — M160 Bilateral primary osteoarthritis of hip: Secondary | ICD-10-CM

## 2021-10-03 HISTORY — DX: Bilateral primary osteoarthritis of hip: M16.0

## 2021-10-03 NOTE — Progress Notes (Signed)
Moderate degenerative changes C3 through C6. Certainly could cause pain.

## 2021-10-03 NOTE — Progress Notes (Signed)
Bilateral hip osteoarthritis. Need to follow up with Dr. Darene Lamer.

## 2021-10-04 LAB — CBC WITH DIFFERENTIAL/PLATELET
Absolute Monocytes: 449 cells/uL (ref 200–950)
Basophils Absolute: 40 cells/uL (ref 0–200)
Basophils Relative: 0.6 %
Eosinophils Absolute: 198 cells/uL (ref 15–500)
Eosinophils Relative: 3 %
HCT: 40.6 % (ref 38.5–50.0)
Hemoglobin: 13.7 g/dL (ref 13.2–17.1)
Lymphs Abs: 1234 cells/uL (ref 850–3900)
MCH: 31.9 pg (ref 27.0–33.0)
MCHC: 33.7 g/dL (ref 32.0–36.0)
MCV: 94.4 fL (ref 80.0–100.0)
MPV: 11.6 fL (ref 7.5–12.5)
Monocytes Relative: 6.8 %
Neutro Abs: 4679 cells/uL (ref 1500–7800)
Neutrophils Relative %: 70.9 %
Platelets: 205 10*3/uL (ref 140–400)
RBC: 4.3 10*6/uL (ref 4.20–5.80)
RDW: 12.9 % (ref 11.0–15.0)
Total Lymphocyte: 18.7 %
WBC: 6.6 10*3/uL (ref 3.8–10.8)

## 2021-10-04 LAB — COMPLETE METABOLIC PANEL WITH GFR
AG Ratio: 1.8 (calc) (ref 1.0–2.5)
ALT: 31 U/L (ref 9–46)
AST: 23 U/L (ref 10–35)
Albumin: 4.6 g/dL (ref 3.6–5.1)
Alkaline phosphatase (APISO): 95 U/L (ref 35–144)
BUN: 11 mg/dL (ref 7–25)
CO2: 26 mmol/L (ref 20–32)
Calcium: 10.9 mg/dL — ABNORMAL HIGH (ref 8.6–10.3)
Chloride: 109 mmol/L (ref 98–110)
Creat: 0.9 mg/dL (ref 0.70–1.28)
Globulin: 2.5 g/dL (calc) (ref 1.9–3.7)
Glucose, Bld: 115 mg/dL — ABNORMAL HIGH (ref 65–99)
Potassium: 4.6 mmol/L (ref 3.5–5.3)
Sodium: 141 mmol/L (ref 135–146)
Total Bilirubin: 1.5 mg/dL — ABNORMAL HIGH (ref 0.2–1.2)
Total Protein: 7.1 g/dL (ref 6.1–8.1)
eGFR: 91 mL/min/{1.73_m2} (ref >=60)

## 2021-10-04 LAB — LITHIUM LEVEL: Lithium Lvl: 0.8 mmol/L (ref 0.6–1.2)

## 2021-10-04 LAB — LIPID PANEL W/REFLEX DIRECT LDL
Cholesterol: 151 mg/dL (ref <200)
HDL: 55 mg/dL (ref >=40)
LDL Cholesterol (Calc): 70 mg/dL (calc)
Non-HDL Cholesterol (Calc): 96 mg/dL (calc) (ref <130)
Total CHOL/HDL Ratio: 2.7 (calc) (ref <5.0)
Triglycerides: 180 mg/dL — ABNORMAL HIGH (ref <150)

## 2021-10-04 LAB — TEST AUTHORIZATION

## 2021-10-04 LAB — TSH: TSH: 2.32 mIU/L (ref 0.40–4.50)

## 2021-10-04 LAB — HEMOGLOBIN A1C W/OUT EAG: Hgb A1c MFr Bld: 5.3 % of total Hgb (ref <5.7)

## 2021-10-04 NOTE — Progress Notes (Signed)
A1C normal. No diabetes.

## 2021-10-05 ENCOUNTER — Other Ambulatory Visit: Payer: Self-pay | Admitting: Physician Assistant

## 2021-10-05 DIAGNOSIS — K2270 Barrett's esophagus without dysplasia: Secondary | ICD-10-CM

## 2021-10-09 ENCOUNTER — Ambulatory Visit (INDEPENDENT_AMBULATORY_CARE_PROVIDER_SITE_OTHER): Payer: Medicare HMO | Admitting: Sports Medicine

## 2021-10-09 ENCOUNTER — Other Ambulatory Visit: Payer: Self-pay

## 2021-10-09 DIAGNOSIS — M5136 Other intervertebral disc degeneration, lumbar region: Secondary | ICD-10-CM

## 2021-10-09 DIAGNOSIS — M47812 Spondylosis without myelopathy or radiculopathy, cervical region: Secondary | ICD-10-CM

## 2021-10-09 DIAGNOSIS — G250 Essential tremor: Secondary | ICD-10-CM

## 2021-10-09 DIAGNOSIS — M51369 Other intervertebral disc degeneration, lumbar region without mention of lumbar back pain or lower extremity pain: Secondary | ICD-10-CM

## 2021-10-09 MED ORDER — PRIMIDONE 50 MG PO TABS: 25.0000 mg | ORAL_TABLET | Freq: Every day | ORAL | 3 refills | Status: DC

## 2021-10-09 MED ORDER — DEXAMETHASONE 4 MG PO TABS: 4.0000 mg | ORAL_TABLET | Freq: Three times a day (TID) | ORAL | 0 refills | Status: DC

## 2021-10-09 NOTE — Assessment & Plan Note (Signed)
Not any better with propranolol, starting primidone, follow this up with PCP. ?Starting a half tab nightly, max dose is 750 mg.  He can do an up titration with his PCP. ?

## 2021-10-09 NOTE — Assessment & Plan Note (Signed)
Alexander [name] is a pleasant 71 year old male, he is post C3-C5 ACDF, did well initially, now having increasing pain both at the skull base on the right and further down, nothing overtly radicular today. ?I did review his x-rays, he has adjacent level disease worst at the C5-6 and C6-7 levels. ?We will treat him conservatively, Decadron, he declines prednisone, hold off on Celebrex until done with Decadron and then restart. ?Adding some formal physical therapy at [facility] per his request. ?Return to see me in 6 weeks, MRI for interventional planning if no better. ?

## 2021-10-09 NOTE — Assessment & Plan Note (Signed)
Alexander [name] also has lumbar DDD, we treated him successfully back in 2022, we will revisit conservative treatment. ?Lumbar spine x-rays show multilevel DDD, worst at the upper lumbar spine. ?Adding physical therapy as above, Decadron, return to see me in 6 weeks, MRIs for interventional planning if not better. ?

## 2021-10-09 NOTE — Progress Notes (Signed)
? ? ?  Procedures performed today:   ? ?None. ? ?Independent interpretation of notes and tests performed by another provider:  ? ?None. ? ?Brief History, Exam, Impression, and Recommendations:   ? ?Cervical spondylosis ?Alexander [name] is a pleasant 71 year old male, he is post C3-C5 ACDF, did well initially, now having increasing pain both at the skull base on the right and further down, nothing overtly radicular today. ?I did review his x-rays, he has adjacent level disease worst at the C5-6 and C6-7 levels. ?We will treat him conservatively, Decadron, he declines prednisone, hold off on Celebrex until done with Decadron and then restart. ?Adding some formal physical therapy at [facility] per his request. ?Return to see me in 6 weeks, MRI for interventional planning if no better. ? ?Lumbar degenerative disc disease ?Alexander [name] also has lumbar DDD, we treated him successfully back in 2022, we will revisit conservative treatment. ?Lumbar spine x-rays show multilevel DDD, worst at the upper lumbar spine. ?Adding physical therapy as above, Decadron, return to see me in 6 weeks, MRIs for interventional planning if not better. ? ?Benign essential tremor ?Not any better with propranolol, starting primidone, follow this up with PCP. ?Starting a half tab nightly, max dose is 750 mg.  He can do an up titration with his PCP. ? ? ? ?___________________________________________ ?Gwen Her. Dianah Field, M.D., ABFM., CAQSM. ?Primary Care and Sports Medicine ?[place] ? ?Adjunct Instructor of Family Medicine  ?[employer] ?

## 2021-10-18 DIAGNOSIS — G4733 Obstructive sleep apnea (adult) (pediatric): Secondary | ICD-10-CM | POA: Diagnosis not present

## 2021-10-25 ENCOUNTER — Ambulatory Visit (INDEPENDENT_AMBULATORY_CARE_PROVIDER_SITE_OTHER): Payer: Medicare HMO | Admitting: Physician Assistant

## 2021-10-25 ENCOUNTER — Encounter: Payer: Self-pay | Admitting: Physician Assistant

## 2021-10-25 ENCOUNTER — Other Ambulatory Visit: Payer: Self-pay

## 2021-10-25 VITALS — BP 126/73 | HR 55 | Resp 20 | Ht 70.0 in | Wt 203.0 lb

## 2021-10-25 DIAGNOSIS — R490 Dysphonia: Secondary | ICD-10-CM

## 2021-10-25 DIAGNOSIS — R110 Nausea: Secondary | ICD-10-CM

## 2021-10-25 DIAGNOSIS — G250 Essential tremor: Secondary | ICD-10-CM

## 2021-10-25 HISTORY — DX: Nausea: R11.0

## 2021-10-25 HISTORY — DX: Dysphonia: R49.0

## 2021-10-25 MED ORDER — ONDANSETRON 8 MG PO TBDP: 8.0000 mg | ORAL_TABLET | Freq: Three times a day (TID) | ORAL | 1 refills | Status: DC | PRN

## 2021-10-25 NOTE — Patient Instructions (Addendum)
Take 1/2 tablet for 4 days then stop of propranolol.  ? ?Claritin daily for 2 weeks and see if throat.  ?

## 2021-10-25 NOTE — Progress Notes (Signed)
? ?Subjective:  ? ? Patient ID: Alexander [name], male    DOB: 10-10-1950, 71 y.o.   MRN: [id number] ? ?HPI ?Pt is a 71 yo male who presents to the clinic for nausea and upset stomach since starting propranolol and primidone for his essential tremor. He stopped primadone 3 days ago and has not noticed any improvement of symptoms. He wants to know what to do next.  ? ?He has noticed his voice has been scratchy for the last few weeks. No sinus drainage, sinus pressure, PND, cough, GERD.  ? ?.. ?Active Ambulatory Problems  ?  Diagnosis Date Noted  ? Essential hypertension, benign 09/30/2013  ? Barrett's esophagus 09/30/2013  ? Impaired fasting glucose 09/30/2013  ? BPH (benign prostatic hyperplasia) 09/30/2013  ? Bipolar 1 disorder, mixed ([place]) 09/30/2013  ? Generalized anxiety disorder 09/30/2013  ? Hyperlipidemia 10/03/2013  ? History of diverticulitis 10/20/2013  ? Preventive measure 10/20/2013  ? OSA on CPAP 12/28/2013  ? Osteoarthritis of carpometacarpal joint of left thumb 01/05/2015  ? Ear mass, left 06/06/2016  ? Chronic pain of right knee 03/25/2017  ? Seborrheic keratoses 03/25/2017  ? RLS (restless legs syndrome) 03/25/2017  ? Diarrhea 03/25/2017  ? Hematoma 04/24/2017  ? Lipoma of neck 04/24/2017  ? Acute non-recurrent pansinusitis 08/12/2017  ? Memory changes 08/30/2017  ? Elevated fasting glucose 08/30/2017  ? Reactive airway disease that is not asthma 09/13/2017  ? Bilateral lower extremity edema 09/13/2017  ? Low libido 10/08/2017  ? Irritable bowel syndrome with diarrhea 10/08/2017  ? Irregular heart beat 01/27/2018  ? LVH (left ventricular hypertrophy) 01/27/2018  ? Neck pain 01/27/2018  ? Serum calcium elevated 01/27/2018  ? Loose stools 01/27/2018  ? DDD (degenerative disc disease), cervical 01/29/2018  ? Accessory skin tags 04/28/2018  ? Erectile dysfunction 07/15/2018  ? Cervical spondylosis 09/15/2018  ? Chronic pain disorder 11/01/2018  ? NSAID induced gastritis 01/20/2019  ? Sinus bradycardia  04/04/2019  ? Herniation of cervical intervertebral disc with radiculopathy 04/19/2019  ? Fusion of spine of cervical region 04/19/2019  ? PVC (premature ventricular contraction) 06/15/2019  ? Elevated PTHrP level 12/30/2019  ? Elevated calcitonin level 12/30/2019  ? Benign essential tremor 12/30/2019  ? Arthralgia of multiple joints 12/30/2019  ? Hyperparathyroidism due to lithium therapy ([place]) 03/30/2020  ? Primary osteoarthritis of left knee 04/01/2021  ? Lumbar degenerative disc disease 04/09/2021  ? Bilateral primary osteoarthritis of hip 10/03/2021  ? Nausea 10/25/2021  ? Hoarseness 10/25/2021  ? ?Resolved Ambulatory Problems  ?  Diagnosis Date Noted  ? Need for influenza vaccination 03/25/2017  ? Epidermal cyst 03/25/2017  ? Excessive bleeding 04/24/2017  ? Cough 08/30/2017  ? Vision changes 10/08/2017  ? Elevated bilirubin 01/27/2018  ? Osteoarthritis of facet joint of cervical spine 04/26/2018  ? Cervical radiculopathy 11/01/2018  ? Spinal stenosis of cervical region 11/05/2018  ? Other spondylosis with radiculopathy, cervical region 04/19/2019  ? Status post cervical spinal fusion 03/30/2020  ? ?Past Medical History:  ?Diagnosis Date  ? Bradycardia   ? Diverticulitis 2007  ? GERD (gastroesophageal reflux disease)   ? Sleep apnea   ? ? ? ? ?Review of Systems ? ?  See HPI.  ?Objective:  ? Physical Exam ?Vitals reviewed.  ?Constitutional:   ?   Appearance: Normal appearance. He is obese.  ?HENT:  ?   Head: Normocephalic.  ?Cardiovascular:  ?   Rate and Rhythm: Normal rate and regular rhythm.  ?   Pulses: Normal pulses.  ?Pulmonary:  ?  Effort: Pulmonary effort is normal.  ?   Breath sounds: Normal breath sounds.  ?Abdominal:  ?   General: Bowel sounds are normal. There is no distension.  ?   Palpations: Abdomen is soft. There is no mass.  ?   Tenderness: There is no abdominal tenderness. There is no right CVA tenderness, left CVA tenderness, guarding or rebound.  ?   Hernia: No hernia is present.   ?Neurological:  ?   General: No focal deficit present.  ?   Mental Status: He is alert and oriented to person, place, and time.  ?Psychiatric:     ?   Mood and Affect: Mood normal.  ? ? ? ? ? ?   ?Assessment & Plan:  ?..Alexander [name] was seen today for nausea. ? ?Diagnoses and all orders for this visit: ? ?Nausea ?-     ondansetron (ZOFRAN-ODT) 8 MG disintegrating tablet; Take 1 tablet (8 mg total) by mouth every 8 (eight) hours as needed for nausea. ? ?Benign essential tremor ? ?Hoarseness ? ? ?For nausea sent zofran as needed.  ?Taper off propranolol. 1/2 tablet for 4 days then stop. ?Will call in one week and see if we can add back primidone for tremor.  ?BP looks great.  ?Start claritin for hoarse throat if continues will consider referral to ENT if needed.  ?Follow up in 2 weeks.  ? ?

## 2021-10-26 ENCOUNTER — Other Ambulatory Visit: Payer: Self-pay | Admitting: Physician Assistant

## 2021-10-26 DIAGNOSIS — G250 Essential tremor: Secondary | ICD-10-CM

## 2021-10-28 ENCOUNTER — Ambulatory Visit: Payer: Medicare HMO | Admitting: Physician Assistant

## 2021-10-28 ENCOUNTER — Encounter: Payer: Self-pay | Admitting: Physician Assistant

## 2021-10-28 ENCOUNTER — Telehealth: Payer: Self-pay | Admitting: Neurology

## 2021-10-28 DIAGNOSIS — F3130 Bipolar disorder, current episode depressed, mild or moderate severity, unspecified: Secondary | ICD-10-CM | POA: Diagnosis not present

## 2021-10-28 DIAGNOSIS — R69 Illness, unspecified: Secondary | ICD-10-CM | POA: Diagnosis not present

## 2021-10-28 NOTE — Telephone Encounter (Signed)
Patient came by the office to let us know [facility] did not receive lab results. These have been sent 2x.  ? ?Faxed results again to fax [phone] with confirmation received.  ?

## 2021-11-04 ENCOUNTER — Other Ambulatory Visit: Payer: Self-pay | Admitting: Physician Assistant

## 2021-11-04 DIAGNOSIS — I10 Essential (primary) hypertension: Secondary | ICD-10-CM

## 2021-11-18 DIAGNOSIS — G4733 Obstructive sleep apnea (adult) (pediatric): Secondary | ICD-10-CM | POA: Diagnosis not present

## 2021-11-20 ENCOUNTER — Ambulatory Visit: Payer: Medicare HMO | Admitting: Sports Medicine

## 2021-11-22 ENCOUNTER — Encounter: Payer: Self-pay | Admitting: Physician Assistant

## 2021-11-22 ENCOUNTER — Ambulatory Visit (INDEPENDENT_AMBULATORY_CARE_PROVIDER_SITE_OTHER): Payer: Medicare HMO | Admitting: Physician Assistant

## 2021-11-22 VITALS — BP 129/59 | HR 58 | Ht 70.0 in | Wt 206.0 lb

## 2021-11-22 DIAGNOSIS — R9431 Abnormal electrocardiogram [ECG] [EKG]: Secondary | ICD-10-CM

## 2021-11-22 DIAGNOSIS — R001 Bradycardia, unspecified: Secondary | ICD-10-CM

## 2021-11-22 DIAGNOSIS — R42 Dizziness and giddiness: Secondary | ICD-10-CM

## 2021-11-22 DIAGNOSIS — R432 Parageusia: Secondary | ICD-10-CM

## 2021-11-22 DIAGNOSIS — R0989 Other specified symptoms and signs involving the circulatory and respiratory systems: Secondary | ICD-10-CM

## 2021-11-22 DIAGNOSIS — G250 Essential tremor: Secondary | ICD-10-CM

## 2021-11-22 DIAGNOSIS — I493 Ventricular premature depolarization: Secondary | ICD-10-CM

## 2021-11-22 DIAGNOSIS — R002 Palpitations: Secondary | ICD-10-CM

## 2021-11-22 DIAGNOSIS — M5136 Other intervertebral disc degeneration, lumbar region: Secondary | ICD-10-CM

## 2021-11-22 HISTORY — DX: Other specified symptoms and signs involving the circulatory and respiratory systems: R09.89

## 2021-11-22 HISTORY — DX: Abnormal electrocardiogram (ECG) (EKG): R94.31

## 2021-11-22 HISTORY — DX: Parageusia: R43.2

## 2021-11-22 HISTORY — DX: Palpitations: R00.2

## 2021-11-22 HISTORY — DX: Dizziness and giddiness: R42

## 2021-11-22 MED ORDER — GABAPENTIN 300 MG PO CAPS: 300.0000 mg | ORAL_CAPSULE | Freq: Three times a day (TID) | ORAL | 1 refills | Status: DC

## 2021-11-22 NOTE — Patient Instructions (Addendum)
Stop primadone and if you stop propranolol.  ? ?Under 130/80.  ?Over 100/60.  ? ?Wear compression stockings ?Stay hydrated ?Will get cardiology consult ?Start gabapentin '300mg'$  up to three times a day  ?Stop zofran for nausea due to QT prolongation ? ?Long QT Syndrome ? ?Long QT syndrome (LQTS) is a heart condition in which the heart takes longer than normal to recharge after each heartbeat. This is caused by an abnormal electrical system in the heart. LQTS can upset the timing of your heartbeats. It can cause dangerous changes in your heart rate and rhythm (arrhythmia) that can lead to cardiac arrest. ?You can be born with LQTS, or you can develop it later in life. ?What are the causes? ?The cause of this condition depends on the type of LQTS that you have. ?Inherited LQTS. You are born with this condition. It is caused by an abnormal gene that is passed down through your family. ?Acquired LQTS. You get this condition later in life. It may be caused by: ?Certain medicines that can affect your heartbeat, like antibiotics and antidepressants. ?An electrolyte imbalance. ?Long periods of vomiting or diarrhea. ?A thyroid disorder. ?An eating disorder. ?What increases the risk? ?This condition is more likely to develop in: ?Women. ?People who have an eating disorder, such as anorexia nervosa or bulimia. ?People who have a family member with LQTS. ?People who have a family history of unexplained fainting, drowning, or sudden death. ?What are the signs or symptoms? ?Symptoms of this condition include: ?Fainting. ?A fluttering feeling in your chest. ?Seizures. ?Symptoms of inherited LQTS almost always start before age 60.  ?Some people with this condition have no symptoms. ?How is this diagnosed? ?This condition may be diagnosed based on: ?Your symptoms. ?Your medical history and family history. ?A physical exam. ?Some tests, including: ?An electrocardiogram (ECG) to measure electrical activity in your heart. ?Holter  monitoring to record your heartbeat for 1-2 days. ?A stress test to record your heartbeat while you exercise. ?A blood test to look for genes that cause LQTS. ?How is this treated? ?There is no cure for this condition. Treatment depends on the cause, your symptoms, and whether you have a family history of sudden death. Treatment may include: ?Making lifestyle changes, such as avoiding competitive sports. ?Taking supplements to correct abnormal sodium, potassium, calcium, and magnesium levels. ?Avoiding swimming and diving. ?Taking heart medicines, such as beta blockers. ?Having a device implanted that corrects a dangerous heartbeat called a cardioverter-defibrillator (ICD). This senses a fast heartbeat and shocks the heart to restore normal heart rate. ?Having heart surgery to prevent arrhythmias. ?Follow these instructions at home: ?Medicines ?Take over-the-counter and prescription medicines only as told by your health care provider. ?Before taking any new medicine, get approval from your health care provider first. Avoid any medicines that can cause this condition. ?Lifestyle ?Make lifestyle changes recommended by your health care provider. You may need to avoid: ?Stressful situations. ?Situations where sudden loud noises are likely. ?If you drink alcohol: ?Limit how much you have to: ?0-1 drink a day for women who are not pregnant. ?0-2 drinks a day for men. ?Know how much alcohol is in a drink. In the [place] one drink equals one 12 oz bottle of beer (355 mL), one 5 oz glass of wine (148 mL), or one 1? oz glass of hard liquor (44 mL). ?Do not use any products that contain nicotine or tobacco. These products include cigarettes, chewing tobacco, and vaping devices, such as e-cigarettes. If you need help quitting,  ask your health care provider. ?General instructions ?Develop a plan with your health care provider for how to deal with a sudden arrhythmia. ?Tell people who live with you about the signs of a sudden  arrhythmia. ?Wear a medical ID necklace or bracelet that states your diagnosis and contact information. ?Have an automated external defibrillator (AED) available at home or work. ?Get treatment and support if you feel stress, fear, anxiety, or depression. ?Keep all follow-up visits. This is important. ?Contact a health care provider if: ?You are suffering from stress, fear, anxiety, or depression. ?You have long periods of vomiting or diarrhea. ?Get help right away if: ?You have chest pain or difficulty breathing. ?You have a fluttering feeling in your chest. ?You faint. ?You have a seizure. ?These symptoms may be an emergency. Get help right away. Call 911. ?Do not wait to see if the symptoms will go away. ?Do not drive yourself to the hospital. ?Summary ?Long QT syndrome (LQTS) is a heart condition in which your heart takes longer than normal to recharge after each heartbeat. This is caused by an abnormal electrical system in your heart. ?LQTS can upset the timing of your heartbeats and cause dangerous changes in your heart rate and rhythm. ?Some people are born with LQTS. Others develop it later in life. ?Some people with this condition have no symptoms. Those who do have symptoms may experience fainting, a fluttering feeling in the chest, or seizures. ?There is no cure for this condition. Treatment depends on the cause, your symptoms, and whether you have a family history of sudden death. ?This information is not intended to replace advice given to you by your health care provider. Make sure you discuss any questions you have with your health care provider. ?Document Revised: 06/20/2021 Document Reviewed: 06/20/2021 ?Elsevier Patient Education ? [place]. ? ?

## 2021-11-22 NOTE — Progress Notes (Signed)
? ?Subjective:  ? ? Patient ID: Alexander [name], male    DOB: May 27, 1951, 71 y.o.   MRN: [id number] ? ?HPI ?Pt is a 71 yo male who presents to the clinic with some concerns. For the last few weeks his tremor is much worse. He stopped propranolol but unsure if he is taking primadone or not but it is still on his list. He feels like his heart is pounding at times. He feels dizzy with position changes. The end of his tongue tingles and his taste has changed. No medication or dose changes. He feels very shaky.  ? ?Has been to cardiology before for pre-surgical clearance and no issues were found in the past.  ? ?.. ?Active Ambulatory Problems  ?  Diagnosis Date Noted  ? Essential hypertension, benign 09/30/2013  ? Barrett's esophagus 09/30/2013  ? Impaired fasting glucose 09/30/2013  ? BPH (benign prostatic hyperplasia) 09/30/2013  ? Bipolar 1 disorder, mixed ([place]) 09/30/2013  ? Generalized anxiety disorder 09/30/2013  ? Hyperlipidemia 10/03/2013  ? History of diverticulitis 10/20/2013  ? Preventive measure 10/20/2013  ? OSA on CPAP 12/28/2013  ? Osteoarthritis of carpometacarpal joint of left thumb 01/05/2015  ? Ear mass, left 06/06/2016  ? Chronic pain of right knee 03/25/2017  ? Seborrheic keratoses 03/25/2017  ? RLS (restless legs syndrome) 03/25/2017  ? Diarrhea 03/25/2017  ? Hematoma 04/24/2017  ? Lipoma of neck 04/24/2017  ? Acute non-recurrent pansinusitis 08/12/2017  ? Memory changes 08/30/2017  ? Elevated fasting glucose 08/30/2017  ? Reactive airway disease that is not asthma 09/13/2017  ? Bilateral lower extremity edema 09/13/2017  ? Low libido 10/08/2017  ? Irritable bowel syndrome with diarrhea 10/08/2017  ? Irregular heart beat 01/27/2018  ? LVH (left ventricular hypertrophy) 01/27/2018  ? Neck pain 01/27/2018  ? Serum calcium elevated 01/27/2018  ? Loose stools 01/27/2018  ? DDD (degenerative disc disease), cervical 01/29/2018  ? Accessory skin tags 04/28/2018  ? Erectile dysfunction 07/15/2018  ?  Cervical spondylosis 09/15/2018  ? Chronic pain disorder 11/01/2018  ? NSAID induced gastritis 01/20/2019  ? Sinus bradycardia 04/04/2019  ? Herniation of cervical intervertebral disc with radiculopathy 04/19/2019  ? Fusion of spine of cervical region 04/19/2019  ? PVC (premature ventricular contraction) 06/15/2019  ? Elevated PTHrP level 12/30/2019  ? Elevated calcitonin level 12/30/2019  ? Essential tremor 12/30/2019  ? Arthralgia of multiple joints 12/30/2019  ? Hyperparathyroidism due to lithium therapy ([place]) 03/30/2020  ? Primary osteoarthritis of left knee 04/01/2021  ? Lumbar degenerative disc disease 04/09/2021  ? Bilateral primary osteoarthritis of hip 10/03/2021  ? Nausea 10/25/2021  ? Hoarseness 10/25/2021  ? Prolonged Q-T interval on ECG 11/22/2021  ? Labile blood pressure 11/22/2021  ? Taste impairment 11/22/2021  ? Pounding heartbeat 11/22/2021  ? Dizziness 11/22/2021  ? ?Resolved Ambulatory Problems  ?  Diagnosis Date Noted  ? Need for influenza vaccination 03/25/2017  ? Epidermal cyst 03/25/2017  ? Excessive bleeding 04/24/2017  ? Cough 08/30/2017  ? Vision changes 10/08/2017  ? Elevated bilirubin 01/27/2018  ? Osteoarthritis of facet joint of cervical spine 04/26/2018  ? Cervical radiculopathy 11/01/2018  ? Spinal stenosis of cervical region 11/05/2018  ? Other spondylosis with radiculopathy, cervical region 04/19/2019  ? Status post cervical spinal fusion 03/30/2020  ? ?Past Medical History:  ?Diagnosis Date  ? Bradycardia   ? Diverticulitis 2007  ? GERD (gastroesophageal reflux disease)   ? Sleep apnea   ? ? ? ? ?Review of Systems ? ?  See HPI. ?  Objective:  ? Physical Exam ?Vitals reviewed.  ?Constitutional:   ?   Appearance: He is well-developed. He is obese.  ?HENT:  ?   Head: Normocephalic.  ?Cardiovascular:  ?   Rate and Rhythm: Bradycardia present. Rhythm irregular.  ?   Heart sounds: No murmur heard. ?Pulmonary:  ?   Effort: Pulmonary effort is normal.  ?   Breath sounds: Normal breath  sounds.  ?Lymphadenopathy:  ?   Cervical: Cervical adenopathy present.  ?Neurological:  ?   Mental Status: He is alert.  ?Psychiatric:  ?   Comments: anxious  ? ? ?[name].Orthostatic VS for the past 24 hrs (Last 3 readings): ? BP- Lying Pulse- Lying BP- Sitting Pulse- Sitting BP- Standing at 0 minutes Pulse- Standing at 0 minutes  ?11/22/21 0853 148/62 50 131/61 60 (!) 148/125 60  ? ? ? ? ?   ?Assessment & Plan:  ?..Alexander [name] was seen today for sore throat. ? ?Diagnoses and all orders for this visit: ? ?Pounding heartbeat ?-     COMPLETE METABOLIC PANEL WITH GFR ?-     TSH ?-     CBC w/Diff/Platelet ?-     Magnesium ?-     EKG 12-Lead ?-     Ambulatory referral to Cardiology ? ?Lumbar degenerative disc disease ?-     gabapentin (NEURONTIN) 300 MG capsule; Take 1 capsule (300 mg total) by mouth 3 (three) times daily. ? ?Labile blood pressure ?-     COMPLETE METABOLIC PANEL WITH GFR ?-     TSH ?-     CBC w/Diff/Platelet ?-     Magnesium ?-     EKG 12-Lead ?-     Ambulatory referral to Cardiology ? ?Essential tremor ?-     COMPLETE METABOLIC PANEL WITH GFR ?-     TSH ?-     CBC w/Diff/Platelet ?-     Magnesium ? ?Sinus bradycardia ?-     EKG 12-Lead ? ?PVC (premature ventricular contraction) ?-     EKG 12-Lead ?-     Ambulatory referral to Cardiology ? ?Prolonged Q-T interval on ECG ?-     Ambulatory referral to Cardiology ? ?Taste impairment ? ?Dizziness ? ? ?EKG sinus bradycardia with PVC, prolonged QT, baseline is fuzzy line but suspect due to tremor.  ? ?Re[facility] out to [facility] to let them know about prolonged QT and possible dose adjustment on lithium. Lithium level good 1 month ago and no dose changes. ?Stop zofran for nausea.  ? ?Discussed PVC and labile BP.  ?Will get labs today to look for electrolyte abnormalities ?Referral to cardiology ?Wear compression stocking ?Stay hydrated ?Ok to check BP at home with goal under 130/80  ? ?Start gabapentin for tremor to see if that helps with some of his symptoms ?Stop primadone to  see if any of these feelings are side effects of medication ? ?Spent 40 minutes reviewing chart and discussing medications and coming up with plan.  ? ? ?

## 2021-11-23 LAB — COMPLETE METABOLIC PANEL WITH GFR
AG Ratio: 2 (calc) (ref 1.0–2.5)
ALT: 35 U/L (ref 9–46)
AST: 21 U/L (ref 10–35)
Albumin: 4.5 g/dL (ref 3.6–5.1)
Alkaline phosphatase (APISO): 85 U/L (ref 35–144)
BUN: 14 mg/dL (ref 7–25)
CO2: 27 mmol/L (ref 20–32)
Calcium: 11 mg/dL — ABNORMAL HIGH (ref 8.6–10.3)
Chloride: 105 mmol/L (ref 98–110)
Creat: 1.18 mg/dL (ref 0.70–1.28)
Globulin: 2.2 g/dL (calc) (ref 1.9–3.7)
Glucose, Bld: 113 mg/dL — ABNORMAL HIGH (ref 65–99)
Potassium: 4.4 mmol/L (ref 3.5–5.3)
Sodium: 136 mmol/L (ref 135–146)
Total Bilirubin: 1.4 mg/dL — ABNORMAL HIGH (ref 0.2–1.2)
Total Protein: 6.7 g/dL (ref 6.1–8.1)
eGFR: 66 mL/min/{1.73_m2} (ref >=60)

## 2021-11-23 LAB — CBC WITH DIFFERENTIAL/PLATELET
Absolute Monocytes: 560 cells/uL (ref 200–950)
Basophils Absolute: 64 cells/uL (ref 0–200)
Basophils Relative: 0.8 %
Eosinophils Absolute: 192 cells/uL (ref 15–500)
Eosinophils Relative: 2.4 %
HCT: 41.1 % (ref 38.5–50.0)
Hemoglobin: 14 g/dL (ref 13.2–17.1)
Lymphs Abs: 1384 cells/uL (ref 850–3900)
MCH: 32.3 pg (ref 27.0–33.0)
MCHC: 34.1 g/dL (ref 32.0–36.0)
MCV: 94.9 fL (ref 80.0–100.0)
MPV: 11.4 fL (ref 7.5–12.5)
Monocytes Relative: 7 %
Neutro Abs: 5800 cells/uL (ref 1500–7800)
Neutrophils Relative %: 72.5 %
Platelets: 208 10*3/uL (ref 140–400)
RBC: 4.33 10*6/uL (ref 4.20–5.80)
RDW: 12.9 % (ref 11.0–15.0)
Total Lymphocyte: 17.3 %
WBC: 8 10*3/uL (ref 3.8–10.8)

## 2021-11-23 LAB — MAGNESIUM: Magnesium: 1.9 mg/dL (ref 1.5–2.5)

## 2021-11-23 LAB — TSH: TSH: 2.5 mIU/L (ref 0.40–4.50)

## 2021-11-25 NOTE — Progress Notes (Signed)
Labs look good. Are you doing any better?

## 2021-11-26 DIAGNOSIS — Z79899 Other long term (current) drug therapy: Secondary | ICD-10-CM | POA: Diagnosis not present

## 2021-11-26 DIAGNOSIS — F3130 Bipolar disorder, current episode depressed, mild or moderate severity, unspecified: Secondary | ICD-10-CM | POA: Diagnosis not present

## 2021-11-26 DIAGNOSIS — R69 Illness, unspecified: Secondary | ICD-10-CM | POA: Diagnosis not present

## 2021-12-18 DIAGNOSIS — G4733 Obstructive sleep apnea (adult) (pediatric): Secondary | ICD-10-CM | POA: Diagnosis not present

## 2021-12-20 ENCOUNTER — Ambulatory Visit: Payer: Medicare HMO | Admitting: Physician Assistant

## 2021-12-24 ENCOUNTER — Other Ambulatory Visit: Payer: Self-pay

## 2021-12-24 DIAGNOSIS — K219 Gastro-esophageal reflux disease without esophagitis: Secondary | ICD-10-CM | POA: Insufficient documentation

## 2021-12-25 ENCOUNTER — Other Ambulatory Visit: Payer: Self-pay | Admitting: Physician Assistant

## 2021-12-25 DIAGNOSIS — E782 Mixed hyperlipidemia: Secondary | ICD-10-CM

## 2021-12-30 NOTE — Progress Notes (Unsigned)
Cardiology Office Note:    Date:  12/31/2021   ID:  Alexander [name], DOB 09-Jul-1951, MRN [id number]  PCP:  Donella Stade, PA-C  Cardiologist:  Shirlee More, MD   Referring MD: Donella Stade, PA-C  ASSESSMENT:    1. Palpitations   2. Sinus bradycardia   3. Essential hypertension, benign    PLAN:    In order of problems listed above:  He is improved coincidentally with decreasing lithium can be associated with bradycardia and QT prolongation but not really with arrhythmia PVCs he has a history of previous symptomatic PVCs we will apply 1 week event monitor to assess and decide on any additional treatment Stable I think he is asymptomatic the best way to judge his look at his heart rate response with activities. Stable at target continue treatment including ACE inhibitor alpha-blocker calcium channel blocker avoid rate slowing medications  Next appointment 3 months   Medication Adjustments/Labs and Tests Ordered: Current medicines are reviewed at length with the patient today.  Concerns regarding medicines are outlined above.  No orders of the defined types were placed in this encounter.  No orders of the defined types were placed in this encounter.    Chief Complaint  Patient presents with   Follow-up   Palpitations         History of Present Illness:    Alexander [name] is a 71 y.o. male with a history of hypertension hyperlipidemia obstructive sleep apnea who is being seen today for the evaluation of palpitation at the request of Alden Hipp, [place], PA-C. He was previously seen by me 04/06/2019 and preoperative evaluation with a history of hypertension asymptomatic sinus bradycardia and event monitor showing frequent PVCs.  Lithium therapy has been associated with sinus bradycardia and QT changes.  EKG 11/22/2021 showed sinus rhythm first-degree AV block mildly prolonged QT corrected interval and frequent PVCs.  Laboratory studies showed a normal magnesium sodium  potassium 136 and 4.4 hemoglobin 14.0 and TSH 2.50.  Recently he did not feel well he had weakness lightheadedness when his shift posterior his heart was beating forcefully few weeks ago his lithium was decreased and he feels very well now the symptoms have resolved He shows me a list of blood pressures all the run predominately 1 83-6 40 systolic He does not record his heart rates He has not had syncope edema shortness of breath Past Medical History:  Diagnosis Date   Accessory skin tags 04/28/2018   Acute non-recurrent pansinusitis 08/12/2017   Arthralgia of multiple joints 12/30/2019   Barrett's esophagus 09/30/2013   Bilateral lower extremity edema 09/13/2017   Bilateral primary osteoarthritis of hip 10/03/2021   Bipolar 1 disorder, mixed ([place]) 09/30/2013   [place], Dr. Ardine Eng    BPH (benign prostatic hyperplasia) 09/30/2013   Cervical spondylosis 09/15/2018   Chronic pain disorder 11/01/2018   Chronic pain of right knee 03/25/2017   DDD (degenerative disc disease), cervical 01/29/2018   Diarrhea 03/25/2017   Diverticulitis 2007   Dizziness 11/22/2021   Ear mass, left 06/06/2016   Biopsy- Actinic keratosis via biopsy.    Elevated calcitonin level 12/30/2019   Elevated fasting glucose 08/30/2017   Elevated PTHrP level 12/30/2019   Erectile dysfunction 07/15/2018   Essential hypertension, benign 09/30/2013   Essential tremor 12/30/2019   Fusion of spine of cervical region 04/19/2019   Generalized anxiety disorder 09/30/2013   GERD (gastroesophageal reflux disease)    Hematoma 04/24/2017   Herniation of cervical intervertebral disc with radiculopathy  04/19/2019   C3-4   History of diverticulitis 10/20/2013   With colonectomy    Hoarseness 10/25/2021   Hyperlipidemia 10/03/2013   AHA 10 year risk of 19.5% Feb 2015, encouraged to start Lipitor   Hyperparathyroidism due to lithium therapy ([place]) 03/30/2020   Per endocrinology as long as he has no sequela and calcium is '1mg'$ /dL from upper limits  of normal ok to stay on lithium. No extra calcium.    Impaired fasting glucose 09/30/2013   Irregular heart beat 01/27/2018   Irritable bowel syndrome with diarrhea 10/08/2017   Labile blood pressure 11/22/2021   Lipoma of neck 04/24/2017   Loose stools 01/27/2018   Low libido 10/08/2017   Lumbar degenerative disc disease 04/09/2021   LVH (left ventricular hypertrophy) 01/27/2018   Memory changes 08/30/2017   Nausea 10/25/2021   Neck pain 01/27/2018   NSAID induced gastritis 01/20/2019   OSA on CPAP 12/28/2013   Osteoarthritis of carpometacarpal joint of left thumb 01/05/2015   Pounding heartbeat 11/22/2021   Preventive measure 10/20/2013   Colonoscopy & EGD 2014 [facility] - Repeat EGD 2017, Colon 2019   Primary osteoarthritis of left knee 04/01/2021   Prolonged Q-T interval on ECG 11/22/2021   PVC (premature ventricular contraction) 06/15/2019   Reactive airway disease that is not asthma 09/13/2017   RLS (restless legs syndrome) 03/25/2017   Seborrheic keratoses 03/25/2017   Serum calcium elevated 01/27/2018   Sinus bradycardia 04/04/2019   Taste impairment 11/22/2021    Past Surgical History:  Procedure Laterality Date   ANTERIOR CERVICAL DECOMP/DISCECTOMY FUSION N/A 04/19/2019   Procedure: ANTERIOR CERVICAL DISCECTOMY FUSION cervical three to four, cervical four to five.;  Surgeon: Jessy Oto, MD;  Location: [facility];  Service: Orthopedics;  Laterality: N/A;   COLOSTOMY REVERSAL     large bowel perforation      Current Medications: Current Meds  Medication Sig   AMBULATORY NON FORMULARY MEDICATION Increase Auto-PAP machine with heated humidifier settings to 9-20cm H2O.  Dx: Obstructive sleep apnea.   amLODipine (NORVASC) 10 MG tablet Take 1 tablet (10 mg total) by mouth daily.   aspirin 81 MG tablet Take 81 mg by mouth every other day.   atorvastatin (LIPITOR) 20 MG tablet TAKE 1 TABLET BY MOUTH EVERY DAY   benazepril (LOTENSIN) 20 MG tablet TAKE 1 TABLET BY MOUTH EVERY DAY   celecoxib  (CELEBREX) 200 MG capsule Take one tablet twice a day.   cholecalciferol (VITAMIN D3) 25 MCG (1000 UNIT) tablet Take 1,000 Units by mouth in the morning and at bedtime.   doxazosin (CARDURA) 4 MG tablet TAKE 1 TABLET BY MOUTH 2 TIMES DAILY.   gabapentin (NEURONTIN) 300 MG capsule Take 1 capsule (300 mg total) by mouth 3 (three) times daily.   lithium carbonate (ESKALITH) 450 MG CR tablet Take 450 mg by mouth daily.   Omega-3 Fatty Acids (FISH OIL) 1000 MG CAPS Take 1,000 mg by mouth daily.   pantoprazole (PROTONIX) 40 MG tablet TAKE 1 TABLET BY MOUTH TWICE A DAY   sertraline (ZOLOFT) 100 MG tablet Take 100 mg by mouth daily.     Allergies:   Effexor [venlafaxine]   Social History   Socioeconomic History   Marital status: Widowed    Spouse name: Not on file   Number of children: 1   Years of education: 18   Highest education level: Some college, no degree  Occupational History    Comment: Retired  Tobacco Use   Smoking status: Former  Types: Cigarettes    Quit date: 10/01/1983    Years since quitting: 38.2   Smokeless tobacco: Never  Substance and Sexual Activity   Alcohol use: Yes    Alcohol/week: 2.0 - 4.0 standard drinks    Types: 2 - 4 Cans of beer per week    Comment: daily   Drug use: No   Sexual activity: Not Currently    Partners: Female  Other Topics Concern   Not on file  Social History Narrative   Lives alone. His daughter lives close by. Likes to collect things and build things.   Social Determinants of Health   Financial Resource Strain: Low Risk    Difficulty of Paying Living Expenses: Not hard at all  Food Insecurity: No Food Insecurity   Worried About Charity fundraiser in the Last Year: Never true   [place] in the Last Year: Never true  Transportation Needs: No Transportation Needs   Lack of Transportation (Medical): No   Lack of Transportation (Non-Medical): No  Physical Activity: Inactive   Days of Exercise per Week: 0 days   Minutes of  Exercise per Session: 0 min  Stress: No Stress Concern Present   Feeling of Stress : Not at all  Social Connections: Socially Isolated   Frequency of Communication with Friends and Family: Once a week   Frequency of Social Gatherings with Friends and Family: Never   Attends Religious Services: Never   Marine scientist or Organizations: No   Attends Archivist Meetings: Never   Marital Status: Widowed     Family History: The patient's family history includes Hypertension in his brother and sister. There is no history of CAD, Diabetes, Heart disease, or Cancer.  ROS:   ROS Please see the history of present illness.     All other systems reviewed and are negative.  EKGs/Labs/Other Studies Reviewed:    The following studies were reviewed today: EKG shows sinus bradycardia 43 bpm normal QT interval nonspecific T waves  Recent Labs: 11/22/2021: ALT 35; BUN 14; Creat 1.18; Hemoglobin 14.0; Magnesium 1.9; Platelets 208; Potassium 4.4; Sodium 136; TSH 2.50  Recent Lipid Panel    Component Value Date/Time   CHOL 151 10/01/2021 0000   TRIG 180 (H) 10/01/2021 0000   HDL 55 10/01/2021 0000   CHOLHDL 2.7 10/01/2021 0000   VLDL 27 07/25/2016 0938   LDLCALC 70 10/01/2021 0000    Physical Exam:    VS:  BP (!) 150/64   Pulse (!) 43   Ht '5\' 10"'$  (1.778 m)   Wt 214 lb 1.9 oz (97.1 kg)   SpO2 96%   BMI 30.72 kg/m     Wt Readings from Last 3 Encounters:  12/31/21 214 lb 1.9 oz (97.1 kg)  11/22/21 206 lb (93.4 kg)  10/25/21 203 lb 0.6 oz (92.1 kg)     GEN:  Well nourished, well developed in no acute distress HEENT: Normal NECK: No JVD; No carotid bruits LYMPHATICS: No lymphadenopathy CARDIAC: RRR, no murmurs, rubs, gallops RESPIRATORY:  Clear to auscultation without rales, wheezing or rhonchi  ABDOMEN: Soft, non-tender, non-distended MUSCULOSKELETAL:  No edema; No deformity  SKIN: Warm and dry NEUROLOGIC:  Alert and oriented x 3 PSYCHIATRIC:  Normal affect      Signed, Shirlee More, MD  12/31/2021 3:05 PM    [facility]

## 2021-12-31 ENCOUNTER — Ambulatory Visit: Payer: Medicare HMO | Admitting: Cardiology

## 2021-12-31 ENCOUNTER — Encounter: Payer: Self-pay | Admitting: Cardiology

## 2021-12-31 ENCOUNTER — Ambulatory Visit (INDEPENDENT_AMBULATORY_CARE_PROVIDER_SITE_OTHER): Payer: Medicare HMO

## 2021-12-31 VITALS — BP 150/64 | HR 43 | Ht 70.0 in | Wt 214.1 lb

## 2021-12-31 DIAGNOSIS — I10 Essential (primary) hypertension: Secondary | ICD-10-CM

## 2021-12-31 DIAGNOSIS — R001 Bradycardia, unspecified: Secondary | ICD-10-CM | POA: Diagnosis not present

## 2021-12-31 DIAGNOSIS — R002 Palpitations: Secondary | ICD-10-CM

## 2021-12-31 NOTE — Patient Instructions (Signed)
Medication Instructions:  Your physician recommends that you continue on your current medications as directed. Please refer to the Current Medication list given to you today.  *If you need a refill on your cardiac medications before your next appointment, please call your pharmacy*   Lab Work: None If you have labs (blood work) drawn today and your tests are completely normal, you will receive your results only by: [place] (if you have MyChart) OR A paper copy in the mail If you have any lab test that is abnormal or we need to change your treatment, we will call you to review the results.   Testing/Procedures: A zio monitor was ordered today. It will remain on for 7 days. You will then return monitor and event diary in provided box. It takes 1-2 weeks for report to be downloaded and returned to [place]. We will call you with the results. If monitor falls off or has orange flashing light, please call Zio for further instructions.     Follow-Up: At [facility], you and your health needs are our priority.  As part of our continuing mission to provide you with exceptional heart care, we have created designated Provider Care Teams.  These Care Teams include your primary Cardiologist (physician) and Advanced Practice Providers (APPs -  Physician Assistants and Nurse Practitioners) who all work together to provide you with the care you need, when you need it.  We recommend signing up for the patient portal called "MyChart".  Sign up information is provided on this After Visit Summary.  MyChart is used to connect with patients for Virtual Visits (Telemedicine).  Patients are able to view lab/test results, encounter notes, upcoming appointments, etc.  Non-urgent messages can be sent to your provider as well.   To learn more about what you can do with MyChart, go to [email or web address].    Your next appointment:   3 month(s)  The format for your next appointment:   In Person  Provider:    Shirlee More, MD    Other Instructions None  Important Information About Sugar

## 2021-12-31 NOTE — Addendum Note (Signed)
Addended by: Edwyna Shell I on: 12/31/2021 03:27 PM   Modules accepted: Orders

## 2022-01-02 ENCOUNTER — Ambulatory Visit (INDEPENDENT_AMBULATORY_CARE_PROVIDER_SITE_OTHER): Payer: Medicare HMO | Admitting: Physician Assistant

## 2022-01-02 ENCOUNTER — Encounter: Payer: Self-pay | Admitting: Physician Assistant

## 2022-01-02 VITALS — BP 123/54 | HR 84 | Ht 70.0 in | Wt 213.0 lb

## 2022-01-02 DIAGNOSIS — R0989 Other specified symptoms and signs involving the circulatory and respiratory systems: Secondary | ICD-10-CM | POA: Diagnosis not present

## 2022-01-02 DIAGNOSIS — R9431 Abnormal electrocardiogram [ECG] [EKG]: Secondary | ICD-10-CM

## 2022-01-02 DIAGNOSIS — G250 Essential tremor: Secondary | ICD-10-CM

## 2022-01-02 DIAGNOSIS — R002 Palpitations: Secondary | ICD-10-CM | POA: Diagnosis not present

## 2022-01-02 DIAGNOSIS — M5136 Other intervertebral disc degeneration, lumbar region: Secondary | ICD-10-CM | POA: Diagnosis not present

## 2022-01-02 MED ORDER — GABAPENTIN 300 MG PO CAPS: 300.0000 mg | ORAL_CAPSULE | Freq: Three times a day (TID) | ORAL | 1 refills | Status: DC

## 2022-01-02 NOTE — Progress Notes (Signed)
Established Patient Office Visit  Subjective   Patient ID: Alexander [name], male    DOB: January 27, 1951  Age: 71 y.o. MRN: [id number]  Chief Complaint  Patient presents with   Follow-up    HPI Pt is a 71 yo male with Bipolar, HTN, Palpitations, QT prolongation, Essential tremor and lumbar DDD who presents to the clinic for follow up.   Last visit he was referred to cardiology and [place] due to QT prolongation, HTN, palpitations. I increased gabapentin to TID and stopped primadone. BH decreased lithium dosage. He is doing much better. No palpitations. Tremor is controlled. He is wearing a zio patch from cardiology.   .. Active Ambulatory Problems    Diagnosis Date Noted   Essential hypertension, benign 09/30/2013   Barrett's esophagus 09/30/2013   Impaired fasting glucose 09/30/2013   BPH (benign prostatic hyperplasia) 09/30/2013   Bipolar 1 disorder, mixed ([place]) 09/30/2013   Generalized anxiety disorder 09/30/2013   Hyperlipidemia 10/03/2013   History of diverticulitis 10/20/2013   Preventive measure 10/20/2013   OSA on CPAP 12/28/2013   Osteoarthritis of carpometacarpal joint of left thumb 01/05/2015   Ear mass, left 06/06/2016   Chronic pain of right knee 03/25/2017   Seborrheic keratoses 03/25/2017   RLS (restless legs syndrome) 03/25/2017   Diarrhea 03/25/2017   Hematoma 04/24/2017   Lipoma of neck 04/24/2017   Acute non-recurrent pansinusitis 08/12/2017   Memory changes 08/30/2017   Elevated fasting glucose 08/30/2017   Reactive airway disease that is not asthma 09/13/2017   Bilateral lower extremity edema 09/13/2017   Low libido 10/08/2017   Irritable bowel syndrome with diarrhea 10/08/2017   Irregular heart beat 01/27/2018   LVH (left ventricular hypertrophy) 01/27/2018   Neck pain 01/27/2018   Serum calcium elevated 01/27/2018   Loose stools 01/27/2018   DDD (degenerative disc disease), cervical 01/29/2018   Accessory skin tags 04/28/2018   Erectile dysfunction  07/15/2018   Cervical spondylosis 09/15/2018   Chronic pain disorder 11/01/2018   NSAID induced gastritis 01/20/2019   Sinus bradycardia 04/04/2019   Herniation of cervical intervertebral disc with radiculopathy 04/19/2019   Fusion of spine of cervical region 04/19/2019   PVC (premature ventricular contraction) 06/15/2019   Elevated PTHrP level 12/30/2019   Elevated calcitonin level 12/30/2019   Essential tremor 12/30/2019   Arthralgia of multiple joints 12/30/2019   Hyperparathyroidism due to lithium therapy ([place]) 03/30/2020   Primary osteoarthritis of left knee 04/01/2021   Lumbar degenerative disc disease 04/09/2021   Bilateral primary osteoarthritis of hip 10/03/2021   Nausea 10/25/2021   Hoarseness 10/25/2021   Prolonged Q-T interval on ECG 11/22/2021   Labile blood pressure 11/22/2021   Taste impairment 11/22/2021   Palpitations 11/22/2021   Dizziness 11/22/2021   Diverticulitis 2007   GERD (gastroesophageal reflux disease) 12/24/2021   Resolved Ambulatory Problems    Diagnosis Date Noted   Need for influenza vaccination 03/25/2017   Epidermal cyst 03/25/2017   Excessive bleeding 04/24/2017   Cough 08/30/2017   Vision changes 10/08/2017   Elevated bilirubin 01/27/2018   Osteoarthritis of facet joint of cervical spine 04/26/2018   Cervical radiculopathy 11/01/2018   Spinal stenosis of cervical region 11/05/2018   Other spondylosis with radiculopathy, cervical region 04/19/2019   Status post cervical spinal fusion 03/30/2020   Past Medical History:  Diagnosis Date   Pounding heartbeat 11/22/2021     ROS See HPI.    Objective:     BP (!) 123/54   Pulse 84   Ht 5'  10" (1.778 m)   Wt 213 lb (96.6 kg)   SpO2 98%   BMI 30.56 kg/m  BP Readings from Last 3 Encounters:  01/02/22 (!) 123/54  12/31/21 (!) 150/64  11/22/21 (!) 129/59   Wt Readings from Last 3 Encounters:  01/02/22 213 lb (96.6 kg)  12/31/21 214 lb 1.9 oz (97.1 kg)  11/22/21 206 lb (93.4 kg)       Physical Exam Vitals reviewed.  Constitutional:      Appearance: Normal appearance. He is obese.  HENT:     Head: Normocephalic.  Cardiovascular:     Rate and Rhythm: Regular rhythm. Tachycardia present.     Pulses: Normal pulses.     Heart sounds: Normal heart sounds.  Pulmonary:     Effort: Pulmonary effort is normal.  Neurological:     General: No focal deficit present.     Mental Status: He is alert and oriented to person, place, and time.  Psychiatric:        Mood and Affect: Mood normal.         Assessment & Plan:  [name] KitchenMarland KitchenJaziah Goeller was seen today for follow-up.  Diagnoses and all orders for this visit:  Essential tremor -     gabapentin (NEURONTIN) 300 MG capsule; Take 1 capsule (300 mg total) by mouth 3 (three) times daily.  Lumbar degenerative disc disease -     gabapentin (NEURONTIN) 300 MG capsule; Take 1 capsule (300 mg total) by mouth 3 (three) times daily.  Labile blood pressure  Prolonged Q-T interval on ECG  Palpitations   BP looks great today. Pt is checking at home and staying in the 120s over 70s.  Continue on same medications Lithium decrease has improved a lot of his symptoms continue with [facility] management.  Gabapentin TID is helping with lumbar DDD pain and tremor, continue with refill sent. Cardiology will report his readings from zio patch Follow up in 3 months.   Return in about 3 months (around 04/04/2022).    Iran Planas, PA-C

## 2022-01-09 DIAGNOSIS — R002 Palpitations: Secondary | ICD-10-CM | POA: Diagnosis not present

## 2022-01-09 DIAGNOSIS — R001 Bradycardia, unspecified: Secondary | ICD-10-CM | POA: Diagnosis not present

## 2022-01-09 DIAGNOSIS — I10 Essential (primary) hypertension: Secondary | ICD-10-CM | POA: Diagnosis not present

## 2022-01-16 DIAGNOSIS — G4733 Obstructive sleep apnea (adult) (pediatric): Secondary | ICD-10-CM | POA: Diagnosis not present

## 2022-01-18 DIAGNOSIS — G4733 Obstructive sleep apnea (adult) (pediatric): Secondary | ICD-10-CM | POA: Diagnosis not present

## 2022-01-21 ENCOUNTER — Other Ambulatory Visit: Payer: Self-pay

## 2022-01-21 DIAGNOSIS — R001 Bradycardia, unspecified: Secondary | ICD-10-CM

## 2022-01-22 ENCOUNTER — Ambulatory Visit (INDEPENDENT_AMBULATORY_CARE_PROVIDER_SITE_OTHER): Payer: Medicare HMO

## 2022-01-22 ENCOUNTER — Encounter: Payer: Self-pay | Admitting: Sports Medicine

## 2022-01-22 ENCOUNTER — Ambulatory Visit (INDEPENDENT_AMBULATORY_CARE_PROVIDER_SITE_OTHER): Payer: Medicare HMO | Admitting: Sports Medicine

## 2022-01-22 DIAGNOSIS — M50322 Other cervical disc degeneration at C5-C6 level: Secondary | ICD-10-CM | POA: Diagnosis not present

## 2022-01-22 DIAGNOSIS — M47812 Spondylosis without myelopathy or radiculopathy, cervical region: Secondary | ICD-10-CM

## 2022-01-22 DIAGNOSIS — M50323 Other cervical disc degeneration at C6-C7 level: Secondary | ICD-10-CM | POA: Diagnosis not present

## 2022-01-22 NOTE — Assessment & Plan Note (Signed)
Pleasant 71 year old male, he is post C3-C5 ACDF, historically has done well, he does have some adjacent level disease C5-C6 and C6-C7. We treated him back in March with some Decadron, Celebrex, he did declined prednisone. Recently he was at his home workshop, and a heavy object fell on the back of his neck. On exam he does have a scrape on the back of his neck, minimal tenderness bilaterally mid cervical spine. He does have good motion and nothing overtly radicular. Adding x-rays, no medications, home conditioning. Return to see me as needed.

## 2022-01-22 NOTE — Progress Notes (Signed)
    Procedures performed today:    None.  Independent interpretation of notes and tests performed by another provider:   None.  Brief History, Exam, Impression, and Recommendations:    Cervical spondylosis Pleasant 71 year old male, he is post C3-C5 ACDF, historically has done well, he does have some adjacent level disease C5-C6 and C6-C7. We treated him back in March with some Decadron, Celebrex, he did declined prednisone. Recently he was at his home workshop, and a heavy object fell on the back of his neck. On exam he does have a scrape on the back of his neck, minimal tenderness bilaterally mid cervical spine. He does have good motion and nothing overtly radicular. Adding x-rays, no medications, home conditioning. Return to see me as needed.    ___________________________________________ Gwen Her. Dianah Field, M.D., ABFM., CAQSM. Primary Care and [place] Instructor of [facility]

## 2022-02-05 ENCOUNTER — Telehealth: Payer: Self-pay | Admitting: Physician Assistant

## 2022-02-05 ENCOUNTER — Other Ambulatory Visit: Payer: Self-pay | Admitting: Physician Assistant

## 2022-02-05 DIAGNOSIS — M47812 Spondylosis without myelopathy or radiculopathy, cervical region: Secondary | ICD-10-CM

## 2022-02-05 DIAGNOSIS — I10 Essential (primary) hypertension: Secondary | ICD-10-CM

## 2022-02-05 NOTE — Telephone Encounter (Signed)
Referral has been placed. 

## 2022-02-05 NOTE — Telephone Encounter (Signed)
Patient called and stated he was not happy with the answer Dr. Darene Lamer gave him about his neck. He stated he wasn't saying he was wrong but he just was not happy and would like to be referred to someone for a second opinion.  He does want to stay in the [facility].  Please advise.   Jenny Reichmann

## 2022-02-06 ENCOUNTER — Ambulatory Visit: Payer: Self-pay

## 2022-02-06 ENCOUNTER — Encounter: Payer: Self-pay | Admitting: Specialist

## 2022-02-06 ENCOUNTER — Ambulatory Visit: Payer: Medicare HMO | Admitting: Specialist

## 2022-02-06 VITALS — BP 158/71 | HR 49 | Ht 70.0 in | Wt 213.0 lb

## 2022-02-06 DIAGNOSIS — M4722 Other spondylosis with radiculopathy, cervical region: Secondary | ICD-10-CM

## 2022-02-06 DIAGNOSIS — M4322 Fusion of spine, cervical region: Secondary | ICD-10-CM | POA: Diagnosis not present

## 2022-02-06 DIAGNOSIS — S129XXS Fracture of neck, unspecified, sequela: Secondary | ICD-10-CM

## 2022-02-06 MED ORDER — METAXALONE 800 MG PO TABS: 800.0000 mg | ORAL_TABLET | Freq: Three times a day (TID) | ORAL | 1 refills | Status: DC

## 2022-02-06 MED ORDER — ACETAMINOPHEN-CODEINE 300-30 MG PO TABS: 1.0000 | ORAL_TABLET | Freq: Four times a day (QID) | ORAL | 0 refills | Status: DC | PRN

## 2022-02-06 MED ORDER — METHYLPREDNISOLONE 4 MG PO TBPK: ORAL_TABLET | ORAL | 0 refills | Status: DC

## 2022-02-06 NOTE — Patient Instructions (Signed)
Plan: Avoid overhead lifting and overhead use of the arms. Do not lift greater than 5 lbs. Adjust head rest in vehicle to prevent hyperextension if rear ended. Take extra precautions to avoid falling. Over the door cervical traction unit with 5-10 lbs 10-15 min 3-4 times per day.  Follow-Up Instructions: Return in about 3 weeks (around 02/27/2022).

## 2022-02-06 NOTE — Progress Notes (Signed)
Office Visit Note   Patient: Alexander [name]           Date of Birth: 1951/07/23           MRN: [id number] Visit Date: 02/06/2022              Requested by: Donella Stade, PA-C [place],  [place] [address] PCP: Donella Stade, PA-C   Assessment & Plan: Visit Diagnoses:  1. Fusion of spine of cervical region   2. Other spondylosis with radiculopathy, cervical region   3. Closed fracture of spinous process of cervical vertebra, sequela     Plan: Avoid overhead lifting and overhead use of the arms. Do not lift greater than 5 lbs. Adjust head rest in vehicle to prevent hyperextension if rear ended. Take extra precautions to avoid falling. Over the door cervical traction unit with 5-10 lbs 10-15 min 3-4 times per day.  Follow-Up Instructions: Return in about 3 weeks (around 02/27/2022).   Orders:  Orders Placed This Encounter  Procedures   XR Cervical Spine 2 or 3 views   Meds ordered this encounter  Medications   methylPREDNISolone (MEDROL DOSEPAK) 4 MG TBPK tablet    Sig: Take as directed, 6 day dose pak    Dispense:  21 tablet    Refill:  0   metaxalone (SKELAXIN) 800 MG tablet    Sig: Take 1 tablet (800 mg total) by mouth 3 (three) times daily.    Dispense:  15 tablet    Refill:  1   acetaminophen-codeine (TYLENOL #3) 300-30 MG tablet    Sig: Take 1 tablet by mouth every 6 (six) hours as needed for moderate pain.    Dispense:  30 tablet    Refill:  0      Procedures: No procedures performed   Clinical Data: No additional findings.   Subjective: Chief Complaint  Patient presents with   Neck - Pain    71 year old right handed male 3 years post op C3-4 and C4-5 ACDF for spondylosis with adjacent level DDD and spondylosis. 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 No bowel or bladder difficult.   Review of Systems  Constitutional: Negative.   HENT: Negative.    Eyes: Negative.   Respiratory: Negative.    Cardiovascular: Negative.   Gastrointestinal: Negative.   Endocrine: Negative.   Genitourinary: Negative.   Musculoskeletal: Negative.   Skin: Negative.   Allergic/Immunologic: Negative.   Neurological: Negative.   Hematological: Negative.   Psychiatric/Behavioral: Negative.       Objective: Vital Signs: BP (!) 158/71 (BP Location: Left Arm, Patient Position: Sitting)   Pulse (!) 49   Ht '5\' 10"'$  (1.778 m)   Wt 213 lb (96.6 kg)   BMI 30.56 kg/m   Physical Exam Constitutional:      Appearance: He is well-developed.  HENT:     Head: Normocephalic and atraumatic.  Eyes:     Pupils: Pupils are equal, round, and reactive to light.  Pulmonary:     Effort: Pulmonary effort is normal.     Breath sounds: Normal breath sounds.  Abdominal:     General: Bowel sounds are normal.     Palpations: Abdomen is soft.  Musculoskeletal:  Cervical back: Normal range of motion and neck supple.     Lumbar back: Negative right straight leg raise test and negative left straight leg raise test.  Skin:    General: Skin is warm and dry.  Neurological:     Mental Status: He is alert and oriented to person, place, and time.  Psychiatric:        Behavior: Behavior normal.        Thought Content: Thought content normal.        Judgment: Judgment normal.    Back Exam   Tenderness  The patient is experiencing tenderness in the cervical.  Range of Motion  Extension:  abnormal  Flexion:  abnormal  Lateral bend right:  abnormal  Rotation right:  abnormal   Muscle Strength  Right Quadriceps:  5/5  Left Quadriceps:  5/5  Right Hamstrings:  5/5  Left Hamstrings:  5/5   Tests  Straight leg raise right: negative Straight leg raise left: negative     Specialty Comments:  No specialty comments  available.  Imaging: No results found.   PMFS History: Patient Active Problem List   Diagnosis Date Noted   Herniation of cervical intervertebral disc with radiculopathy 04/19/2019    Priority: High    Class: Chronic   GERD (gastroesophageal reflux disease) 12/24/2021   Prolonged Q-T interval on ECG 11/22/2021   Labile blood pressure 11/22/2021   Taste impairment 11/22/2021   Palpitations 11/22/2021   Dizziness 11/22/2021   Nausea 10/25/2021   Hoarseness 10/25/2021   Bilateral primary osteoarthritis of hip 10/03/2021   Lumbar degenerative disc disease 04/09/2021   Primary osteoarthritis of left knee 04/01/2021   Hyperparathyroidism due to lithium therapy ([place]) 03/30/2020   Elevated PTHrP level 12/30/2019   Elevated calcitonin level 12/30/2019   Essential tremor 12/30/2019   Arthralgia of multiple joints 12/30/2019   PVC (premature ventricular contraction) 06/15/2019   Fusion of spine of cervical region 04/19/2019   Sinus bradycardia 04/04/2019   NSAID induced gastritis 01/20/2019   Chronic pain disorder 11/01/2018   Cervical spondylosis 09/15/2018   Erectile dysfunction 07/15/2018   Accessory skin tags 04/28/2018   DDD (degenerative disc disease), cervical 01/29/2018   Irregular heart beat 01/27/2018   LVH (left ventricular hypertrophy) 01/27/2018   Neck pain 01/27/2018   Serum calcium elevated 01/27/2018   Loose stools 01/27/2018   Low libido 10/08/2017   Irritable bowel syndrome with diarrhea 10/08/2017   Reactive airway disease that is not asthma 09/13/2017   Bilateral lower extremity edema 09/13/2017   Memory changes 08/30/2017   Elevated fasting glucose 08/30/2017   Acute non-recurrent pansinusitis 08/12/2017   Hematoma 04/24/2017   Lipoma of neck 04/24/2017   Chronic pain of right knee 03/25/2017   Seborrheic keratoses 03/25/2017   RLS (restless legs syndrome) 03/25/2017   Diarrhea 03/25/2017   Ear mass, left 06/06/2016   Osteoarthritis of carpometacarpal  joint of left thumb 01/05/2015   OSA on CPAP 12/28/2013   History of diverticulitis 10/20/2013   Preventive measure 10/20/2013   Hyperlipidemia 10/03/2013   Essential hypertension, benign 09/30/2013   Barrett's esophagus 09/30/2013   Impaired fasting glucose 09/30/2013   BPH (benign prostatic hyperplasia) 09/30/2013   Bipolar 1 disorder, mixed ([place]) 09/30/2013   Generalized anxiety disorder 09/30/2013   Diverticulitis 2007   Past Medical History:  Diagnosis Date   Accessory skin tags 04/28/2018   Acute non-recurrent pansinusitis 08/12/2017   Arthralgia of multiple joints 12/30/2019   Barrett's esophagus 09/30/2013   Bilateral lower  extremity edema 09/13/2017   Bilateral primary osteoarthritis of hip 10/03/2021   Bipolar 1 disorder, mixed ([place]) 09/30/2013   [facility], Dr. Ardine Eng    BPH (benign prostatic hyperplasia) 09/30/2013   Cervical spondylosis 09/15/2018   Chronic pain disorder 11/01/2018   Chronic pain of right knee 03/25/2017   DDD (degenerative disc disease), cervical 01/29/2018   Diarrhea 03/25/2017   Diverticulitis 2007   Dizziness 11/22/2021   Ear mass, left 06/06/2016   Biopsy- Actinic keratosis via biopsy.    Elevated calcitonin level 12/30/2019   Elevated fasting glucose 08/30/2017   Elevated PTHrP level 12/30/2019   Erectile dysfunction 07/15/2018   Essential hypertension, benign 09/30/2013   Essential tremor 12/30/2019   Fusion of spine of cervical region 04/19/2019   Generalized anxiety disorder 09/30/2013   GERD (gastroesophageal reflux disease)    Hematoma 04/24/2017   Herniation of cervical intervertebral disc with radiculopathy 04/19/2019   C3-4   History of diverticulitis 10/20/2013   With colonectomy    Hoarseness 10/25/2021   Hyperlipidemia 10/03/2013   AHA 10 year risk of 19.5% Feb 2015, encouraged to start Lipitor   Hyperparathyroidism due to lithium therapy ([place]) 03/30/2020   Per endocrinology as long as he has no sequela and calcium is '1mg'$ /dL from upper limits  of normal ok to stay on lithium. No extra calcium.    Impaired fasting glucose 09/30/2013   Irregular heart beat 01/27/2018   Irritable bowel syndrome with diarrhea 10/08/2017   Labile blood pressure 11/22/2021   Lipoma of neck 04/24/2017   Loose stools 01/27/2018   Low libido 10/08/2017   Lumbar degenerative disc disease 04/09/2021   LVH (left ventricular hypertrophy) 01/27/2018   Memory changes 08/30/2017   Nausea 10/25/2021   Neck pain 01/27/2018   NSAID induced gastritis 01/20/2019   OSA on CPAP 12/28/2013   Osteoarthritis of carpometacarpal joint of left thumb 01/05/2015   Pounding heartbeat 11/22/2021   Preventive measure 10/20/2013   Colonoscopy & EGD 2014 [facility] - Repeat EGD 2017, Colon 2019   Primary osteoarthritis of left knee 04/01/2021   Prolonged Q-T interval on ECG 11/22/2021   PVC (premature ventricular contraction) 06/15/2019   Reactive airway disease that is not asthma 09/13/2017   RLS (restless legs syndrome) 03/25/2017   Seborrheic keratoses 03/25/2017   Serum calcium elevated 01/27/2018   Sinus bradycardia 04/04/2019   Taste impairment 11/22/2021    Family History  Problem Relation Age of Onset   Hypertension Sister    Hypertension Brother    CAD Neg Hx    Diabetes Neg Hx    Heart disease Neg Hx    Cancer Neg Hx     Past Surgical History:  Procedure Laterality Date   ANTERIOR CERVICAL DECOMP/DISCECTOMY FUSION N/A 04/19/2019   Procedure: ANTERIOR CERVICAL DISCECTOMY FUSION cervical three to four, cervical four to five.;  Surgeon: Jessy Oto, MD;  Location: [facility];  Service: Orthopedics;  Laterality: N/A;   COLOSTOMY REVERSAL     large bowel perforation     Social History   Occupational History    Comment: Retired  Tobacco Use   Smoking status: Former    Types: Cigarettes    Quit date: 10/01/1983    Years since quitting: 38.3   Smokeless tobacco: Never  Substance and Sexual Activity   Alcohol use: Yes    Alcohol/week: 2.0 - 4.0 standard drinks of alcohol     Types: 2 - 4 Cans of beer per week    Comment: daily  Drug use: No   Sexual activity: Not Currently    Partners: Female

## 2022-02-15 DIAGNOSIS — G4733 Obstructive sleep apnea (adult) (pediatric): Secondary | ICD-10-CM | POA: Diagnosis not present

## 2022-02-17 DIAGNOSIS — G4733 Obstructive sleep apnea (adult) (pediatric): Secondary | ICD-10-CM | POA: Diagnosis not present

## 2022-02-19 ENCOUNTER — Ambulatory Visit: Payer: Medicare HMO | Admitting: Sports Medicine

## 2022-03-06 ENCOUNTER — Ambulatory Visit: Payer: Medicare HMO | Admitting: Specialist

## 2022-03-10 ENCOUNTER — Encounter: Payer: Self-pay | Admitting: Cardiology

## 2022-03-10 ENCOUNTER — Ambulatory Visit: Payer: Medicare HMO | Admitting: Cardiology

## 2022-03-10 VITALS — BP 150/76 | HR 84 | Ht 70.0 in | Wt 205.0 lb

## 2022-03-10 DIAGNOSIS — R001 Bradycardia, unspecified: Secondary | ICD-10-CM | POA: Diagnosis not present

## 2022-03-10 DIAGNOSIS — R9431 Abnormal electrocardiogram [ECG] [EKG]: Secondary | ICD-10-CM | POA: Diagnosis not present

## 2022-03-10 DIAGNOSIS — I493 Ventricular premature depolarization: Secondary | ICD-10-CM

## 2022-03-10 DIAGNOSIS — R002 Palpitations: Secondary | ICD-10-CM | POA: Diagnosis not present

## 2022-03-10 NOTE — Progress Notes (Signed)
Electrophysiology Office Note   Date:  03/10/2022   ID:  Alexander [name], DOB 11-Mar-1951, MRN [id number]  PCP:  Donella Stade, PA-C  Cardiologist:  Bettina Gavia Primary Electrophysiologist:  Alencia Gordon Meredith Leeds, MD    Chief Complaint: PVC   History of Present Illness: Alexander [name] is a 71 y.o. male who is being seen today for the evaluation of PVC at the request of Bettina Gavia, Hilton Cork, MD. Presenting today for electrophysiology evaluation.  He has a history significant hypertension, hyperlipidemia, obstructive sleep apnea.  He presented to his primary cardiologist with palpitations.  He was found to have PVCs.  He wore a cardiac monitor with a 17% burden.  Per epic notes, he has been having weakness, lightheadedness, and palpitations.  He tells me today that he has been feeling well.  He is not been actually having weakness, lightheadedness, palpitations.  He is unaware of his PVCs.  He is able to do all of his daily activities and has not been restricted.  Today, he denies symptoms of palpitations, chest pain, shortness of breath, orthopnea, PND, lower extremity edema, claudication, dizziness, presyncope, syncope, bleeding, or neurologic sequela. The patient is tolerating medications without difficulties.    Past Medical History:  Diagnosis Date   Accessory skin tags 04/28/2018   Acute non-recurrent pansinusitis 08/12/2017   Arthralgia of multiple joints 12/30/2019   Barrett's esophagus 09/30/2013   Bilateral lower extremity edema 09/13/2017   Bilateral primary osteoarthritis of hip 10/03/2021   Bipolar 1 disorder, mixed ([place]) 09/30/2013   [facility], Dr. Ardine Eng    BPH (benign prostatic hyperplasia) 09/30/2013   Cervical spondylosis 09/15/2018   Chronic pain disorder 11/01/2018   Chronic pain of right knee 03/25/2017   DDD (degenerative disc disease), cervical 01/29/2018   Diarrhea 03/25/2017   Diverticulitis 2007   Dizziness 11/22/2021   Ear mass, left 06/06/2016   Biopsy- Actinic  keratosis via biopsy.    Elevated calcitonin level 12/30/2019   Elevated fasting glucose 08/30/2017   Elevated PTHrP level 12/30/2019   Erectile dysfunction 07/15/2018   Essential hypertension, benign 09/30/2013   Essential tremor 12/30/2019   Fusion of spine of cervical region 04/19/2019   Generalized anxiety disorder 09/30/2013   GERD (gastroesophageal reflux disease)    Hematoma 04/24/2017   Herniation of cervical intervertebral disc with radiculopathy 04/19/2019   C3-4   History of diverticulitis 10/20/2013   With colonectomy    Hoarseness 10/25/2021   Hyperlipidemia 10/03/2013   AHA 10 year risk of 19.5% Feb 2015, encouraged to start Lipitor   Hyperparathyroidism due to lithium therapy ([place]) 03/30/2020   Per endocrinology as long as he has no sequela and calcium is '1mg'$ /dL from upper limits of normal ok to stay on lithium. No extra calcium.    Impaired fasting glucose 09/30/2013   Irregular heart beat 01/27/2018   Irritable bowel syndrome with diarrhea 10/08/2017   Labile blood pressure 11/22/2021   Lipoma of neck 04/24/2017   Loose stools 01/27/2018   Low libido 10/08/2017   Lumbar degenerative disc disease 04/09/2021   LVH (left ventricular hypertrophy) 01/27/2018   Memory changes 08/30/2017   Nausea 10/25/2021   Neck pain 01/27/2018   NSAID induced gastritis 01/20/2019   OSA on CPAP 12/28/2013   Osteoarthritis of carpometacarpal joint of left thumb 01/05/2015   Pounding heartbeat 11/22/2021   Preventive measure 10/20/2013   Colonoscopy & EGD 2014 [facility] - Repeat EGD 2017, Colon 2019   Primary osteoarthritis of left knee 04/01/2021  Prolonged Q-T interval on ECG 11/22/2021   PVC (premature ventricular contraction) 06/15/2019   Reactive airway disease that is not asthma 09/13/2017   RLS (restless legs syndrome) 03/25/2017   Seborrheic keratoses 03/25/2017   Serum calcium elevated 01/27/2018   Sinus bradycardia 04/04/2019   Taste impairment 11/22/2021   Past Surgical History:  Procedure  Laterality Date   ANTERIOR CERVICAL DECOMP/DISCECTOMY FUSION N/A 04/19/2019   Procedure: ANTERIOR CERVICAL DISCECTOMY FUSION cervical three to four, cervical four to five.;  Surgeon: Jessy Oto, MD;  Location: [facility];  Service: Orthopedics;  Laterality: N/A;   COLOSTOMY REVERSAL     large bowel perforation       Current Outpatient Medications  Medication Sig Dispense Refill   acetaminophen-codeine (TYLENOL #3) 300-30 MG tablet Take 1 tablet by mouth every 6 (six) hours as needed for moderate pain. 30 tablet 0   AMBULATORY NON FORMULARY MEDICATION Increase Auto-PAP machine with heated humidifier settings to 9-20cm H2O.  Dx: Obstructive sleep apnea. 1 Units 0   amLODipine (NORVASC) 10 MG tablet Take 1 tablet (10 mg total) by mouth daily. 90 tablet 3   aspirin 81 MG tablet Take 81 mg by mouth every other day.     atorvastatin (LIPITOR) 20 MG tablet TAKE 1 TABLET BY MOUTH EVERY DAY 90 tablet 1   benazepril (LOTENSIN) 20 MG tablet TAKE 1 TABLET BY MOUTH EVERY DAY 90 tablet 1   celecoxib (CELEBREX) 200 MG capsule Take one tablet twice a day. 180 capsule 1   cholecalciferol (VITAMIN D3) 25 MCG (1000 UNIT) tablet Take 1,000 Units by mouth in the morning and at bedtime.     doxazosin (CARDURA) 4 MG tablet TAKE 1 TABLET BY MOUTH TWICE A DAY 180 tablet 1   gabapentin (NEURONTIN) 300 MG capsule Take 1 capsule (300 mg total) by mouth 3 (three) times daily. 270 capsule 1   lithium carbonate (ESKALITH) 450 MG CR tablet Take 450 mg by mouth daily.     Omega-3 Fatty Acids (FISH OIL) 1000 MG CAPS Take 1,000 mg by mouth daily.     sertraline (ZOLOFT) 100 MG tablet Take 100 mg by mouth daily.     No current facility-administered medications for this visit.    Allergies:   Effexor [venlafaxine]   Social History:  The patient  reports that he quit smoking about 38 years ago. His smoking use included cigarettes. He has never used smokeless tobacco. He reports current alcohol use of about 2.0 - 4.0 standard  drinks of alcohol per week. He reports that he does not use drugs.   Family History:  The patient's family history includes Hypertension in his brother and sister.    ROS:  Please see the history of present illness.   Otherwise, review of systems is positive for none.   All other systems are reviewed and negative.    PHYSICAL EXAM: VS:  BP (!) 150/76   Pulse 84   Ht '5\' 10"'$  (1.778 m)   Wt 205 lb (93 kg)   SpO2 95%   BMI 29.41 kg/m  , BMI Body mass index is 29.41 kg/m. GEN: Well nourished, well developed, in no acute distress  HEENT: normal  Neck: no JVD, carotid bruits, or masses Cardiac: RRR; no murmurs, rubs, or gallops,no edema  Respiratory:  clear to auscultation bilaterally, normal work of breathing GI: soft, nontender, nondistended, + BS MS: no deformity or atrophy  Skin: warm and dry Neuro:  Strength and sensation are intact Psych: euthymic mood,  full affect  EKG:  EKG is ordered today. Personal review of the ekg ordered shows sinus rhythm, PVCs  Recent Labs: 11/22/2021: ALT 35; BUN 14; Creat 1.18; Hemoglobin 14.0; Magnesium 1.9; Platelets 208; Potassium 4.4; Sodium 136; TSH 2.50    Lipid Panel     Component Value Date/Time   CHOL 151 10/01/2021 0000   TRIG 180 (H) 10/01/2021 0000   HDL 55 10/01/2021 0000   CHOLHDL 2.7 10/01/2021 0000   VLDL 27 07/25/2016 0938   LDLCALC 70 10/01/2021 0000     Wt Readings from Last 3 Encounters:  03/10/22 205 lb (93 kg)  02/06/22 213 lb (96.6 kg)  01/02/22 213 lb (96.6 kg)      Other studies Reviewed: Additional studies/ records that were reviewed today include: Cardiac monitor 01/11/2022 personally reviewed Review of the above records today demonstrates:   Patient had a min HR of 34 bpm, max HR of 114 bpm, and avg HR of 58 bpm. Predominant underlying rhythm was Sinus Rhythm.  First Degree AV Block was present.  No pauses of 3 seconds or greater no episodes of second or third-degree AV block. 5 episodes of ectopic atrial  tachycardia runs occurred, the run with the fastest interval lasting 10 beats  With a max rate of 104 bpm (avg 92 bpm); the run with the fastest interval was also the longest. Isolated SVEs were occasional (1.5%, 2577), SVE Couplets were rare (<1.0%, 45), and no SVE Triplets were present.  Isolated VEs were frequent (17.3%, [id number]), VE Couplets were rare (<1.0%, 137), and VE Triplets were rare (<1.0%, 1). Ventricular Bigeminy and Trigeminy were present. There were no triggered or diary events.   ASSESSMENT AND PLAN:  1.  PVCs: Found to have an elevated burden at 17%.  He feels well and is unaware of his elevated PVCs.  He states that he is able to do all of his daily activities.  Rossi Burdo order an echo to ensure that his ejection fraction has not gone down.  If not, we Vedika Dumlao have him follow-up in 1 year with a repeat echo at that time.  If he does have evidence of reduced LV function, we Neya Creegan bring him back to discuss antiarrhythmic medications.  2.  Hypertension: Elevated today.  Usually well-controlled.  No changes.  Current medicines are reviewed at length with the patient today.   The patient does not have concerns regarding his medicines.  The following changes were made today:  none  Labs/ tests ordered today include:  Orders Placed This Encounter  Procedures   EKG 12-Lead   ECHOCARDIOGRAM COMPLETE     Disposition:   FU with Alven Alverio 1 year  Signed, Alverda Nazzaro Meredith Leeds, MD  03/10/2022 3:37 PM     [facility] [address] [place] [address] [phone] (office) [phone] (fax)

## 2022-03-10 NOTE — Patient Instructions (Signed)
Medication Instructions:  Your physician recommends that you continue on your current medications as directed. Please refer to the Current Medication list given to you today.  *If you need a refill on your cardiac medications before your next appointment, please call your pharmacy*   Lab Work: None ordered.  If you have labs (blood work) drawn today and your tests are completely normal, you will receive your results only by: [facility] (if you have MyChart) OR A paper copy in the mail If you have any lab test that is abnormal or we need to change your treatment, we will call you to review the results.   Testing/Procedures: Your physician has requested that you have an echocardiogram. Echocardiography is a painless test that uses sound waves to create images of your heart. It provides your doctor with information about the size and shape of your heart and how well your heart's chambers and valves are working. This procedure takes approximately one hour. There are no restrictions for this procedure.     Follow-Up: At [facility], you and your health needs are our priority.  As part of our continuing mission to provide you with exceptional heart care, we have created designated Provider Care Teams.  These Care Teams include your primary Cardiologist (physician) and Advanced Practice Providers (APPs -  Physician Assistants and Nurse Practitioners) who all work together to provide you with the care you need, when you need it.  We recommend signing up for the patient portal called "MyChart".  Sign up information is provided on this After Visit Summary.  MyChart is used to connect with patients for Virtual Visits (Telemedicine).  Patients are able to view lab/test results, encounter notes, upcoming appointments, etc.  Non-urgent messages can be sent to your provider as well.   To learn more about what you can do with MyChart, go to [email or web address].    Your next appointment:    Your follow up will be based on echo results  Important Information About Sugar

## 2022-03-18 DIAGNOSIS — G4733 Obstructive sleep apnea (adult) (pediatric): Secondary | ICD-10-CM | POA: Diagnosis not present

## 2022-03-20 ENCOUNTER — Ambulatory Visit (HOSPITAL_BASED_OUTPATIENT_CLINIC_OR_DEPARTMENT_OTHER)
Admission: RE | Admit: 2022-03-20 | Discharge: 2022-03-20 | Disposition: A | Discharge status: Home / Self Care | Payer: Medicare HMO | Source: Ambulatory Visit | Attending: Cardiology | Admitting: Cardiology

## 2022-03-20 DIAGNOSIS — I493 Ventricular premature depolarization: Secondary | ICD-10-CM | POA: Diagnosis not present

## 2022-03-20 DIAGNOSIS — I10 Essential (primary) hypertension: Secondary | ICD-10-CM | POA: Insufficient documentation

## 2022-03-20 DIAGNOSIS — I361 Nonrheumatic tricuspid (valve) insufficiency: Secondary | ICD-10-CM | POA: Diagnosis not present

## 2022-03-20 DIAGNOSIS — I340 Nonrheumatic mitral (valve) insufficiency: Secondary | ICD-10-CM | POA: Insufficient documentation

## 2022-03-20 DIAGNOSIS — I491 Atrial premature depolarization: Secondary | ICD-10-CM

## 2022-03-20 DIAGNOSIS — G4733 Obstructive sleep apnea (adult) (pediatric): Secondary | ICD-10-CM | POA: Diagnosis not present

## 2022-03-20 NOTE — Progress Notes (Signed)
  Echocardiogram 2D Echocardiogram has been performed.  Alexander [name] F 03/20/2022, 2:01 PM

## 2022-03-21 ENCOUNTER — Other Ambulatory Visit: Payer: Self-pay | Admitting: Physician Assistant

## 2022-03-21 DIAGNOSIS — I10 Essential (primary) hypertension: Secondary | ICD-10-CM

## 2022-03-21 LAB — ECHOCARDIOGRAM COMPLETE
AR max vel: 3.16 cm2
AV Area VTI: 2.76 cm2
AV Area mean vel: 3.09 cm2
AV Mean grad: 5 mmHg
AV Peak grad: 8.6 mmHg
Ao pk vel: 1.47 m/s
S' Lateral: 3.5 cm

## 2022-04-03 ENCOUNTER — Ambulatory Visit: Payer: Medicare HMO | Admitting: Specialist

## 2022-04-03 ENCOUNTER — Encounter: Payer: Self-pay | Admitting: Specialist

## 2022-04-03 VITALS — BP 120/75 | HR 70 | Ht 70.0 in | Wt 213.0 lb

## 2022-04-03 DIAGNOSIS — M4322 Fusion of spine, cervical region: Secondary | ICD-10-CM

## 2022-04-03 DIAGNOSIS — M4722 Other spondylosis with radiculopathy, cervical region: Secondary | ICD-10-CM

## 2022-04-03 DIAGNOSIS — M542 Cervicalgia: Secondary | ICD-10-CM

## 2022-04-06 DIAGNOSIS — H18892 Other specified disorders of cornea, left eye: Secondary | ICD-10-CM | POA: Diagnosis not present

## 2022-04-06 DIAGNOSIS — Z87891 Personal history of nicotine dependence: Secondary | ICD-10-CM | POA: Diagnosis not present

## 2022-04-06 DIAGNOSIS — H5789 Other specified disorders of eye and adnexa: Secondary | ICD-10-CM | POA: Diagnosis not present

## 2022-04-06 DIAGNOSIS — H579 Unspecified disorder of eye and adnexa: Secondary | ICD-10-CM | POA: Diagnosis not present

## 2022-04-09 ENCOUNTER — Telehealth: Payer: Self-pay

## 2022-04-09 DIAGNOSIS — R9431 Abnormal electrocardiogram [ECG] [EKG]: Secondary | ICD-10-CM

## 2022-04-09 DIAGNOSIS — I493 Ventricular premature depolarization: Secondary | ICD-10-CM

## 2022-04-09 NOTE — Telephone Encounter (Signed)
Pt advised his Echo results and order placed for repeat in one year.

## 2022-04-09 NOTE — Telephone Encounter (Signed)
-----   Message from Will Meredith Leeds, MD sent at 03/21/2022  2:30 PM EDT ----- EF remains normal. Needs TTE in one year with follow up post echo.

## 2022-04-14 NOTE — Progress Notes (Unsigned)
Cardiology Office Note:    Date:  04/15/2022   ID:  [name], DOB [birth year], MRN [id number]  PCP:  Donella Stade, PA-C  Cardiologist:  Shirlee More, MD    Referring MD: Donella Stade, PA-C    ASSESSMENT:    1. Frequent PVCs   2. Sinus bradycardia   3. Essential hypertension, benign    PLAN:    In order of problems listed above:  Alexander [name] has frequent PVCs asymptomatic and no evidence of cardiomyopathy.  By EP he has been seen by EP with a conservative approach has been adopted.  I have asked him if he has symptoms palpitation or syncope to contact us otherwise I think we should streamline his care and follow-up with EP in 1 year No concerning bradycardia on his event monitor no need to interrupt his treatment with lithium Stable hypertension controlled continue his ACE inhibitor  Next appointment: Follow-up with EP in 1 year see me as needed in future   Medication Adjustments/Labs and Tests Ordered: Current medicines are reviewed at length with the patient today.  Concerns regarding medicines are outlined above.  No orders of the defined types were placed in this encounter.  No orders of the defined types were placed in this encounter.   Chief Complaint  Patient presents with   Follow-up    Frequent PVC's    History of Present Illness:    CRISTOFHER [name] is a 71 y.o. male with a hx of hypertension hyperlipidemia and obstructive sleep apnea last seen 12/31/2021 for palpitation and EKG showing first-degree AV block mildly prolonged QT interval.  His event monitor showed a high burden of PVCs with couplets and triplets and he was referred for EP evaluation.  With normal structure of the left and right ventricle was not felt to require antiarrhythmic drug therapy.  Compliance with diet, lifestyle and medications: Yes  An echocardiogram performed 03/20/2022 with EF 55 to 60% normal left ventricular size and systolic function normal right ventricular size  function and pulmonary artery pressure.  The left atrium was dilated there was mild to moderate mitral regurgitation seen.  1. Left ventricular ejection fraction, by estimation, is 55 to 60%. The  left ventricle has normal function. The left ventricle has no regional  wall motion abnormalities. Left ventricular diastolic parameters are  indeterminate.   2. Right ventricular systolic function is normal. The right ventricular  size is normal. There is normal pulmonary artery systolic pressure.   3. Left atrial size was severely dilated.   4. The mitral valve is normal in structure. Mild to moderate mitral valve  regurgitation. No evidence of mitral stenosis.   5. The aortic valve is normal in structure. Aortic valve regurgitation is  trivial. No aortic stenosis is present.   6. The inferior vena cava is normal in size with greater than 50%  respiratory variability, suggesting right atrial pressure of 3 mmHg.   An event monitor was performed with concerns of bradycardia was found to have frequent PVCs with a burden of 17.3% with couplets and triplets. Patch Wear Time:  2 days and 1 hours (2023-05-30T15:25:54-0400 to 2023-06-01T16:45:35-0400)   Patient had a min HR of 34 bpm, max HR of 114 bpm, and avg HR of 58 bpm. Predominant underlying rhythm was Sinus Rhythm.    First Degree AV Block was present.  No pauses of 3 seconds or greater no episodes of second or third-degree AV block.   5 episodes of ectopic atrial tachycardia runs  occurred, the run with the fastest interval lasting 10 beats  With a max rate of 104 bpm (avg 92 bpm); the run with the fastest interval was also the longest. Isolated SVEs were occasional (1.5%, 2577), SVE Couplets were rare (<1.0%, 45), and no SVE Triplets were present.    Isolated VEs were frequent (17.3%, [id number]), VE Couplets were rare (<1.0%, 137), and VE Triplets were rare (<1.0%, 1). Ventricular Bigeminy and Trigeminy were present.   There were no triggered or  diary events.  I reviewed the results of his testing with him he is not having palpitation lightheadedness or syncope. He takes no over-the-counter proarrhythmic drugs He has not not had edema shortness of breath or chest pain Past Medical History:  Diagnosis Date   Accessory skin tags 04/28/2018   Acute non-recurrent pansinusitis 08/12/2017   Arthralgia of multiple joints 12/30/2019   Barrett's esophagus 09/30/2013   Bilateral lower extremity edema 09/13/2017   Bilateral primary osteoarthritis of hip 10/03/2021   Bipolar 1 disorder, mixed ([place]) 09/30/2013   [facility], Dr. Ardine Eng    BPH (benign prostatic hyperplasia) 09/30/2013   Cervical spondylosis 09/15/2018   Chronic pain disorder 11/01/2018   Chronic pain of right knee 03/25/2017   DDD (degenerative disc disease), cervical 01/29/2018   Diarrhea 03/25/2017   Diverticulitis 2007   Dizziness 11/22/2021   Ear mass, left 06/06/2016   Biopsy- Actinic keratosis via biopsy.    Elevated calcitonin level 12/30/2019   Elevated fasting glucose 08/30/2017   Elevated PTHrP level 12/30/2019   Erectile dysfunction 07/15/2018   Essential hypertension, benign 09/30/2013   Essential tremor 12/30/2019   Fusion of spine of cervical region 04/19/2019   Generalized anxiety disorder 09/30/2013   GERD (gastroesophageal reflux disease)    Hematoma 04/24/2017   Herniation of cervical intervertebral disc with radiculopathy 04/19/2019   C3-4   History of diverticulitis 10/20/2013   With colonectomy    Hoarseness 10/25/2021   Hyperlipidemia 10/03/2013   AHA 10 year risk of 19.5% Feb 2015, encouraged to start Lipitor   Hyperparathyroidism due to lithium therapy ([place]) 03/30/2020   Per endocrinology as long as he has no sequela and calcium is '1mg'$ /dL from upper limits of normal ok to stay on lithium. No extra calcium.    Impaired fasting glucose 09/30/2013   Irregular heart beat 01/27/2018   Irritable bowel syndrome with diarrhea 10/08/2017   Labile blood pressure 11/22/2021    Lipoma of neck 04/24/2017   Loose stools 01/27/2018   Low libido 10/08/2017   Lumbar degenerative disc disease 04/09/2021   LVH (left ventricular hypertrophy) 01/27/2018   Memory changes 08/30/2017   Nausea 10/25/2021   Neck pain 01/27/2018   NSAID induced gastritis 01/20/2019   OSA on CPAP 12/28/2013   Osteoarthritis of carpometacarpal joint of left thumb 01/05/2015   Pounding heartbeat 11/22/2021   Preventive measure 10/20/2013   Colonoscopy & EGD 2014 [facility] - Repeat EGD 2017, Colon 2019   Primary osteoarthritis of left knee 04/01/2021   Prolonged Q-T interval on ECG 11/22/2021   PVC (premature ventricular contraction) 06/15/2019   Reactive airway disease that is not asthma 09/13/2017   RLS (restless legs syndrome) 03/25/2017   Seborrheic keratoses 03/25/2017   Serum calcium elevated 01/27/2018   Sinus bradycardia 04/04/2019   Taste impairment 11/22/2021    Past Surgical History:  Procedure Laterality Date   ANTERIOR CERVICAL DECOMP/DISCECTOMY FUSION N/A 04/19/2019   Procedure: ANTERIOR CERVICAL DISCECTOMY FUSION cervical three to four, cervical four to five.;  Surgeon: Jessy Oto, MD;  Location: [facility];  Service: Orthopedics;  Laterality: N/A;   COLOSTOMY REVERSAL     large bowel perforation      Current Medications: Current Meds  Medication Sig   AMBULATORY NON FORMULARY MEDICATION Increase Auto-PAP machine with heated humidifier settings to 9-20cm H2O.  Dx: Obstructive sleep apnea.   amLODipine (NORVASC) 10 MG tablet Take 1 tablet (10 mg total) by mouth daily.   aspirin 81 MG tablet Take 81 mg by mouth every other day.   atorvastatin (LIPITOR) 20 MG tablet TAKE 1 TABLET BY MOUTH EVERY DAY   benazepril (LOTENSIN) 20 MG tablet TAKE 1 TABLET BY MOUTH EVERY DAY   celecoxib (CELEBREX) 200 MG capsule Take one tablet twice a day.   cholecalciferol (VITAMIN D3) 25 MCG (1000 UNIT) tablet Take 1,000 Units by mouth in the morning and at bedtime.   doxazosin (CARDURA) 4 MG tablet TAKE 1  TABLET BY MOUTH TWICE A DAY   gabapentin (NEURONTIN) 300 MG capsule Take 1 capsule (300 mg total) by mouth 3 (three) times daily.   lithium carbonate (ESKALITH) 450 MG CR tablet Take 450 mg by mouth daily.   Omega-3 Fatty Acids (FISH OIL) 1000 MG CAPS Take 1,000 mg by mouth daily.   sertraline (ZOLOFT) 100 MG tablet Take 100 mg by mouth daily.     Allergies:   Effexor [venlafaxine]   Social History   Socioeconomic History   Marital status: Widowed    Spouse name: Not on file   Number of children: 1   Years of education: 68   Highest education level: Some college, no degree  Occupational History    Comment: Retired  Tobacco Use   Smoking status: Former    Types: Cigarettes    Quit date: 10/01/1983    Years since quitting: 38.5   Smokeless tobacco: Never  Substance and Sexual Activity   Alcohol use: Yes    Alcohol/week: 2.0 - 4.0 standard drinks of alcohol    Types: 2 - 4 Cans of beer per week    Comment: daily   Drug use: No   Sexual activity: Not Currently    Partners: Female  Other Topics Concern   Not on file  Social History Narrative   Lives alone. His daughter lives close by. Likes to collect things and build things.   Social Determinants of Health   Financial Resource Strain: Low Risk  (01/08/2021)   Overall Financial Resource Strain (CARDIA)    Difficulty of Paying Living Expenses: Not hard at all  Food Insecurity: No Food Insecurity (01/08/2021)   Hunger Vital Sign    Worried About Running Out of Food in the Last Year: Never true    Ran Out of Food in the Last Year: Never true  Transportation Needs: No Transportation Needs (01/08/2021)   PRAPARE - Hydrologist (Medical): No    Lack of Transportation (Non-Medical): No  Physical Activity: Inactive (01/08/2021)   Exercise Vital Sign    Days of Exercise per Week: 0 days    Minutes of Exercise per Session: 0 min  Stress: No Stress Concern Present (01/08/2021)   [place]    Feeling of Stress : Not at all  Social Connections: Socially Isolated (01/08/2021)   Social Connection and Isolation Panel [NHANES]    Frequency of Communication with Friends and Family: Once a week    Frequency of Social Gatherings with Friends  and Family: Never    Attends Religious Services: Never    Active Member of Clubs or Organizations: No    Attends Archivist Meetings: Never    Marital Status: Widowed     Family History: The patient's family history includes Hypertension in his brother and sister. There is no history of CAD, Diabetes, Heart disease, or Cancer. ROS:   Please see the history of present illness.    All other systems reviewed and are negative.  EKGs/Labs/Other Studies Reviewed:    The following studies were reviewed today:   Recent Labs: 11/22/2021: ALT 35; BUN 14; Creat 1.18; Hemoglobin 14.0; Magnesium 1.9; Platelets 208; Potassium 4.4; Sodium 136; TSH 2.50  Recent Lipid Panel    Component Value Date/Time   CHOL 151 10/01/2021 0000   TRIG 180 (H) 10/01/2021 0000   HDL 55 10/01/2021 0000   CHOLHDL 2.7 10/01/2021 0000   VLDL 27 07/25/2016 0938   LDLCALC 70 10/01/2021 0000    Physical Exam:    VS:  BP 128/70 (BP Location: Left Arm, Patient Position: Sitting)   Pulse 72   Ht '5\' 10"'$  (1.778 m)   Wt 201 lb (91.2 kg)   SpO2 97%   BMI 28.84 kg/m     Wt Readings from Last 3 Encounters:  04/15/22 201 lb (91.2 kg)  04/03/22 213 lb (96.6 kg)  03/10/22 205 lb (93 kg)     GEN:  Well nourished, well developed in no acute distress HEENT: Normal NECK: No JVD; No carotid bruits LYMPHATICS: No lymphadenopathy CARDIAC: RRR, no murmurs, rubs, gallops RESPIRATORY:  Clear to auscultation without rales, wheezing or rhonchi  ABDOMEN: Soft, non-tender, non-distended MUSCULOSKELETAL:  No edema; No deformity  SKIN: Warm and dry NEUROLOGIC:  Alert and oriented x 3 PSYCHIATRIC:  Normal affect     Signed, Shirlee More, MD  04/15/2022 10:38 AM    [place]

## 2022-04-15 ENCOUNTER — Encounter: Payer: Self-pay | Admitting: Cardiology

## 2022-04-15 ENCOUNTER — Ambulatory Visit: Payer: Medicare HMO | Attending: Cardiology | Admitting: Cardiology

## 2022-04-15 VITALS — BP 128/70 | HR 72 | Ht 70.0 in | Wt 201.0 lb

## 2022-04-15 DIAGNOSIS — I10 Essential (primary) hypertension: Secondary | ICD-10-CM

## 2022-04-15 DIAGNOSIS — R001 Bradycardia, unspecified: Secondary | ICD-10-CM

## 2022-04-15 DIAGNOSIS — I493 Ventricular premature depolarization: Secondary | ICD-10-CM | POA: Diagnosis not present

## 2022-04-15 NOTE — Patient Instructions (Addendum)
Medication Instructions:  Your physician recommends that you continue on your current medications as directed. Please refer to the Current Medication list given to you today.  *If you need a refill on your cardiac medications before your next appointment, please call your pharmacy*   Lab Work: None If you have labs (blood work) drawn today and your tests are completely normal, you will receive your results only by: [place] (if you have MyChart) OR A paper copy in the mail If you have any lab test that is abnormal or we need to change your treatment, we will call you to review the results.   Testing/Procedures: None   Follow-Up: At [facility], you and your health needs are our priority.  As part of our continuing mission to provide you with exceptional heart care, we have created designated Provider Care Teams.  These Care Teams include your primary Cardiologist (physician) and Advanced Practice Providers (APPs -  Physician Assistants and Nurse Practitioners) who all work together to provide you with the care you need, when you need it.  We recommend signing up for the patient portal called "MyChart".  Sign up information is provided on this After Visit Summary.  MyChart is used to connect with patients for Virtual Visits (Telemedicine).  Patients are able to view lab/test results, encounter notes, upcoming appointments, etc.  Non-urgent messages can be sent to your provider as well.   To learn more about what you can do with MyChart, go to [email or web address].    Your next appointment:   1 year(s)  The format for your next appointment:   In Person  Provider:   Dr. Curt Bears, MD.  Other Instructions None  Important Information About Sugar         1. Avoid all over-the-counter antihistamines except Claritin/Loratadine and Zyrtec/Cetrizine. 2. Avoid all combination including cold sinus allergies flu decongestant and sleep medications 3. You can use  Robitussin DM Mucinex and Mucinex DM for cough. 4. can use Tylenol aspirin ibuprofen and naproxen but no combinations such as s  eep or sinus.

## 2022-04-20 DIAGNOSIS — G4733 Obstructive sleep apnea (adult) (pediatric): Secondary | ICD-10-CM | POA: Diagnosis not present

## 2022-04-28 DIAGNOSIS — F3130 Bipolar disorder, current episode depressed, mild or moderate severity, unspecified: Secondary | ICD-10-CM | POA: Diagnosis not present

## 2022-04-28 DIAGNOSIS — R69 Illness, unspecified: Secondary | ICD-10-CM | POA: Diagnosis not present

## 2022-05-05 ENCOUNTER — Encounter: Payer: Self-pay | Admitting: Physician Assistant

## 2022-05-05 ENCOUNTER — Ambulatory Visit (INDEPENDENT_AMBULATORY_CARE_PROVIDER_SITE_OTHER): Payer: Medicare HMO | Admitting: Physician Assistant

## 2022-05-05 VITALS — BP 152/86 | HR 58 | Ht 70.0 in | Wt 202.0 lb

## 2022-05-05 DIAGNOSIS — R202 Paresthesia of skin: Secondary | ICD-10-CM | POA: Diagnosis not present

## 2022-05-05 DIAGNOSIS — M47812 Spondylosis without myelopathy or radiculopathy, cervical region: Secondary | ICD-10-CM

## 2022-05-05 DIAGNOSIS — G250 Essential tremor: Secondary | ICD-10-CM

## 2022-05-05 DIAGNOSIS — I10 Essential (primary) hypertension: Secondary | ICD-10-CM | POA: Diagnosis not present

## 2022-05-05 DIAGNOSIS — Z79899 Other long term (current) drug therapy: Secondary | ICD-10-CM

## 2022-05-05 DIAGNOSIS — Z23 Encounter for immunization: Secondary | ICD-10-CM | POA: Diagnosis not present

## 2022-05-05 NOTE — Progress Notes (Signed)
   Established Patient Office Visit  Subjective   Patient ID: Alexander [name], male    DOB: 07-20-1951  Age: 71 y.o. MRN: [id number]  Chief Complaint  Patient presents with   Hypertension    Hypertension   Pt is a 71 yo M with PMH of HTN, PVCs, essential tremor, DDD that presents for HTN f/u. He has not been checking his BP at home but is working on getting a new BP monitor. Cardiology did not find anything concerning and will be following up with him in a year. Denies CP, SOB.   He c/o neck pain radiating into his shoulder due to cervical DDD. It hurts to put on his socks/shoes. Denies numbness/tingling/weakness. He has tried many things for this without improvement and is interested in acupuncture. Gabapentin did not help this pain but did help the essential tremor. He has been taking it 3x/day.   Dr. Ardine Eng, his psychiatrist has requested labs.   He also c/o of facial irritation described as a feeling of bugs on his face. He understands they are not really there but it really bothers him throughout the day.    ROS See HPI.    Objective:     BP (!) 160/69   Pulse (!) 58   Ht '5\' 10"'$  (1.778 m)   Wt 91.6 kg   SpO2 99%   BMI 28.99 kg/m  BP Readings from Last 3 Encounters:  05/05/22 (!) 152/86  04/15/22 128/70  04/03/22 120/75   Wt Readings from Last 3 Encounters:  05/05/22 91.6 kg  04/15/22 91.2 kg  04/03/22 96.6 kg      Physical Exam Constitutional:      Appearance: Normal appearance.  HENT:     Head: Normocephalic.  Cardiovascular:     Rate and Rhythm: Normal rate and regular rhythm.     Pulses: Normal pulses.     Heart sounds: Normal heart sounds.  Pulmonary:     Effort: Pulmonary effort is normal.     Breath sounds: Normal breath sounds.  Musculoskeletal:     Right lower leg: No edema.     Left lower leg: No edema.  Skin:    General: Skin is warm and dry.  Neurological:     Mental Status: He is alert and oriented to person, place, and time.   Psychiatric:        Mood and Affect: Mood normal.        Behavior: Behavior normal.      Assessment & Plan:    [name] KitchenMarland KitchenElenore Rota [name] was seen today for hypertension.  Diagnoses and all orders for this visit:  Essential hypertension, benign -     COMPLETE METABOLIC PANEL WITH GFR  Need for immunization against influenza -     Flu Vaccine QUAD High Dose(Fluad)  Medication management -     Lithium level  Essential tremor  Cervical spondylosis  Facial tingling sensation   Recommended Dr. Romilda Garret for acupuncture to try for pain  Will try adding a 2nd gabapentin in the evening with follow up via phone call or MyChart to see if any improvement in facial tingling sensation  CMP and lithium level ordered today to be sent to Dr. Ardine Eng Flu vaccine given  BP not to goal in office today but pt has not taken his meds today Follow up with any new or worsening symptoms or in 3 months

## 2022-05-05 NOTE — Progress Notes (Signed)
Esau Grew to send labs too.

## 2022-05-05 NOTE — Patient Instructions (Addendum)
Dr. Tilden Fossa [facility].   Increase gabapentin to 1 tablet in morning/afternoon and 2 in the evening.

## 2022-05-06 LAB — COMPLETE METABOLIC PANEL WITH GFR
AG Ratio: 2 (calc) (ref 1.0–2.5)
ALT: 25 U/L (ref 9–46)
AST: 23 U/L (ref 10–35)
Albumin: 4.3 g/dL (ref 3.6–5.1)
Alkaline phosphatase (APISO): 87 U/L (ref 35–144)
BUN: 11 mg/dL (ref 7–25)
CO2: 24 mmol/L (ref 20–32)
Calcium: 10.6 mg/dL — ABNORMAL HIGH (ref 8.6–10.3)
Chloride: 109 mmol/L (ref 98–110)
Creat: 0.84 mg/dL (ref 0.70–1.28)
Globulin: 2.2 g/dL (calc) (ref 1.9–3.7)
Glucose, Bld: 105 mg/dL — ABNORMAL HIGH (ref 65–99)
Potassium: 4.1 mmol/L (ref 3.5–5.3)
Sodium: 140 mmol/L (ref 135–146)
Total Bilirubin: 1.3 mg/dL — ABNORMAL HIGH (ref 0.2–1.2)
Total Protein: 6.5 g/dL (ref 6.1–8.1)
eGFR: 93 mL/min/{1.73_m2} (ref >=60)

## 2022-05-06 LAB — LITHIUM LEVEL: Lithium Lvl: 0.4 mmol/L — ABNORMAL LOW (ref 0.6–1.2)

## 2022-05-06 NOTE — Progress Notes (Signed)
Cmp looks great. Waiting for lithium level to send to Dr. Ardine Eng.

## 2022-05-06 NOTE — Progress Notes (Signed)
Lithium level low. Please fax to Dr. Ardine Eng.

## 2022-05-22 ENCOUNTER — Telehealth: Payer: Self-pay | Admitting: Neurology

## 2022-05-22 NOTE — Telephone Encounter (Signed)
Patient called stating he is 50% better on Gabapentin. He also wants to discuss a medication problem with Luvenia Starch [phone].

## 2022-06-03 ENCOUNTER — Encounter: Payer: Self-pay | Admitting: Specialist

## 2022-06-03 NOTE — Progress Notes (Signed)
Office Visit Note   Patient: Alexander [name]           Date of Birth: 08/27/1950           MRN: [id number] Visit Date: 04/03/2022              Requested by: Donella Stade, PA-C [address],  [place] [address] PCP: Donella Stade, PA-C   Assessment & Plan: Visit Diagnoses:  1. Fusion of spine of cervical region   2. Other spondylosis with radiculopathy, cervical region   3. Cervicalgia     Plan: Avoid overhead lifting and overhead use of the arms. Do not lift greater than 5 lbs. Adjust head rest in vehicle to prevent hyperextension if rear ended. Take extra precautions to avoid falling, including use of a cane if you feel weak. Over the door unit for intermittant traction Continue with celebrex Hot shower at beginning of the day and ice at end of the day.  Surgical solution would be fusion of the C5-6 and C6-7 levels but this would cause more stiffness And would expect need for even more degeneration of the remaining normal se  Follow-Up Instructions: No follow-ups on file.   Orders:  No orders of the defined types were placed in this encounter.  No orders of the defined types were placed in this encounter.     Procedures: No procedures performed   Clinical Data: Findings:  Plain radiographs 02/06/2022 shows anterior cervical plate and screws C3 to C5, no lucency or hardware abnormality, interbody fusions are healed. No acute changes. There is degenerative disc narrowing C5-6 and C6-7 with end plate sclerosis and anterior spur formation. Posterior facet degenerationof the lower segments is present. Severe DDD and spondylosis C5-6 and C6-7 below C3 to C5 ACDF.    Subjective: Chief Complaint  Patient presents with   Neck - Follow-up    71 year old male with neck pain and stiffness. History of C3-4 and C4-5 ACDF nearly 3 years ago. Alexander [name] describe neck pain that is posterior with grating sensation and cruching sensation. Alexander [name] is able to stand  and walk. No leg weakness or falls. No clumbsiness. Was seen 2 months ago and started on a home exercise program with cervical traction. Avoiding upward gaze and working on cervical ROM. Takes celebrex does have cardiac history PVCs.    Review of Systems  Constitutional: Negative.   HENT: Negative.    Eyes: Negative.   Respiratory: Negative.    Cardiovascular: Negative.   Gastrointestinal: Negative.   Endocrine: Negative.   Genitourinary: Negative.   Musculoskeletal: Negative.   Skin: Negative.   Allergic/Immunologic: Negative.   Neurological: Negative.   Hematological: Negative.   Psychiatric/Behavioral: Negative.       Objective: Vital Signs: BP 120/75   Pulse 70   Ht '5\' 10"'$  (1.778 m)   Wt 213 lb (96.6 kg)   BMI 30.56 kg/m   Physical Exam Constitutional:      Appearance: Alexander [name] is well-developed.  HENT:     Head: Normocephalic and atraumatic.  Eyes:     Pupils: Pupils are equal, round, and reactive to light.  Pulmonary:     Effort: Pulmonary effort is normal.     Breath sounds: Normal breath sounds.  Abdominal:     General: Bowel sounds are normal.     Palpations: Abdomen is soft.  Musculoskeletal:     Cervical back: Normal range of motion and neck supple.  Lumbar back: Negative right straight leg raise test and negative left straight leg raise test.  Skin:    General: Skin is warm and dry.  Neurological:     Mental Status: Alexander [name] is alert and oriented to person, place, and time.  Psychiatric:        Behavior: Behavior normal.        Thought Content: Thought content normal.        Judgment: Judgment normal.    Back Exam   Tenderness  The patient is experiencing tenderness in the cervical.  Range of Motion  Extension:  20 abnormal  Flexion:  30  Lateral bend right:  40  Lateral bend left:  40  Rotation right:  40  Rotation left:  40   Muscle Strength  Right Quadriceps:  5/5  Left Quadriceps:  5/5  Right Hamstrings:  5/5  Left Hamstrings:  5/5    Tests  Straight leg raise right: negative Straight leg raise left: negative  Reflexes  Patellar:  1/4 Achilles:  1/4 Biceps:  1/4 Babinski's sign: normal   Other  Toe walk: normal Heel walk: normal Sensation: normal  Comments:  Negative for long tract findings.     Specialty Comments:  No specialty comments available.  Imaging: No results found.   PMFS History: Patient Active Problem List   Diagnosis Date Noted   Herniation of cervical intervertebral disc with radiculopathy 04/19/2019    Priority: High    Class: Chronic   GERD (gastroesophageal reflux disease) 12/24/2021   Prolonged Q-T interval on ECG 11/22/2021   Labile blood pressure 11/22/2021   Taste impairment 11/22/2021   Palpitations 11/22/2021   Dizziness 11/22/2021   Nausea 10/25/2021   Hoarseness 10/25/2021   Bilateral primary osteoarthritis of hip 10/03/2021   Lumbar degenerative disc disease 04/09/2021   Primary osteoarthritis of left knee 04/01/2021   Hyperparathyroidism due to lithium therapy ([place]) 03/30/2020   Elevated PTHrP level 12/30/2019   Elevated calcitonin level 12/30/2019   Essential tremor 12/30/2019   Arthralgia of multiple joints 12/30/2019   PVC (premature ventricular contraction) 06/15/2019   Fusion of spine of cervical region 04/19/2019   Sinus bradycardia 04/04/2019   NSAID induced gastritis 01/20/2019   Chronic pain disorder 11/01/2018   Cervical spondylosis 09/15/2018   Erectile dysfunction 07/15/2018   Accessory skin tags 04/28/2018   DDD (degenerative disc disease), cervical 01/29/2018   Irregular heart beat 01/27/2018   LVH (left ventricular hypertrophy) 01/27/2018   Neck pain 01/27/2018   Serum calcium elevated 01/27/2018   Loose stools 01/27/2018   Low libido 10/08/2017   Irritable bowel syndrome with diarrhea 10/08/2017   Reactive airway disease that is not asthma 09/13/2017   Bilateral lower extremity edema 09/13/2017   Memory changes 08/30/2017   Elevated  fasting glucose 08/30/2017   Acute non-recurrent pansinusitis 08/12/2017   Hematoma 04/24/2017   Lipoma of neck 04/24/2017   Chronic pain of right knee 03/25/2017   Seborrheic keratoses 03/25/2017   RLS (restless legs syndrome) 03/25/2017   Diarrhea 03/25/2017   Ear mass, left 06/06/2016   Osteoarthritis of carpometacarpal joint of left thumb 01/05/2015   OSA on CPAP 12/28/2013   History of diverticulitis 10/20/2013   Preventive measure 10/20/2013   Hyperlipidemia 10/03/2013   Essential hypertension, benign 09/30/2013   Barrett's esophagus 09/30/2013   Impaired fasting glucose 09/30/2013   BPH (benign prostatic hyperplasia) 09/30/2013   Bipolar 1 disorder, mixed ([place]) 09/30/2013   Generalized anxiety disorder 09/30/2013   Diverticulitis 2007  Past Medical History:  Diagnosis Date   Accessory skin tags 04/28/2018   Acute non-recurrent pansinusitis 08/12/2017   Arthralgia of multiple joints 12/30/2019   Barrett's esophagus 09/30/2013   Bilateral lower extremity edema 09/13/2017   Bilateral primary osteoarthritis of hip 10/03/2021   Bipolar 1 disorder, mixed ([place]) 09/30/2013   [facility], Dr. Ardine Eng    BPH (benign prostatic hyperplasia) 09/30/2013   Cervical spondylosis 09/15/2018   Chronic pain disorder 11/01/2018   Chronic pain of right knee 03/25/2017   DDD (degenerative disc disease), cervical 01/29/2018   Diarrhea 03/25/2017   Diverticulitis 2007   Dizziness 11/22/2021   Ear mass, left 06/06/2016   Biopsy- Actinic keratosis via biopsy.    Elevated calcitonin level 12/30/2019   Elevated fasting glucose 08/30/2017   Elevated PTHrP level 12/30/2019   Erectile dysfunction 07/15/2018   Essential hypertension, benign 09/30/2013   Essential tremor 12/30/2019   Fusion of spine of cervical region 04/19/2019   Generalized anxiety disorder 09/30/2013   GERD (gastroesophageal reflux disease)    Hematoma 04/24/2017   Herniation of cervical intervertebral disc with radiculopathy 04/19/2019    C3-4   History of diverticulitis 10/20/2013   With colonectomy    Hoarseness 10/25/2021   Hyperlipidemia 10/03/2013   AHA 10 year risk of 19.5% Feb 2015, encouraged to start Lipitor   Hyperparathyroidism due to lithium therapy ([place]) 03/30/2020   Per endocrinology as long as Alexander [name] has no sequela and calcium is '1mg'$ /dL from upper limits of normal ok to stay on lithium. No extra calcium.    Impaired fasting glucose 09/30/2013   Irregular heart beat 01/27/2018   Irritable bowel syndrome with diarrhea 10/08/2017   Labile blood pressure 11/22/2021   Lipoma of neck 04/24/2017   Loose stools 01/27/2018   Low libido 10/08/2017   Lumbar degenerative disc disease 04/09/2021   LVH (left ventricular hypertrophy) 01/27/2018   Memory changes 08/30/2017   Nausea 10/25/2021   Neck pain 01/27/2018   NSAID induced gastritis 01/20/2019   OSA on CPAP 12/28/2013   Osteoarthritis of carpometacarpal joint of left thumb 01/05/2015   Pounding heartbeat 11/22/2021   Preventive measure 10/20/2013   Colonoscopy & EGD 2014 [facility] - Repeat EGD 2017, Colon 2019   Primary osteoarthritis of left knee 04/01/2021   Prolonged Q-T interval on ECG 11/22/2021   PVC (premature ventricular contraction) 06/15/2019   Reactive airway disease that is not asthma 09/13/2017   RLS (restless legs syndrome) 03/25/2017   Seborrheic keratoses 03/25/2017   Serum calcium elevated 01/27/2018   Sinus bradycardia 04/04/2019   Taste impairment 11/22/2021    Family History  Problem Relation Age of Onset   Hypertension Sister    Hypertension Brother    CAD Neg Hx    Diabetes Neg Hx    Heart disease Neg Hx    Cancer Neg Hx     Past Surgical History:  Procedure Laterality Date   ANTERIOR CERVICAL DECOMP/DISCECTOMY FUSION N/A 04/19/2019   Procedure: ANTERIOR CERVICAL DISCECTOMY FUSION cervical three to four, cervical four to five.;  Surgeon: Jessy Oto, MD;  Location: [facility];  Service: Orthopedics;  Laterality: N/A;   COLOSTOMY REVERSAL     large bowel  perforation     Social History   Occupational History    Comment: Retired  Tobacco Use   Smoking status: Former    Types: Cigarettes    Quit date: 10/01/1983    Years since quitting: 38.6   Smokeless tobacco: Never  Substance and Sexual  Activity   Alcohol use: Yes    Alcohol/week: 2.0 - 4.0 standard drinks of alcohol    Types: 2 - 4 Cans of beer per week    Comment: daily   Drug use: No   Sexual activity: Not Currently    Partners: Female

## 2022-06-03 NOTE — Patient Instructions (Signed)
Avoid overhead lifting and overhead use of the arms. Do not lift greater than 5 lbs. Adjust head rest in vehicle to prevent hyperextension if rear ended. Take extra precautions to avoid falling, including use of a cane if you feel weak. Over the door unit for intermittant traction Continue with celebrex Hot shower at beginning of the day and ice at end of the day.  Surgical solution would be fusion of the C5-6 and C6-7 levels but this would cause more stiffness And would expect need for even more degeneration of the remaining normal segments and further surgery.

## 2022-06-18 ENCOUNTER — Other Ambulatory Visit: Payer: Self-pay | Admitting: Physician Assistant

## 2022-06-18 DIAGNOSIS — E782 Mixed hyperlipidemia: Secondary | ICD-10-CM

## 2022-06-19 DIAGNOSIS — F3130 Bipolar disorder, current episode depressed, mild or moderate severity, unspecified: Secondary | ICD-10-CM | POA: Diagnosis not present

## 2022-06-19 DIAGNOSIS — R69 Illness, unspecified: Secondary | ICD-10-CM | POA: Diagnosis not present

## 2022-06-24 ENCOUNTER — Ambulatory Visit (INDEPENDENT_AMBULATORY_CARE_PROVIDER_SITE_OTHER): Payer: Medicare HMO | Admitting: Physician Assistant

## 2022-06-24 ENCOUNTER — Encounter: Payer: Self-pay | Admitting: Physician Assistant

## 2022-06-24 ENCOUNTER — Other Ambulatory Visit: Payer: Self-pay | Admitting: Physician Assistant

## 2022-06-24 VITALS — BP 154/66 | HR 42 | Ht 70.0 in | Wt 186.0 lb

## 2022-06-24 DIAGNOSIS — N401 Enlarged prostate with lower urinary tract symptoms: Secondary | ICD-10-CM | POA: Diagnosis not present

## 2022-06-24 DIAGNOSIS — I10 Essential (primary) hypertension: Secondary | ICD-10-CM | POA: Diagnosis not present

## 2022-06-24 DIAGNOSIS — W19XXXA Unspecified fall, initial encounter: Secondary | ICD-10-CM | POA: Diagnosis not present

## 2022-06-24 DIAGNOSIS — S0990XA Unspecified injury of head, initial encounter: Secondary | ICD-10-CM | POA: Diagnosis not present

## 2022-06-24 DIAGNOSIS — H539 Unspecified visual disturbance: Secondary | ICD-10-CM

## 2022-06-24 DIAGNOSIS — G250 Essential tremor: Secondary | ICD-10-CM | POA: Diagnosis not present

## 2022-06-24 DIAGNOSIS — R413 Other amnesia: Secondary | ICD-10-CM | POA: Diagnosis not present

## 2022-06-24 DIAGNOSIS — R3911 Hesitancy of micturition: Secondary | ICD-10-CM | POA: Diagnosis not present

## 2022-06-24 DIAGNOSIS — R361 Hematospermia: Secondary | ICD-10-CM | POA: Diagnosis not present

## 2022-06-24 DIAGNOSIS — G8929 Other chronic pain: Secondary | ICD-10-CM

## 2022-06-24 DIAGNOSIS — R519 Headache, unspecified: Secondary | ICD-10-CM | POA: Diagnosis not present

## 2022-06-24 LAB — POCT URINALYSIS DIP (CLINITEK)
Blood, UA: NEGATIVE
Glucose, UA: NEGATIVE mg/dL
Ketones, POC UA: NEGATIVE mg/dL
Leukocytes, UA: NEGATIVE
Nitrite, UA: NEGATIVE
POC PROTEIN,UA: 30 — AB
Spec Grav, UA: 1.02 (ref 1.010–1.025)
Urobilinogen, UA: 0.2 E.U./dL
pH, UA: 6 (ref 5.0–8.0)

## 2022-06-24 MED ORDER — AMLODIPINE BESYLATE 10 MG PO TABS: 10.0000 mg | ORAL_TABLET | Freq: Every day | ORAL | 3 refills | Status: DC

## 2022-06-24 MED ORDER — TAMSULOSIN HCL 0.4 MG PO CAPS: 0.4000 mg | ORAL_CAPSULE | Freq: Every day | ORAL | 3 refills | Status: DC

## 2022-06-24 NOTE — Progress Notes (Signed)
Acute Office Visit  Subjective:     Patient ID: Alexander [name], male    DOB: [birth year], 71 y.o.   MRN: [id number]  Chief Complaint  Patient presents with   Headache   Memory Loss   Fall    HPI  Patient is a confused 71 year old male presenting to clinic with primary concern of headache post fall with head injury. He fell around 5 weeks ago and hit his head while cutting down tree limbs. He cannot remember if he went to the doctor afterwards. He does not remember losing consciousness. Since the fall, his short term memory has declined. He can no longer remember his daughter's phone number or the TV shows he watches. He also is struggling to remember if he is taking medications or not.   Headache started 3 weeks ago and has progressively gotten worse. He states it wraps around his entire head and makes him nauseous and dizzy. He has difficulty focusing his left eye. His tremor has gotten worse in the past few weeks. His psychiatrist, Dr. Ardine Eng, recently discontinued his gabapentin. Denies photophobia, vomiting, chest pain, shortness of breath, recent illness.he does have hx of cervical DDD and neck as well as radicular pain.   Patient is also complaining of some difficulty urinating with weak flow and hesitancy. Two months ago, he masturbated and had blood in his semen.this concerned him. Denies any pain with urination or ejaculation. He has not ejaculated since.  Last sexual encounter was 78.  .. Active Ambulatory Problems    Diagnosis Date Noted   Essential hypertension, benign 09/30/2013   Barrett's esophagus 09/30/2013   Impaired fasting glucose 09/30/2013   BPH (benign prostatic hyperplasia) 09/30/2013   Bipolar 1 disorder, mixed ([place]) 09/30/2013   Generalized anxiety disorder 09/30/2013   Hyperlipidemia 10/03/2013   History of diverticulitis 10/20/2013   Preventive measure 10/20/2013   OSA on CPAP 12/28/2013   Osteoarthritis of carpometacarpal joint of left thumb  01/05/2015   Ear mass, left 06/06/2016   Chronic pain of right knee 03/25/2017   Seborrheic keratoses 03/25/2017   RLS (restless legs syndrome) 03/25/2017   Diarrhea 03/25/2017   Hematoma 04/24/2017   Lipoma of neck 04/24/2017   Acute non-recurrent pansinusitis 08/12/2017   Memory changes 08/30/2017   Elevated fasting glucose 08/30/2017   Reactive airway disease that is not asthma 09/13/2017   Bilateral lower extremity edema 09/13/2017   Low libido 10/08/2017   Irritable bowel syndrome with diarrhea 10/08/2017   Irregular heart beat 01/27/2018   LVH (left ventricular hypertrophy) 01/27/2018   Neck pain 01/27/2018   Serum calcium elevated 01/27/2018   Loose stools 01/27/2018   DDD (degenerative disc disease), cervical 01/29/2018   Accessory skin tags 04/28/2018   Erectile dysfunction 07/15/2018   Cervical spondylosis 09/15/2018   Chronic pain disorder 11/01/2018   NSAID induced gastritis 01/20/2019   Sinus bradycardia 04/04/2019   Herniation of cervical intervertebral disc with radiculopathy 04/19/2019   Fusion of spine of cervical region 04/19/2019   PVC (premature ventricular contraction) 06/15/2019   Elevated PTHrP level 12/30/2019   Elevated calcitonin level 12/30/2019   Essential tremor 12/30/2019   Arthralgia of multiple joints 12/30/2019   Hyperparathyroidism due to lithium therapy ([place]) 03/30/2020   Primary osteoarthritis of left knee 04/01/2021   Lumbar degenerative disc disease 04/09/2021   Bilateral primary osteoarthritis of hip 10/03/2021   Nausea 10/25/2021   Hoarseness 10/25/2021   Prolonged Q-T interval on ECG 11/22/2021   Labile blood pressure 11/22/2021  Taste impairment 11/22/2021   Palpitations 11/22/2021   Dizziness 11/22/2021   Diverticulitis 2007   GERD (gastroesophageal reflux disease) 12/24/2021   Head injury 06/24/2022   Fall 06/24/2022   Worsening headaches 06/24/2022   Vision changes 06/24/2022   Blood in semen 06/24/2022   Resolved  Ambulatory Problems    Diagnosis Date Noted   Need for influenza vaccination 03/25/2017   Epidermal cyst 03/25/2017   Excessive bleeding 04/24/2017   Cough 08/30/2017   Vision changes 10/08/2017   Elevated bilirubin 01/27/2018   Osteoarthritis of facet joint of cervical spine 04/26/2018   Cervical radiculopathy 11/01/2018   Spinal stenosis of cervical region 11/05/2018   Other spondylosis with radiculopathy, cervical region 04/19/2019   Status post cervical spinal fusion 03/30/2020   Past Medical History:  Diagnosis Date   Pounding heartbeat 11/22/2021      Review of Systems  Eyes:  Positive for blurred vision. Negative for double vision and photophobia.  Respiratory:  Negative for shortness of breath.   Cardiovascular:  Negative for chest pain.  Gastrointestinal:  Positive for nausea. Negative for vomiting.  Genitourinary:        Hesitancy, weak stream, blood in semen  Musculoskeletal:  Positive for neck pain.        Objective:    .[name] Vitals:   06/24/22 0932  BP: (!) 154/66  Pulse: (!) 42  SpO2: 99%   .[name] Results for orders placed or performed in visit on 06/24/22  POCT URINALYSIS DIP (CLINITEK)  Result Value Ref Range   Color, UA yellow yellow   Clarity, UA clear clear   Glucose, UA negative negative mg/dL   Bilirubin, UA small (A) negative   Ketones, POC UA negative negative mg/dL   Spec Grav, UA 1.020 1.010 - 1.025   Blood, UA negative negative   pH, UA 6.0 5.0 - 8.0   POC PROTEIN,UA =30 (A) negative, trace   Urobilinogen, UA 0.2 0.2 or 1.0 E.U./dL   Nitrite, UA Negative Negative   Leukocytes, UA Negative Negative     Physical Exam  General: Confused and anxious. Eyes: PERRLA. EOM intact bilaterally. Neuro: CN intact. Confused. Alert and oriented to time, place, person. Normal gait. Negative Romberg. Full ROM and strength. Cardiovascular: Regular rate and rhythm. No murmurs, rubs, gallops. Pulmonary: CTAB       Assessment & Plan:  [name] KitchenMarland KitchenSiddhant Hashemi  was seen today for headache, memory loss and fall.  Diagnoses and all orders for this visit:  Injury of head, initial encounter -     MR Brain W Wo Contrast; Future -     COMPLETE METABOLIC PANEL WITH GFR -     RPR -     Vitamin B12 -     Sedimentation rate -     CBC -     TSH -     Folate  Fall, initial encounter -     MR Brain W Wo Contrast; Future -     COMPLETE METABOLIC PANEL WITH GFR -     RPR -     Vitamin B12 -     Sedimentation rate -     CBC -     TSH -     Folate  Memory changes -     MR Brain W Wo Contrast; Future -     COMPLETE METABOLIC PANEL WITH GFR -     RPR -     Vitamin B12 -     Sedimentation rate -  CBC -     TSH -     Folate  Vision changes -     MR Brain W Wo Contrast; Future -     COMPLETE METABOLIC PANEL WITH GFR -     RPR -     Vitamin B12 -     Sedimentation rate -     CBC -     TSH -     Folate  Worsening headaches -     MR Brain W Wo Contrast; Future -     COMPLETE METABOLIC PANEL WITH GFR -     RPR -     Vitamin B12 -     Sedimentation rate -     CBC -     TSH -     Folate  Essential hypertension, benign -     amLODipine (NORVASC) 10 MG tablet; Take 1 tablet (10 mg total) by mouth daily. -     MR Brain W Wo Contrast; Future -     COMPLETE METABOLIC PANEL WITH GFR -     RPR -     Vitamin B12 -     Sedimentation rate -     CBC -     TSH -     Folate  Essential tremor  Blood in semen -     PSA, total and free -     POCT URINALYSIS DIP (CLINITEK) -     Urine Culture  Benign prostatic hyperplasia with urinary hesitancy -     PSA, total and free -     tamsulosin (FLOMAX) 0.4 MG CAPS capsule; Take 1 capsule (0.4 mg total) by mouth daily after supper. For prostate and urinary symptoms.   Overall concerned with patients presentation today. He is not himself. He struggled as a historian. He gave different answers each time asked a question.   Headache post fall with head injury  - STAT MRI to assess for any signs  of injury, bleeding, or ischemia - Check CBC  2. Possible benign prostatic hyperplasia due to urinary symptoms  - Ordered urinalysis and PSA. -UA was positive for protein and bilirubin, will culture - Start flomax for urinary symptoms.  3. Short Term Memory Loss and Confusion - STAT MRI -dementia panel ordered today  4. Essential Tremor worsening - Likely secondary to discontinuing gabapentin.  5. Vision Changes - Follow up with ophthalmologist.  - Continue to monitor  6. Blood in Semen - Ordered PSA/UA  Follow up in 1 week to reassess.  Spent 45 minutes with patient and reviewing chart and discussing plan.   Iran Planas, PA-C

## 2022-06-24 NOTE — Patient Instructions (Addendum)
Ordered MRI.  GET labs.  Start flomax

## 2022-06-25 NOTE — Progress Notes (Signed)
Folate and B12 are on the low side. Start taking b12 9396UGA daily and folic acid 484FUW daily.

## 2022-06-26 LAB — URINE CULTURE
MICRO NUMBER:: 14223364
Result:: NO GROWTH
SPECIMEN QUALITY:: ADEQUATE

## 2022-06-27 LAB — PSA, TOTAL AND FREE
PSA, % Free: 27 % (calc) (ref >25)
PSA, Free: 0.4 ng/mL
PSA, Total: 1.5 ng/mL (ref <=4.0)

## 2022-06-27 LAB — CBC
HCT: 44.7 % (ref 38.5–50.0)
Hemoglobin: 15.6 g/dL (ref 13.2–17.1)
MCH: 32.8 pg (ref 27.0–33.0)
MCHC: 34.9 g/dL (ref 32.0–36.0)
MCV: 94.1 fL (ref 80.0–100.0)
MPV: 12.3 fL (ref 7.5–12.5)
Platelets: 215 10*3/uL (ref 140–400)
RBC: 4.75 10*6/uL (ref 4.20–5.80)
RDW: 12 % (ref 11.0–15.0)
WBC: 8.8 10*3/uL (ref 3.8–10.8)

## 2022-06-27 LAB — COMPLETE METABOLIC PANEL WITH GFR
AG Ratio: 2 (calc) (ref 1.0–2.5)
ALT: 25 U/L (ref 9–46)
AST: 21 U/L (ref 10–35)
Albumin: 4.7 g/dL (ref 3.6–5.1)
Alkaline phosphatase (APISO): 91 U/L (ref 35–144)
BUN: 18 mg/dL (ref 7–25)
CO2: 26 mmol/L (ref 20–32)
Calcium: 11.8 mg/dL — ABNORMAL HIGH (ref 8.6–10.3)
Chloride: 106 mmol/L (ref 98–110)
Creat: 0.95 mg/dL (ref 0.70–1.28)
Globulin: 2.4 g/dL (calc) (ref 1.9–3.7)
Glucose, Bld: 124 mg/dL — ABNORMAL HIGH (ref 65–99)
Potassium: 4 mmol/L (ref 3.5–5.3)
Sodium: 140 mmol/L (ref 135–146)
Total Bilirubin: 1.4 mg/dL — ABNORMAL HIGH (ref 0.2–1.2)
Total Protein: 7.1 g/dL (ref 6.1–8.1)
eGFR: 85 mL/min/{1.73_m2} (ref >=60)

## 2022-06-27 LAB — VITAMIN B12: Vitamin B-12: 260 pg/mL (ref 200–1100)

## 2022-06-27 LAB — FOLATE: Folate: 5.7 ng/mL

## 2022-06-27 LAB — TSH: TSH: 2.22 mIU/L (ref 0.40–4.50)

## 2022-06-27 LAB — SEDIMENTATION RATE: Sed Rate: 6 mm/h (ref 0–20)

## 2022-06-27 LAB — RPR: RPR Ser Ql: NONREACTIVE

## 2022-06-30 ENCOUNTER — Ambulatory Visit (INDEPENDENT_AMBULATORY_CARE_PROVIDER_SITE_OTHER): Payer: Medicare HMO

## 2022-06-30 DIAGNOSIS — S0990XA Unspecified injury of head, initial encounter: Secondary | ICD-10-CM | POA: Diagnosis not present

## 2022-06-30 DIAGNOSIS — I10 Essential (primary) hypertension: Secondary | ICD-10-CM

## 2022-06-30 DIAGNOSIS — H539 Unspecified visual disturbance: Secondary | ICD-10-CM

## 2022-06-30 DIAGNOSIS — W19XXXA Unspecified fall, initial encounter: Secondary | ICD-10-CM

## 2022-06-30 DIAGNOSIS — R413 Other amnesia: Secondary | ICD-10-CM

## 2022-06-30 DIAGNOSIS — R4182 Altered mental status, unspecified: Secondary | ICD-10-CM | POA: Diagnosis not present

## 2022-06-30 DIAGNOSIS — R519 Headache, unspecified: Secondary | ICD-10-CM | POA: Diagnosis not present

## 2022-06-30 MED ORDER — GADOBUTROL 1 MMOL/ML IV SOLN
8.5000 mL | Freq: Once | INTRAVENOUS | Status: AC | PRN
  Administered 2022-06-30: 10 mL via INTRAVENOUS

## 2022-07-01 DIAGNOSIS — G4733 Obstructive sleep apnea (adult) (pediatric): Secondary | ICD-10-CM | POA: Diagnosis not present

## 2022-07-03 ENCOUNTER — Other Ambulatory Visit: Payer: Self-pay | Admitting: Family Medicine

## 2022-07-03 DIAGNOSIS — R69 Illness, unspecified: Secondary | ICD-10-CM | POA: Diagnosis not present

## 2022-07-03 DIAGNOSIS — F3130 Bipolar disorder, current episode depressed, mild or moderate severity, unspecified: Secondary | ICD-10-CM | POA: Diagnosis not present

## 2022-07-03 MED ORDER — AMOXICILLIN-POT CLAVULANATE 875-125 MG PO TABS: 1.0000 | ORAL_TABLET | Freq: Two times a day (BID) | ORAL | 0 refills | Status: DC

## 2022-07-03 NOTE — Progress Notes (Signed)
[name], I am covering for [facility] while she is out of the office.  They did not see anything worrisome on your head scan.  They did see just a little bit of volume loss which is typically seen with a 2 that is not unusual.  They did see what look like a pretty bad sinus infection on the right side.  I am not sure where you have been having your headaches.  But I would like to put you on a round of antibiotics for a little bit longer than a typical course to see if your headaches feel better.  Georgina Peer go ahead and send that to your pharmacy let us know if you have any problems and if you are not feeling better after the round of antibiotics then please let us know as well.

## 2022-07-08 ENCOUNTER — Telehealth: Payer: Self-pay | Admitting: General Practice

## 2022-07-08 NOTE — Telephone Encounter (Signed)
Left message for patient to call and schedule annual/medicare wellness visit.

## 2022-07-18 DIAGNOSIS — G4733 Obstructive sleep apnea (adult) (pediatric): Secondary | ICD-10-CM | POA: Diagnosis not present

## 2022-07-31 DIAGNOSIS — G4733 Obstructive sleep apnea (adult) (pediatric): Secondary | ICD-10-CM | POA: Diagnosis not present

## 2022-08-05 ENCOUNTER — Encounter: Payer: Self-pay | Admitting: Physician Assistant

## 2022-08-05 ENCOUNTER — Ambulatory Visit (INDEPENDENT_AMBULATORY_CARE_PROVIDER_SITE_OTHER): Payer: Medicare HMO | Admitting: Physician Assistant

## 2022-08-05 VITALS — BP 139/74 | HR 67 | Ht 70.0 in | Wt 194.0 lb

## 2022-08-05 DIAGNOSIS — J320 Chronic maxillary sinusitis: Secondary | ICD-10-CM | POA: Diagnosis not present

## 2022-08-05 DIAGNOSIS — G250 Essential tremor: Secondary | ICD-10-CM

## 2022-08-05 DIAGNOSIS — M5136 Other intervertebral disc degeneration, lumbar region: Secondary | ICD-10-CM | POA: Diagnosis not present

## 2022-08-05 MED ORDER — FLUTICASONE PROPIONATE 50 MCG/ACT NA SUSP: 2.0000 | Freq: Every day | NASAL | 2 refills | Status: AC

## 2022-08-05 MED ORDER — GABAPENTIN 300 MG PO CAPS: 300.0000 mg | ORAL_CAPSULE | Freq: Three times a day (TID) | ORAL | 3 refills | Status: DC

## 2022-08-05 MED ORDER — AMOXICILLIN-POT CLAVULANATE 875-125 MG PO TABS: 1.0000 | ORAL_TABLET | Freq: Two times a day (BID) | ORAL | 0 refills | Status: DC

## 2022-08-05 NOTE — Patient Instructions (Signed)
Flonase 2 sprays each nostril daily Augmentin for the next 2 weeks Continue gabapentin and flomax

## 2022-08-05 NOTE — Progress Notes (Signed)
Established Patient Office Visit  Subjective   Patient ID: Alexander [name], male    DOB: 1951-02-01  Age: 72 y.o. MRN: [id number]  Chief Complaint  Patient presents with   Follow-up    HPI Pt is a 72 yo male who presents to the clinic for 1 month follow up. Last visit he was not feeling well at all. He had had a fall with head injury and he was very confused with memory loss. His essential tremor was worsening. He had ongoing headache. MRI showed sinusitis. Treated with augmentin and felt better but since he has been off some of his headache and symptom started to come back. He has a lot of sinus congestion and pressure. Tremor better with gabapentin. No vision changes. Memory loss and confusion resolved.  .. Active Ambulatory Problems    Diagnosis Date Noted   Essential hypertension, benign 09/30/2013   Barrett's esophagus 09/30/2013   Impaired fasting glucose 09/30/2013   BPH (benign prostatic hyperplasia) 09/30/2013   Bipolar 1 disorder, mixed ([place]) 09/30/2013   Generalized anxiety disorder 09/30/2013   Hyperlipidemia 10/03/2013   History of diverticulitis 10/20/2013   Preventive measure 10/20/2013   OSA on CPAP 12/28/2013   Osteoarthritis of carpometacarpal joint of left thumb 01/05/2015   Ear mass, left 06/06/2016   Chronic pain of right knee 03/25/2017   Seborrheic keratoses 03/25/2017   RLS (restless legs syndrome) 03/25/2017   Diarrhea 03/25/2017   Hematoma 04/24/2017   Lipoma of neck 04/24/2017   Acute non-recurrent pansinusitis 08/12/2017   Memory changes 08/30/2017   Elevated fasting glucose 08/30/2017   Reactive airway disease that is not asthma 09/13/2017   Bilateral lower extremity edema 09/13/2017   Low libido 10/08/2017   Irritable bowel syndrome with diarrhea 10/08/2017   Irregular heart beat 01/27/2018   LVH (left ventricular hypertrophy) 01/27/2018   Neck pain 01/27/2018   Serum calcium elevated 01/27/2018   Loose stools 01/27/2018   DDD  (degenerative disc disease), cervical 01/29/2018   Accessory skin tags 04/28/2018   Erectile dysfunction 07/15/2018   Cervical spondylosis 09/15/2018   Chronic pain disorder 11/01/2018   NSAID induced gastritis 01/20/2019   Sinus bradycardia 04/04/2019   Herniation of cervical intervertebral disc with radiculopathy 04/19/2019   Fusion of spine of cervical region 04/19/2019   PVC (premature ventricular contraction) 06/15/2019   Elevated PTHrP level 12/30/2019   Elevated calcitonin level 12/30/2019   Essential tremor 12/30/2019   Arthralgia of multiple joints 12/30/2019   Hyperparathyroidism due to lithium therapy ([place]) 03/30/2020   Primary osteoarthritis of left knee 04/01/2021   Lumbar degenerative disc disease 04/09/2021   Bilateral primary osteoarthritis of hip 10/03/2021   Nausea 10/25/2021   Hoarseness 10/25/2021   Prolonged Q-T interval on ECG 11/22/2021   Labile blood pressure 11/22/2021   Taste impairment 11/22/2021   Palpitations 11/22/2021   Dizziness 11/22/2021   Diverticulitis 2007   GERD (gastroesophageal reflux disease) 12/24/2021   Head injury 06/24/2022   Fall 06/24/2022   Worsening headaches 06/24/2022   Vision changes 06/24/2022   Blood in semen 06/24/2022   Chronic right maxillary sinusitis 08/13/2022   Resolved Ambulatory Problems    Diagnosis Date Noted   Need for influenza vaccination 03/25/2017   Epidermal cyst 03/25/2017   Excessive bleeding 04/24/2017   Cough 08/30/2017   Vision changes 10/08/2017   Elevated bilirubin 01/27/2018   Osteoarthritis of facet joint of cervical spine 04/26/2018   Cervical radiculopathy 11/01/2018   Spinal stenosis of cervical region 11/05/2018  Other spondylosis with radiculopathy, cervical region 04/19/2019   Status post cervical spinal fusion 03/30/2020   Past Medical History:  Diagnosis Date   Pounding heartbeat 11/22/2021     ROS   See HPI.  Objective:     BP 139/74 (BP Location: Left Arm, Patient  Position: Sitting, Cuff Size: Large)   Pulse 67   Ht '5\' 10"'$  (1.778 m)   Wt 194 lb 0.6 oz (88 kg)   SpO2 94%   BMI 27.84 kg/m  BP Readings from Last 3 Encounters:  08/05/22 139/74  06/24/22 (!) 154/66  05/05/22 (!) 152/86   Wt Readings from Last 3 Encounters:  08/05/22 194 lb 0.6 oz (88 kg)  06/24/22 186 lb (84.4 kg)  05/05/22 202 lb 0.6 oz (91.6 kg)      Physical Exam Vitals reviewed.  Constitutional:      Appearance: Normal appearance. He is obese.  HENT:     Head: Normocephalic.     Comments: Tenderness over maxillary sinuses to palpation.     Right Ear: Tympanic membrane, ear canal and external ear normal. There is no impacted cerumen.     Left Ear: Tympanic membrane, ear canal and external ear normal. There is no impacted cerumen.     Nose: Congestion present.     Mouth/Throat:     Mouth: Mucous membranes are moist.     Pharynx: No posterior oropharyngeal erythema.  Eyes:     Conjunctiva/sclera: Conjunctivae normal.  Cardiovascular:     Rate and Rhythm: Normal rate and regular rhythm.  Pulmonary:     Effort: Pulmonary effort is normal.  Musculoskeletal:     Cervical back: No tenderness.  Lymphadenopathy:     Cervical: No cervical adenopathy.  Neurological:     Mental Status: He is alert.  Psychiatric:        Mood and Affect: Mood normal.      The 10-year ASCVD risk score (Arnett DK, et al., 2019) is: 23.3%    Assessment & Plan:  [name] KitchenMarland KitchenHikeem Andersson was seen today for follow-up.  Diagnoses and all orders for this visit:  Chronic right maxillary sinusitis -     amoxicillin-clavulanate (AUGMENTIN) 875-125 MG tablet; Take 1 tablet by mouth 2 (two) times daily. -     fluticasone (FLONASE) 50 MCG/ACT nasal spray; Place 2 sprays into both nostrils daily.  Lumbar degenerative disc disease -     gabapentin (NEURONTIN) 300 MG capsule; Take 1 capsule (300 mg total) by mouth 3 (three) times daily.  Essential tremor -     gabapentin (NEURONTIN) 300 MG capsule; Take 1  capsule (300 mg total) by mouth 3 (three) times daily.   Ongoing chronic sinusitis that gets better with abx short term but comes back Sent augmentin for 1 month for chronic sinusitis treatment with flonase Continue gabapentin, sent refills.   Iran Planas, PA-C

## 2022-08-13 DIAGNOSIS — J320 Chronic maxillary sinusitis: Secondary | ICD-10-CM | POA: Insufficient documentation

## 2022-08-21 DIAGNOSIS — R69 Illness, unspecified: Secondary | ICD-10-CM | POA: Diagnosis not present

## 2022-08-21 DIAGNOSIS — F3130 Bipolar disorder, current episode depressed, mild or moderate severity, unspecified: Secondary | ICD-10-CM | POA: Diagnosis not present

## 2022-08-31 DIAGNOSIS — G4733 Obstructive sleep apnea (adult) (pediatric): Secondary | ICD-10-CM | POA: Diagnosis not present

## 2022-09-18 ENCOUNTER — Other Ambulatory Visit: Payer: Self-pay | Admitting: Physician Assistant

## 2022-09-18 DIAGNOSIS — I10 Essential (primary) hypertension: Secondary | ICD-10-CM

## 2022-09-19 ENCOUNTER — Other Ambulatory Visit: Payer: Self-pay | Admitting: Physician Assistant

## 2022-09-19 DIAGNOSIS — I10 Essential (primary) hypertension: Secondary | ICD-10-CM

## 2022-10-06 ENCOUNTER — Ambulatory Visit (INDEPENDENT_AMBULATORY_CARE_PROVIDER_SITE_OTHER): Payer: Medicare HMO | Admitting: Physician Assistant

## 2022-10-06 VITALS — BP 158/82 | HR 61 | Ht 70.0 in | Wt 202.0 lb

## 2022-10-06 DIAGNOSIS — Z79899 Other long term (current) drug therapy: Secondary | ICD-10-CM

## 2022-10-06 DIAGNOSIS — G250 Essential tremor: Secondary | ICD-10-CM | POA: Diagnosis not present

## 2022-10-06 DIAGNOSIS — L299 Pruritus, unspecified: Secondary | ICD-10-CM | POA: Diagnosis not present

## 2022-10-06 DIAGNOSIS — G8929 Other chronic pain: Secondary | ICD-10-CM

## 2022-10-06 DIAGNOSIS — M2011 Hallux valgus (acquired), right foot: Secondary | ICD-10-CM | POA: Diagnosis not present

## 2022-10-06 DIAGNOSIS — E782 Mixed hyperlipidemia: Secondary | ICD-10-CM

## 2022-10-06 DIAGNOSIS — I10 Essential (primary) hypertension: Secondary | ICD-10-CM

## 2022-10-06 DIAGNOSIS — M25562 Pain in left knee: Secondary | ICD-10-CM

## 2022-10-06 MED ORDER — CELECOXIB 200 MG PO CAPS: ORAL_CAPSULE | ORAL | 1 refills | Status: DC

## 2022-10-06 MED ORDER — ATORVASTATIN CALCIUM 20 MG PO TABS: 20.0000 mg | ORAL_TABLET | Freq: Every day | ORAL | 3 refills | Status: DC

## 2022-10-06 MED ORDER — DOXAZOSIN MESYLATE 4 MG PO TABS: 4.0000 mg | ORAL_TABLET | Freq: Two times a day (BID) | ORAL | 1 refills | Status: DC

## 2022-10-06 MED ORDER — BENAZEPRIL HCL 20 MG PO TABS: 20.0000 mg | ORAL_TABLET | Freq: Every day | ORAL | 1 refills | Status: DC

## 2022-10-06 MED ORDER — HYDROXYZINE HCL 10 MG PO TABS: 10.0000 mg | ORAL_TABLET | Freq: Three times a day (TID) | ORAL | 0 refills | Status: DC | PRN

## 2022-10-06 NOTE — Patient Instructions (Addendum)
Consider bunion spacers  Vistaril as needed for itching  Increase gabapentin to 1 in the morning, 1 in the afternoon, 2 in the evening.

## 2022-10-07 LAB — COMPLETE METABOLIC PANEL WITH GFR
AG Ratio: 2 (calc) (ref 1.0–2.5)
ALT: 26 U/L (ref 9–46)
AST: 21 U/L (ref 10–35)
Albumin: 4.5 g/dL (ref 3.6–5.1)
Alkaline phosphatase (APISO): 85 U/L (ref 35–144)
BUN: 14 mg/dL (ref 7–25)
CO2: 27 mmol/L (ref 20–32)
Calcium: 11.1 mg/dL — ABNORMAL HIGH (ref 8.6–10.3)
Chloride: 109 mmol/L (ref 98–110)
Creat: 0.82 mg/dL (ref 0.70–1.28)
Globulin: 2.3 g/dL (calc) (ref 1.9–3.7)
Glucose, Bld: 116 mg/dL — ABNORMAL HIGH (ref 65–99)
Potassium: 4.1 mmol/L (ref 3.5–5.3)
Sodium: 143 mmol/L (ref 135–146)
Total Bilirubin: 1.6 mg/dL — ABNORMAL HIGH (ref 0.2–1.2)
Total Protein: 6.8 g/dL (ref 6.1–8.1)
eGFR: 93 mL/min/{1.73_m2} (ref >=60)

## 2022-10-07 LAB — TSH: TSH: 1.45 mIU/L (ref 0.40–4.50)

## 2022-10-07 LAB — LITHIUM LEVEL: Lithium Lvl: 0.3 mmol/L — ABNORMAL LOW (ref 0.6–1.2)

## 2022-10-07 NOTE — Progress Notes (Signed)
Labs are stable.   JJ please send to Dr. Ardine Eng.

## 2022-10-13 ENCOUNTER — Encounter: Payer: Self-pay | Admitting: Physician Assistant

## 2022-10-13 DIAGNOSIS — L299 Pruritus, unspecified: Secondary | ICD-10-CM | POA: Insufficient documentation

## 2022-10-13 NOTE — Progress Notes (Signed)
Established Patient Office Visit  Subjective   Patient ID: Alexander [name], male    DOB: 1950/11/05  Age: 72 y.o. MRN: [id number]  Chief Complaint  Patient presents with   Follow-up    HPI Pt is a 72 yo male with Bipolar, HTN, palpitations, Q-T prolongation, Essential Tremor, and lumbar DDD who presents to the clinic for follow up.   He is doing well. His tremor is much better. His pain is controlled. His mood is good. He continues to have some itching sensation or like bugs crawling on him but nothing his there.   Active Ambulatory Problems    Diagnosis Date Noted   Essential hypertension, benign 09/30/2013   Barrett's esophagus 09/30/2013   Impaired fasting glucose 09/30/2013   BPH (benign prostatic hyperplasia) 09/30/2013   Bipolar 1 disorder, mixed ([place]) 09/30/2013   Generalized anxiety disorder 09/30/2013   Hyperlipidemia 10/03/2013   History of diverticulitis 10/20/2013   Preventive measure 10/20/2013   OSA on CPAP 12/28/2013   Osteoarthritis of carpometacarpal joint of left thumb 01/05/2015   Ear mass, left 06/06/2016   Chronic pain of right knee 03/25/2017   Seborrheic keratoses 03/25/2017   RLS (restless legs syndrome) 03/25/2017   Diarrhea 03/25/2017   Hematoma 04/24/2017   Lipoma of neck 04/24/2017   Acute non-recurrent pansinusitis 08/12/2017   Memory changes 08/30/2017   Elevated fasting glucose 08/30/2017   Reactive airway disease that is not asthma 09/13/2017   Bilateral lower extremity edema 09/13/2017   Low libido 10/08/2017   Irritable bowel syndrome with diarrhea 10/08/2017   Irregular heart beat 01/27/2018   LVH (left ventricular hypertrophy) 01/27/2018   Neck pain 01/27/2018   Serum calcium elevated 01/27/2018   Loose stools 01/27/2018   DDD (degenerative disc disease), cervical 01/29/2018   Accessory skin tags 04/28/2018   Erectile dysfunction 07/15/2018   Cervical spondylosis 09/15/2018   Chronic pain disorder 11/01/2018   NSAID induced  gastritis 01/20/2019   Sinus bradycardia 04/04/2019   Herniation of cervical intervertebral disc with radiculopathy 04/19/2019   Fusion of spine of cervical region 04/19/2019   PVC (premature ventricular contraction) 06/15/2019   Elevated PTHrP level 12/30/2019   Elevated calcitonin level 12/30/2019   Essential tremor 12/30/2019   Arthralgia of multiple joints 12/30/2019   Hyperparathyroidism due to lithium therapy ([place]) 03/30/2020   Primary osteoarthritis of left knee 04/01/2021   Lumbar degenerative disc disease 04/09/2021   Bilateral primary osteoarthritis of hip 10/03/2021   Nausea 10/25/2021   Hoarseness 10/25/2021   Prolonged Q-T interval on ECG 11/22/2021   Labile blood pressure 11/22/2021   Taste impairment 11/22/2021   Palpitations 11/22/2021   Dizziness 11/22/2021   Diverticulitis 2007   GERD (gastroesophageal reflux disease) 12/24/2021   Head injury 06/24/2022   Fall 06/24/2022   Worsening headaches 06/24/2022   Vision changes 06/24/2022   Blood in semen 06/24/2022   Chronic right maxillary sinusitis 08/13/2022   Pruritus 10/13/2022   Resolved Ambulatory Problems    Diagnosis Date Noted   Need for influenza vaccination 03/25/2017   Epidermal cyst 03/25/2017   Excessive bleeding 04/24/2017   Cough 08/30/2017   Vision changes 10/08/2017   Elevated bilirubin 01/27/2018   Osteoarthritis of facet joint of cervical spine 04/26/2018   Cervical radiculopathy 11/01/2018   Spinal stenosis of cervical region 11/05/2018   Other spondylosis with radiculopathy, cervical region 04/19/2019   Status post cervical spinal fusion 03/30/2020   Past Medical History:  Diagnosis Date   Pounding heartbeat 11/22/2021  ROS   See HPI.  Objective:     BP (!) 158/82   Pulse 61   Ht '5\' 10"'$  (1.778 m)   Wt 202 lb (91.6 kg)   SpO2 99%   BMI 28.98 kg/m  BP Readings from Last 3 Encounters:  10/06/22 (!) 158/82  08/05/22 139/74  06/24/22 (!) 154/66   Wt Readings from Last 3  Encounters:  10/06/22 202 lb (91.6 kg)  08/05/22 194 lb 0.6 oz (88 kg)  06/24/22 186 lb (84.4 kg)    ..    10/06/2022    9:10 AM 08/05/2022    9:15 AM 05/05/2022    9:24 AM 01/02/2022    8:16 AM 11/22/2021    8:59 AM  Depression screen PHQ 2/9  Decreased Interest 0 0 0 0 0  Down, Depressed, Hopeless 0 0 0 0 0  PHQ - 2 Score 0 0 0 0 0  Altered sleeping   0    Tired, decreased energy   1    Change in appetite   0    Feeling bad or failure about yourself    0    Trouble concentrating   2    Moving slowly or fidgety/restless   0    Suicidal thoughts   0    PHQ-9 Score   3    Difficult doing work/chores   Not difficult at all     .[name]    05/05/2022    9:24 AM 04/01/2021    9:26 AM 10/01/2020   10:44 AM 12/28/2019   10:52 AM  GAD 7 : Generalized Anxiety Score  Nervous, Anxious, on Edge 0 0 1 1  Control/stop worrying 0 0 0 0  Worry too much - different things 0 0 0 0  Trouble relaxing 0 0 0 1  Restless 1 0 0 2  Easily annoyed or irritable 1 0 0 0  Afraid - awful might happen 0 0 0 0  Total GAD 7 Score 2 0 1 4  Anxiety Difficulty Not difficult at all Not difficult at all Not difficult at all Somewhat difficult      Physical Exam Constitutional:      Appearance: Normal appearance. He is obese.  HENT:     Head: Normocephalic.  Neck:     Vascular: No carotid bruit.  Cardiovascular:     Rate and Rhythm: Normal rate and regular rhythm.  Pulmonary:     Effort: Pulmonary effort is normal.     Breath sounds: Normal breath sounds.  Musculoskeletal:     Cervical back: Normal range of motion and neck supple. No rigidity or tenderness.     Right lower leg: No edema.     Left lower leg: No edema.  Lymphadenopathy:     Cervical: No cervical adenopathy.  Neurological:     General: No focal deficit present.     Mental Status: He is alert and oriented to person, place, and time.  Psychiatric:        Mood and Affect: Mood normal.        Behavior: Behavior normal.      Results for  orders placed or performed in visit on 10/06/22  TSH  Result Value Ref Range   TSH 1.45 0.40 - 4.50 mIU/L  Lithium level  Result Value Ref Range   Lithium Lvl 0.3 (L) 0.6 - 1.2 mmol/L  COMPLETE METABOLIC PANEL WITH GFR  Result Value Ref Range   Glucose, Bld 116 (H) 65 - 99  mg/dL   BUN 14 7 - 25 mg/dL   Creat 0.82 0.70 - 1.28 mg/dL   eGFR 93 > OR = 60 mL/min/1.9m   BUN/Creatinine Ratio SEE NOTE: 6 - 22 (calc)   Sodium 143 135 - 146 mmol/L   Potassium 4.1 3.5 - 5.3 mmol/L   Chloride 109 98 - 110 mmol/L   CO2 27 20 - 32 mmol/L   Calcium 11.1 (H) 8.6 - 10.3 mg/dL   Total Protein 6.8 6.1 - 8.1 g/dL   Albumin 4.5 3.6 - 5.1 g/dL   Globulin 2.3 1.9 - 3.7 g/dL (calc)   AG Ratio 2.0 1.0 - 2.5 (calc)   Total Bilirubin 1.6 (H) 0.2 - 1.2 mg/dL   Alkaline phosphatase (APISO) 85 35 - 144 U/L   AST 21 10 - 35 U/L   ALT 26 9 - 46 U/L     The 10-year ASCVD risk score (Arnett DK, et al., 2019) is: 28.3%    Assessment & Plan:  .[name] KitchenMarland KitchenRaylon Thagardwas seen today for follow-up.  Diagnoses and all orders for this visit:  Essential hypertension, benign -     benazepril (LOTENSIN) 20 MG tablet; Take 1 tablet (20 mg total) by mouth daily. For your blood pressure. -     doxazosin (CARDURA) 4 MG tablet; Take 1 tablet (4 mg total) by mouth 2 (two) times daily. For your prostate symptoms.  Medication management -     TSH -     Lithium level -     Creatinine -     COMPLETE METABOLIC PANEL WITH GFR  Essential tremor -     TSH -     Lithium level  Mixed hyperlipidemia -     atorvastatin (LIPITOR) 20 MG tablet; Take 1 tablet (20 mg total) by mouth daily. For your cholesterol.  Chronic pain of left knee -     celecoxib (CELEBREX) 200 MG capsule; TAKE 1 CAPSULE BY MOUTH TWICE A DAY for your joint pain.  Hallux valgus of right foot  Pruritus -     hydrOXYzine (ATARAX) 10 MG tablet; Take 1 tablet (10 mg total) by mouth 3 (three) times daily as needed.   BP not to goal Continue to monitor at  home Labs for medication management ordered today  Refills sent Discussed bunion spacers for feet Vistaril as needed for itching Increased gabapentin 1 in the morning, 1 in the afternoon, 2 in the evening.    Return in about 3 months (around 01/06/2023).    JIran Planas PA-C

## 2022-10-29 ENCOUNTER — Other Ambulatory Visit: Payer: Self-pay | Admitting: Physician Assistant

## 2022-10-29 DIAGNOSIS — L299 Pruritus, unspecified: Secondary | ICD-10-CM

## 2022-11-24 DIAGNOSIS — H524 Presbyopia: Secondary | ICD-10-CM | POA: Diagnosis not present

## 2022-11-24 DIAGNOSIS — H5213 Myopia, bilateral: Secondary | ICD-10-CM | POA: Diagnosis not present

## 2022-11-24 DIAGNOSIS — H2513 Age-related nuclear cataract, bilateral: Secondary | ICD-10-CM | POA: Diagnosis not present

## 2022-12-16 ENCOUNTER — Telehealth: Payer: Self-pay | Admitting: Physician Assistant

## 2022-12-16 NOTE — Telephone Encounter (Signed)
Contacted Alexander [name] to schedule their annual wellness visit. Patient declined to schedule AWV at this time.Pt does not want another call.  *([name]*

## 2023-01-06 ENCOUNTER — Ambulatory Visit (INDEPENDENT_AMBULATORY_CARE_PROVIDER_SITE_OTHER): Payer: Medicare HMO | Admitting: Physician Assistant

## 2023-01-06 ENCOUNTER — Encounter: Payer: Self-pay | Admitting: Physician Assistant

## 2023-01-06 VITALS — BP 100/48 | HR 64 | Ht 70.0 in | Wt 209.1 lb

## 2023-01-06 DIAGNOSIS — G8929 Other chronic pain: Secondary | ICD-10-CM

## 2023-01-06 DIAGNOSIS — I10 Essential (primary) hypertension: Secondary | ICD-10-CM

## 2023-01-06 DIAGNOSIS — Z79899 Other long term (current) drug therapy: Secondary | ICD-10-CM

## 2023-01-06 DIAGNOSIS — E782 Mixed hyperlipidemia: Secondary | ICD-10-CM

## 2023-01-06 DIAGNOSIS — B351 Tinea unguium: Secondary | ICD-10-CM

## 2023-01-06 DIAGNOSIS — G5603 Carpal tunnel syndrome, bilateral upper limbs: Secondary | ICD-10-CM

## 2023-01-06 DIAGNOSIS — F3160 Bipolar disorder, current episode mixed, unspecified: Secondary | ICD-10-CM

## 2023-01-06 DIAGNOSIS — L299 Pruritus, unspecified: Secondary | ICD-10-CM | POA: Diagnosis not present

## 2023-01-06 DIAGNOSIS — M25562 Pain in left knee: Secondary | ICD-10-CM

## 2023-01-06 MED ORDER — TERBINAFINE HCL 250 MG PO TABS
250.0000 mg | ORAL_TABLET | Freq: Every day | ORAL | 0 refills | Status: AC
Start: 2023-01-06 — End: 2023-03-31

## 2023-01-06 MED ORDER — CELECOXIB 200 MG PO CAPS: ORAL_CAPSULE | ORAL | 3 refills | Status: DC

## 2023-01-06 NOTE — Progress Notes (Signed)
Established Patient Office Visit  Subjective   Patient ID: Alexander [name], male    DOB: 02-02-1951  Age: 72 y.o. MRN: [id number]  Chief Complaint  Patient presents with   Follow-up    HPI Pt is a 72 yo male with HTN, OSA, GERD, essential tremor, Bipolar who presents to the clinic for follow up.   Pt's mood is good. He sees Dr. Marjo Bicker for medication management.   His itching of scalp continues. Not seen anything and denies any rash.   On CPAP and doing well. No concerns. Uses nightly for most of the night.   He is having some tingling of hands only but does not radiate up arm.     Active Ambulatory Problems    Diagnosis Date Noted   Essential hypertension, benign 09/30/2013   Barrett's esophagus 09/30/2013   Impaired fasting glucose 09/30/2013   BPH (benign prostatic hyperplasia) 09/30/2013   Bipolar 1 disorder, mixed ([facility]) 09/30/2013   Generalized anxiety disorder 09/30/2013   Hyperlipidemia 10/03/2013   History of diverticulitis 10/20/2013   Preventive measure 10/20/2013   OSA on CPAP 12/28/2013   Osteoarthritis of carpometacarpal joint of left thumb 01/05/2015   Ear mass, left 06/06/2016   Chronic pain of right knee 03/25/2017   Seborrheic keratoses 03/25/2017   RLS (restless legs syndrome) 03/25/2017   Diarrhea 03/25/2017   Hematoma 04/24/2017   Lipoma of neck 04/24/2017   Acute non-recurrent pansinusitis 08/12/2017   Memory changes 08/30/2017   Elevated fasting glucose 08/30/2017   Reactive airway disease that is not asthma 09/13/2017   Bilateral lower extremity edema 09/13/2017   Low libido 10/08/2017   Irritable bowel syndrome with diarrhea 10/08/2017   Irregular heart beat 01/27/2018   LVH (left ventricular hypertrophy) 01/27/2018   Neck pain 01/27/2018   Serum calcium elevated 01/27/2018   Loose stools 01/27/2018   DDD (degenerative disc disease), cervical 01/29/2018   Accessory skin tags 04/28/2018   Erectile dysfunction 07/15/2018   Cervical  spondylosis 09/15/2018   Chronic pain disorder 11/01/2018   NSAID induced gastritis 01/20/2019   Sinus bradycardia 04/04/2019   Herniation of cervical intervertebral disc with radiculopathy 04/19/2019   Fusion of spine of cervical region 04/19/2019   PVC (premature ventricular contraction) 06/15/2019   Elevated PTHrP level 12/30/2019   Elevated calcitonin level 12/30/2019   Essential tremor 12/30/2019   Arthralgia of multiple joints 12/30/2019   Hyperparathyroidism due to lithium therapy ([facility]) 03/30/2020   Primary osteoarthritis of left knee 04/01/2021   Lumbar degenerative disc disease 04/09/2021   Bilateral primary osteoarthritis of hip 10/03/2021   Nausea 10/25/2021   Hoarseness 10/25/2021   Prolonged Q-T interval on ECG 11/22/2021   Labile blood pressure 11/22/2021   Taste impairment 11/22/2021   Palpitations 11/22/2021   Dizziness 11/22/2021   Diverticulitis 2007   GERD (gastroesophageal reflux disease) 12/24/2021   Head injury 06/24/2022   Fall 06/24/2022   Worsening headaches 06/24/2022   Vision changes 06/24/2022   Blood in semen 06/24/2022   Chronic right maxillary sinusitis 08/13/2022   Pruritus 10/13/2022   Toenail fungus 01/06/2023   Bilateral carpal tunnel syndrome 01/16/2023   Itching 01/16/2023   Chronic pain of left knee 01/16/2023   Resolved Ambulatory Problems    Diagnosis Date Noted   Need for influenza vaccination 03/25/2017   Epidermal cyst 03/25/2017   Excessive bleeding 04/24/2017   Cough 08/30/2017   Vision changes 10/08/2017   Elevated bilirubin 01/27/2018   Osteoarthritis of facet joint of cervical spine 04/26/2018  Cervical radiculopathy 11/01/2018   Spinal stenosis of cervical region 11/05/2018   Other spondylosis with radiculopathy, cervical region 04/19/2019   Status post cervical spinal fusion 03/30/2020   Past Medical History:  Diagnosis Date   Pounding heartbeat 11/22/2021     ROS See HPI.    Objective:     BP (!) 100/48    Pulse 64   Ht 5\' 10"  (1.778 m)   Wt 209 lb 1.3 oz (94.8 kg)   SpO2 99%   BMI 30.00 kg/m  BP Readings from Last 3 Encounters:  01/06/23 (!) 100/48  10/06/22 (!) 158/82  08/05/22 139/74   Wt Readings from Last 3 Encounters:  01/06/23 209 lb 1.3 oz (94.8 kg)  10/06/22 202 lb (91.6 kg)  08/05/22 194 lb 0.6 oz (88 kg)     Physical Exam Constitutional:      Appearance: Normal appearance. He is obese.  HENT:     Head: Normocephalic.  Cardiovascular:     Rate and Rhythm: Normal rate.  Pulmonary:     Effort: Pulmonary effort is normal.     Breath sounds: Normal breath sounds.  Musculoskeletal:     Right lower leg: No edema.     Left lower leg: No edema.  Skin:    Comments: Multiple toenails with fungus(thick dystrophic and discolored)  Neurological:     General: No focal deficit present.     Mental Status: He is alert and oriented to person, place, and time.  Psychiatric:        Mood and Affect: Mood normal.      The 10-year ASCVD risk score (Arnett DK, et al., 2019) is: 13.8%    Assessment & Plan:  [name] KitchenMarland KitchenBlythe Clough was seen today for follow-up.  Diagnoses and all orders for this visit:  Essential hypertension, benign -     COMPLETE METABOLIC PANEL WITH GFR  Chronic pain of left knee -     celecoxib (CELEBREX) 200 MG capsule; TAKE 1 CAPSULE BY MOUTH TWICE A DAY for your joint pain.  Mixed hyperlipidemia -     Lipid Panel w/reflex Direct LDL  Medication management -     Lithium level -     COMPLETE METABOLIC PANEL WITH GFR -     TSH -     CBC w/Diff/Platelet -     Lipid Panel w/reflex Direct LDL  Bipolar 1 disorder, mixed ([facility]) -     Lithium level  Toenail fungus -     terbinafine (LAMISIL) 250 MG tablet; Take 1 tablet (250 mg total) by mouth daily.  Itching  Bilateral carpal tunnel syndrome   I would like Dr. Marjo Bicker opinion about cymbalta to be added for neurotic itching? Patient can ask and will submit request via my nurse  BP low today-continue to  watch at home and monitor for any dizziness Make sure to stay hydrated if out in the sun  Refilled celebrex for chronic OA pain.   Lamsil for toenail fungus Cmp to check liver function Follow up in 3 months  Get night splint for likely carpal tunnel  Return in about 3 months (around 04/08/2023) for Follow up medications.    Tandy Gaw, PA-C

## 2023-01-06 NOTE — Patient Instructions (Addendum)
Cymbalta ?try Start lamisil daily   Preventing Toenail Fungus from Recurring  Sanitize your shoes with Mycomist spray or a similar shoe sanitizer spray.  Follow the instructions on the bottle and dry them outside in the sun or with a hairdryer.  We also recommend repeating the sanitization once weekly in shoes you wear most often.  Throw away any shoes you have worn a significant amount without socks-fungus thrives in a warm moist environment and you want to avoid re-infection after your laser procedure  Bleach your socks with regular or color safe bleach  Change your socks regularly to keep your feet clean and dry (especially if you have sweaty feet)-if sweaty feet are a problem, let your doctor know-there is a great lotion that helps with this problem.  Clean your toenail clippers with alcohol before you use them if you do your own toenails and make sure to replace [employer] boards and orange sticks regularly  If you get regular pedicures, bring your own instruments or go to a spa that sterilizes their instruments in an autoclave.

## 2023-01-07 ENCOUNTER — Other Ambulatory Visit: Payer: Self-pay | Admitting: Physician Assistant

## 2023-01-07 LAB — CBC WITH DIFFERENTIAL/PLATELET
Absolute Monocytes: 499 cells/uL (ref 200–950)
Basophils Absolute: 70 cells/uL (ref 0–200)
Basophils Relative: 1.1 %
Eosinophils Absolute: 230 cells/uL (ref 15–500)
Eosinophils Relative: 3.6 %
HCT: 40 % (ref 38.5–50.0)
Hemoglobin: 13.3 g/dL (ref 13.2–17.1)
Lymphs Abs: 1472 cells/uL (ref 850–3900)
MCH: 31.8 pg (ref 27.0–33.0)
MCHC: 33.3 g/dL (ref 32.0–36.0)
MCV: 95.7 fL (ref 80.0–100.0)
MPV: 11.6 fL (ref 7.5–12.5)
Monocytes Relative: 7.8 %
Neutro Abs: 4128 cells/uL (ref 1500–7800)
Neutrophils Relative %: 64.5 %
Platelets: 178 10*3/uL (ref 140–400)
RBC: 4.18 10*6/uL — ABNORMAL LOW (ref 4.20–5.80)
RDW: 12.2 % (ref 11.0–15.0)
Total Lymphocyte: 23 %
WBC: 6.4 10*3/uL (ref 3.8–10.8)

## 2023-01-07 LAB — COMPLETE METABOLIC PANEL WITH GFR
AG Ratio: 1.9 (calc) (ref 1.0–2.5)
ALT: 28 U/L (ref 9–46)
AST: 22 U/L (ref 10–35)
Albumin: 4.5 g/dL (ref 3.6–5.1)
Alkaline phosphatase (APISO): 101 U/L (ref 35–144)
BUN: 16 mg/dL (ref 7–25)
CO2: 25 mmol/L (ref 20–32)
Calcium: 11.4 mg/dL — ABNORMAL HIGH (ref 8.6–10.3)
Chloride: 108 mmol/L (ref 98–110)
Creat: 0.94 mg/dL (ref 0.70–1.28)
Globulin: 2.4 g/dL (calc) (ref 1.9–3.7)
Glucose, Bld: 97 mg/dL (ref 65–99)
Potassium: 4.7 mmol/L (ref 3.5–5.3)
Sodium: 140 mmol/L (ref 135–146)
Total Bilirubin: 0.9 mg/dL (ref 0.2–1.2)
Total Protein: 6.9 g/dL (ref 6.1–8.1)
eGFR: 86 mL/min/{1.73_m2} (ref >=60)

## 2023-01-07 LAB — LIPID PANEL W/REFLEX DIRECT LDL
Cholesterol: 153 mg/dL (ref <200)
HDL: 54 mg/dL (ref >=40)
LDL Cholesterol (Calc): 72 mg/dL (calc)
Non-HDL Cholesterol (Calc): 99 mg/dL (calc) (ref <130)
Total CHOL/HDL Ratio: 2.8 (calc) (ref <5.0)
Triglycerides: 209 mg/dL — ABNORMAL HIGH (ref <150)

## 2023-01-07 LAB — LITHIUM LEVEL: Lithium Lvl: 0.6 mmol/L (ref 0.6–1.2)

## 2023-01-07 LAB — TSH: TSH: 1.47 mIU/L (ref 0.40–4.50)

## 2023-01-07 NOTE — Progress Notes (Signed)
Alexander [name],   Thyroid look great.  Lithium level looks good.  TG are elevated. Make sure taking fish oil daily. Limit sugars and carbs and try to get 150 minutes of exercise a week.   Please send labs to Dr. Marjo Bicker his Psychiatrist.

## 2023-01-13 ENCOUNTER — Encounter: Payer: Self-pay | Admitting: Physician Assistant

## 2023-01-16 ENCOUNTER — Encounter: Payer: Self-pay | Admitting: Physician Assistant

## 2023-01-16 DIAGNOSIS — G8929 Other chronic pain: Secondary | ICD-10-CM | POA: Insufficient documentation

## 2023-01-16 DIAGNOSIS — L299 Pruritus, unspecified: Secondary | ICD-10-CM | POA: Insufficient documentation

## 2023-01-16 DIAGNOSIS — G4733 Obstructive sleep apnea (adult) (pediatric): Secondary | ICD-10-CM | POA: Diagnosis not present

## 2023-01-16 DIAGNOSIS — G5603 Carpal tunnel syndrome, bilateral upper limbs: Secondary | ICD-10-CM | POA: Insufficient documentation

## 2023-01-16 NOTE — Progress Notes (Signed)
Attempted call to Dr. Marjo Bicker. Reached a general voice mail box ( not specific to Dr. Marjo Bicker) Left a voice mail message requesting a return call without leaving details of call.

## 2023-02-04 MED ORDER — DULOXETINE HCL 20 MG PO CPEP: 20.0000 mg | ORAL_CAPSULE | Freq: Every day | ORAL | 1 refills | Status: DC

## 2023-02-04 NOTE — Addendum Note (Signed)
Addended by: Jomarie Longs on: 02/04/2023 12:45 PM   Modules accepted: Orders

## 2023-02-15 DIAGNOSIS — G4733 Obstructive sleep apnea (adult) (pediatric): Secondary | ICD-10-CM | POA: Diagnosis not present

## 2023-02-19 DIAGNOSIS — F3130 Bipolar disorder, current episode depressed, mild or moderate severity, unspecified: Secondary | ICD-10-CM | POA: Diagnosis not present

## 2023-03-06 ENCOUNTER — Ambulatory Visit: Payer: Medicare HMO | Admitting: Physician Assistant

## 2023-03-06 ENCOUNTER — Encounter: Payer: Self-pay | Admitting: Physician Assistant

## 2023-03-06 VITALS — BP 125/71 | HR 81 | Ht 70.0 in | Wt 210.0 lb

## 2023-03-06 DIAGNOSIS — R031 Nonspecific low blood-pressure reading: Secondary | ICD-10-CM | POA: Insufficient documentation

## 2023-03-06 DIAGNOSIS — Z23 Encounter for immunization: Secondary | ICD-10-CM | POA: Diagnosis not present

## 2023-03-06 DIAGNOSIS — S61412A Laceration without foreign body of left hand, initial encounter: Secondary | ICD-10-CM | POA: Diagnosis not present

## 2023-03-06 DIAGNOSIS — L03114 Cellulitis of left upper limb: Secondary | ICD-10-CM | POA: Diagnosis not present

## 2023-03-06 MED ORDER — DOXYCYCLINE HYCLATE 100 MG PO TABS
100.0000 mg | ORAL_TABLET | Freq: Two times a day (BID) | ORAL | 0 refills | Status: DC
Start: 2023-03-06 — End: 2023-05-26

## 2023-03-06 MED ORDER — MUPIROCIN 2 % EX OINT
TOPICAL_OINTMENT | CUTANEOUS | 0 refills | Status: AC
Start: 2023-03-06 — End: ?

## 2023-03-06 NOTE — Progress Notes (Signed)
Acute Office Visit  Subjective:     Patient ID: Alexander [name], male    DOB: [birth year], 72 y.o.   MRN: [id number]  Chief Complaint  Patient presents with   Hand Injury    HPI Patient is in today for laceration of left dorsal hand in between index and middle finger with knife while cutting weeds in yard about 4 days ago. He has not sought any medical treatment. He has tried to keep clean on his own. Laceration continues to ooze and becoming red and tender around laceration. He is not sure when last tdap was.    .. Active Ambulatory Problems    Diagnosis Date Noted   Essential hypertension, benign 09/30/2013   Barrett's esophagus 09/30/2013   Impaired fasting glucose 09/30/2013   BPH (benign prostatic hyperplasia) 09/30/2013   Bipolar 1 disorder, mixed (HCC) 09/30/2013   Generalized anxiety disorder 09/30/2013   Hyperlipidemia 10/03/2013   History of diverticulitis 10/20/2013   Preventive measure 10/20/2013   OSA on CPAP 12/28/2013   Osteoarthritis of carpometacarpal joint of left thumb 01/05/2015   Ear mass, left 06/06/2016   Chronic pain of right knee 03/25/2017   Seborrheic keratoses 03/25/2017   RLS (restless legs syndrome) 03/25/2017   Diarrhea 03/25/2017   Hematoma 04/24/2017   Lipoma of neck 04/24/2017   Acute non-recurrent pansinusitis 08/12/2017   Memory changes 08/30/2017   Elevated fasting glucose 08/30/2017   Reactive airway disease that is not asthma 09/13/2017   Bilateral lower extremity edema 09/13/2017   Low libido 10/08/2017   Irritable bowel syndrome with diarrhea 10/08/2017   Irregular heart beat 01/27/2018   LVH (left ventricular hypertrophy) 01/27/2018   Neck pain 01/27/2018   Serum calcium elevated 01/27/2018   Loose stools 01/27/2018   DDD (degenerative disc disease), cervical 01/29/2018   Accessory skin tags 04/28/2018   Erectile dysfunction 07/15/2018   Cervical spondylosis 09/15/2018   Chronic pain disorder 11/01/2018   NSAID induced  gastritis 01/20/2019   Sinus bradycardia 04/04/2019   Herniation of cervical intervertebral disc with radiculopathy 04/19/2019   Fusion of spine of cervical region 04/19/2019   PVC (premature ventricular contraction) 06/15/2019   Elevated PTHrP level 12/30/2019   Elevated calcitonin level 12/30/2019   Essential tremor 12/30/2019   Arthralgia of multiple joints 12/30/2019   Hyperparathyroidism due to lithium therapy (HCC) 03/30/2020   Primary osteoarthritis of left knee 04/01/2021   Lumbar degenerative disc disease 04/09/2021   Bilateral primary osteoarthritis of hip 10/03/2021   Nausea 10/25/2021   Hoarseness 10/25/2021   Prolonged Q-T interval on ECG 11/22/2021   Labile blood pressure 11/22/2021   Taste impairment 11/22/2021   Palpitations 11/22/2021   Dizziness 11/22/2021   Diverticulitis 2007   GERD (gastroesophageal reflux disease) 12/24/2021   Head injury 06/24/2022   Fall 06/24/2022   Worsening headaches 06/24/2022   Vision changes 06/24/2022   Blood in semen 06/24/2022   Chronic right maxillary sinusitis 08/13/2022   Pruritus 10/13/2022   Toenail fungus 01/06/2023   Bilateral carpal tunnel syndrome 01/16/2023   Itching 01/16/2023   Chronic pain of left knee 01/16/2023   Low blood pressure reading 03/06/2023   Resolved Ambulatory Problems    Diagnosis Date Noted   Need for influenza vaccination 03/25/2017   Epidermal cyst 03/25/2017   Excessive bleeding 04/24/2017   Cough 08/30/2017   Vision changes 10/08/2017   Elevated bilirubin 01/27/2018   Osteoarthritis of facet joint of cervical spine 04/26/2018   Cervical radiculopathy 11/01/2018   Spinal stenosis  of cervical region 11/05/2018   Other spondylosis with radiculopathy, cervical region 04/19/2019   Status post cervical spinal fusion 03/30/2020   Past Medical History:  Diagnosis Date   Pounding heartbeat 11/22/2021     ROS See HPI.      Objective:    BP 125/71   Pulse 81   Ht 5\' 10"  (1.778 m)    Wt 210 lb (95.3 kg)   SpO2 99%   BMI 30.13 kg/m  BP Readings from Last 3 Encounters:  03/06/23 125/71  01/06/23 (!) 100/48  10/06/22 (!) 158/82   Wt Readings from Last 3 Encounters:  03/06/23 210 lb (95.3 kg)  01/06/23 209 lb 1.3 oz (94.8 kg)  10/06/22 202 lb (91.6 kg)      Physical Exam  Left dorsal hand in between index and middle finger 2cm by 2mm laceration that appears to be closing but macerated with purulent drainage and erythema surrounding with warmth and tenderness.       Assessment & Plan:  [name] KitchenMarland KitchenDorinda Hill L was seen today for hand injury.  Diagnoses and all orders for this visit:  Laceration of left hand without foreign body, initial encounter -     doxycycline (VIBRA-TABS) 100 MG tablet; Take 1 tablet (100 mg total) by mouth 2 (two) times daily. -     Tdap vaccine greater than or equal to 7yo IM -     mupirocin ointment (BACTROBAN) 2 %; Apply to affected area TID for 5 days.  Cellulitis of left upper extremity -     doxycycline (VIBRA-TABS) 100 MG tablet; Take 1 tablet (100 mg total) by mouth 2 (two) times daily. -     Tdap vaccine greater than or equal to 7yo IM -     mupirocin ointment (BACTROBAN) 2 %; Apply to affected area TID for 5 days.  Low blood pressure reading  Need for Tdap vaccination -     Tdap vaccine greater than or equal to 7yo IM   2016 last Tdap, given today because over 7 years Cleaned wound today with Hibiclens and placed topical Bactroban over it Start doxycycline for cellulitis Ice hand has needed Continue to keep dry and covered with bactroban for next 5 days Follow up as needed if symptoms not improving or worsening BP low but on 2nd recheck looked great No changes made  Tandy Gaw, PA-C

## 2023-03-06 NOTE — Patient Instructions (Signed)

## 2023-03-18 DIAGNOSIS — G4733 Obstructive sleep apnea (adult) (pediatric): Secondary | ICD-10-CM | POA: Diagnosis not present

## 2023-03-23 DIAGNOSIS — F3130 Bipolar disorder, current episode depressed, mild or moderate severity, unspecified: Secondary | ICD-10-CM | POA: Diagnosis not present

## 2023-04-03 ENCOUNTER — Other Ambulatory Visit: Payer: Self-pay | Admitting: Physician Assistant

## 2023-04-03 DIAGNOSIS — I10 Essential (primary) hypertension: Secondary | ICD-10-CM

## 2023-04-07 ENCOUNTER — Ambulatory Visit (HOSPITAL_COMMUNITY): Payer: Medicare HMO | Attending: Cardiology

## 2023-04-08 ENCOUNTER — Ambulatory Visit: Payer: Medicare HMO | Admitting: Physician Assistant

## 2023-04-08 ENCOUNTER — Telehealth (HOSPITAL_COMMUNITY): Payer: Self-pay | Admitting: Cardiology

## 2023-04-08 DIAGNOSIS — Z23 Encounter for immunization: Secondary | ICD-10-CM

## 2023-04-08 NOTE — Telephone Encounter (Signed)
Patient went to the wrong location for the echo on 04/07/23. I called to reschdule him and he states he is not going to have this year and will see [place] next year. I tried to arrange appt but he declined and asked me to call him back. Order wil expired before next year. Please create new order for 09/25 if that is acceptable for MD. Thank you.

## 2023-05-26 ENCOUNTER — Telehealth: Payer: Self-pay

## 2023-05-26 MED ORDER — ATORVASTATIN CALCIUM 40 MG PO TABS: 40.0000 mg | ORAL_TABLET | Freq: Every day | ORAL | 3 refills | Status: AC

## 2023-05-26 NOTE — Telephone Encounter (Signed)
Sent higher dose so he could take once a day 40mg .

## 2023-05-26 NOTE — Telephone Encounter (Signed)
Patient came by office stating that he has 5 pills left of his atorvastatin, due to him taking one in the morning and one in the afternoon, patient would like to know if he could get a refill, please advise, thanks.

## 2023-05-27 ENCOUNTER — Ambulatory Visit: Payer: Medicare HMO

## 2023-05-27 ENCOUNTER — Ambulatory Visit (INDEPENDENT_AMBULATORY_CARE_PROVIDER_SITE_OTHER): Payer: Medicare HMO | Admitting: Physician Assistant

## 2023-05-27 VITALS — BP 172/80 | HR 70 | Ht 70.0 in | Wt 207.0 lb

## 2023-05-27 DIAGNOSIS — M25462 Effusion, left knee: Secondary | ICD-10-CM | POA: Diagnosis not present

## 2023-05-27 DIAGNOSIS — M25562 Pain in left knee: Secondary | ICD-10-CM

## 2023-05-27 DIAGNOSIS — I10 Essential (primary) hypertension: Secondary | ICD-10-CM

## 2023-05-27 DIAGNOSIS — Z23 Encounter for immunization: Secondary | ICD-10-CM

## 2023-05-27 DIAGNOSIS — G8929 Other chronic pain: Secondary | ICD-10-CM | POA: Diagnosis not present

## 2023-05-27 DIAGNOSIS — M1712 Unilateral primary osteoarthritis, left knee: Secondary | ICD-10-CM | POA: Diagnosis not present

## 2023-05-27 NOTE — Patient Instructions (Signed)
Will get xray and then MRI and then consult with sports medicine likely.

## 2023-05-27 NOTE — Progress Notes (Signed)
Acute Office Visit  Subjective:     Patient ID: Alexander Alexander [name], male    DOB: [birth year], 72 y.o.   MRN: [id number]  Chief Complaint  Patient presents with   Knee Pain    Left knee pain progressively getting worse     Knee Pain    Patient is in today for Alexander [name] posterior/lateral knee pain. He states this has been going on for 3-6 months and has been progressively getting worse. The pain is constant and he notices it might be a little worse at night or when he is standing for prolonged periods of time. No known injury. The pain will radiate to the anterior portion of his Alexander [name] leg all the way to the dorsal side of his foot. He states that it has started to affect his gait and he is "gimpy". He has tried Voltaren gel and a hemp lotion with no relief. Denies any calf pain, SOB or CP. Denies any trauma or injury to the area.   .. Active Ambulatory Problems    Diagnosis Date Noted   Essential hypertension, benign 09/30/2013   Barrett's esophagus 09/30/2013   Impaired fasting glucose 09/30/2013   BPH (benign prostatic hyperplasia) 09/30/2013   Bipolar 1 disorder, mixed (HCC) 09/30/2013   Generalized anxiety disorder 09/30/2013   Hyperlipidemia 10/03/2013   History of diverticulitis 10/20/2013   Preventive measure 10/20/2013   OSA on CPAP 12/28/2013   Osteoarthritis of carpometacarpal joint of left thumb 01/05/2015   Ear mass, left 06/06/2016   Chronic pain of right knee 03/25/2017   Seborrheic keratoses 03/25/2017   RLS (restless legs syndrome) 03/25/2017   Diarrhea 03/25/2017   Hematoma 04/24/2017   Lipoma of neck 04/24/2017   Acute non-recurrent pansinusitis 08/12/2017   Memory changes 08/30/2017   Elevated fasting glucose 08/30/2017   Reactive airway disease without asthma 09/13/2017   Bilateral lower extremity edema 09/13/2017   Low libido 10/08/2017   Irritable bowel syndrome with diarrhea 10/08/2017   Irregular heart beat 01/27/2018   LVH (left ventricular hypertrophy)  01/27/2018   Neck pain 01/27/2018   Serum calcium elevated 01/27/2018   Loose stools 01/27/2018   DDD (degenerative disc disease), cervical 01/29/2018   Accessory skin tags 04/28/2018   Erectile dysfunction 07/15/2018   Cervical spondylosis 09/15/2018   Chronic pain disorder 11/01/2018   NSAID induced gastritis 01/20/2019   Sinus bradycardia 04/04/2019   Herniation of cervical intervertebral disc with radiculopathy 04/19/2019   Fusion of spine of cervical region 04/19/2019   PVC (premature ventricular contraction) 06/15/2019   Elevated PTHrP level 12/30/2019   Elevated calcitonin level 12/30/2019   Essential tremor 12/30/2019   Arthralgia of multiple joints 12/30/2019   Hyperparathyroidism due to lithium therapy (HCC) 03/30/2020   Primary osteoarthritis of left knee 04/01/2021   Lumbar degenerative disc disease 04/09/2021   Bilateral primary osteoarthritis of hip 10/03/2021   Nausea 10/25/2021   Hoarseness 10/25/2021   Prolonged Q-T interval on ECG 11/22/2021   Labile blood pressure 11/22/2021   Taste impairment 11/22/2021   Palpitations 11/22/2021   Dizziness 11/22/2021   Diverticulitis 2007   GERD (gastroesophageal reflux disease) 12/24/2021   Head injury 06/24/2022   Fall 06/24/2022   Worsening headaches 06/24/2022   Vision changes 06/24/2022   Blood in semen 06/24/2022   Chronic right maxillary sinusitis 08/13/2022   Pruritus 10/13/2022   Toenail fungus 01/06/2023   Bilateral carpal tunnel syndrome 01/16/2023   Itching 01/16/2023   Chronic pain of left knee 01/16/2023   Low  blood pressure reading 03/06/2023   Resolved Ambulatory Problems    Diagnosis Date Noted   Need for influenza vaccination 03/25/2017   Epidermal cyst 03/25/2017   Excessive bleeding 04/24/2017   Cough 08/30/2017   Vision changes 10/08/2017   Elevated bilirubin 01/27/2018   Osteoarthritis of facet joint of cervical spine 04/26/2018   Cervical radiculopathy 11/01/2018   Spinal stenosis of  cervical region 11/05/2018   Other spondylosis with radiculopathy, cervical region 04/19/2019   Status post cervical spinal fusion 03/30/2020   Past Medical History:  Diagnosis Date   Pounding heartbeat 11/22/2021   Reactive airway disease that is not asthma 09/13/2017     ROS See HPI     Objective:    BP (!) 172/80   Pulse 70   Ht 5\' 10"  (1.778 m)   Wt 207 lb (93.9 kg)   SpO2 99%   BMI 29.70 kg/m  BP Readings from Last 3 Encounters:  05/27/23 (!) 172/80  03/06/23 125/71  01/06/23 (!) 100/48   Wt Readings from Last 3 Encounters:  05/27/23 207 lb (93.9 kg)  03/06/23 210 lb (95.3 kg)  01/06/23 209 lb 1.3 oz (94.8 kg)      Physical Exam Constitutional:      Appearance: Normal appearance.  HENT:     Head: Normocephalic.  Musculoskeletal:        General: No swelling. Normal range of motion.     Right knee: Normal.     Left knee: No swelling, effusion, erythema or crepitus. Normal range of motion. No tenderness. No ACL laxity.    Right lower leg: No edema.     Left lower leg: No edema.     Comments: Negative Homan's sign on Alexander [name] calf. Some pain with McMurrays on lateral side of Alexander [name] knee. Strength 5/5 in Alexander [name] leg. No palpation of bakers cyst behind knee.  Normal ROM of left knee.  No evidence of swelling.  Some lateral joint tenderness to palpation.   Skin:    General: Skin is warm and dry.  Neurological:     General: No focal deficit present.     Mental Status: He is alert and oriented to person, place, and time.  Psychiatric:        Mood and Affect: Mood normal.        Behavior: Behavior normal.         Assessment & Plan:  [name] KitchenMarland KitchenDorinda Hill Alexander [name] was seen today for knee pain.  Diagnoses and all orders for this visit:  Chronic pain of left knee -     DG Knee Complete 4 Views Left; Future  Immunization due -     Flu Vaccine Trivalent High Dose (Fluad)  Essential hypertension, benign     Alexander [name] knee X ray to evaluate if pain from arthritis. MRI ordered to evaluate for  possible meniscal injury.  No swelling or signs of DVT or posterior knee mass like bakers cyst.  Consider follow up with Alexander Alexander [name] after xray for a knee injection Discussed knee sleeve for support  Continue voltaren gel and celebrex for anti-inflammatory benefits Elevated BP today and on recheck.  Return to clinic in 2 weeks for BP check Per patient he has been getting good readings at home under 140/90 Make sure taking all medications    Tandy Gaw, PA-C

## 2023-05-29 ENCOUNTER — Encounter: Payer: Self-pay | Admitting: Physician Assistant

## 2023-06-05 ENCOUNTER — Telehealth: Payer: Self-pay

## 2023-06-05 NOTE — Telephone Encounter (Signed)
I called [facility] and they have changed the order to stat.

## 2023-06-05 NOTE — Telephone Encounter (Signed)
Patient came office due to him not getting the results of hims MRI, patient states that his pain is now going down into his foot, I informed patient that imaging is short staffed and that I would inform PCP of him wanting the results, please advise, thanks.

## 2023-06-05 NOTE — Progress Notes (Signed)
Left knee xray shows arthritis more in medial joint. I would follow up with Dr. Karie Schwalbe and he could consider another knee injection.

## 2023-06-16 ENCOUNTER — Ambulatory Visit (INDEPENDENT_AMBULATORY_CARE_PROVIDER_SITE_OTHER): Payer: Medicare HMO | Admitting: Sports Medicine

## 2023-06-16 ENCOUNTER — Encounter: Payer: Self-pay | Admitting: Sports Medicine

## 2023-06-16 DIAGNOSIS — M1712 Unilateral primary osteoarthritis, left knee: Secondary | ICD-10-CM

## 2023-06-16 NOTE — Assessment & Plan Note (Signed)
This is a very pleasant 72 year old male, he has known left knee osteoarthritis, we did an injection approximately 2 years ago that worked well. He then saw another provider with recurrence of pain, he had some x-rays that confirmed his existing osteoarthritis. He has been applying a topical menthol containing cream that seems to be working well. He is happy, he can step up with the affected leg, he does have some problems stepping down, I explained the importance of aggressive strengthening, we will do this for 6 to 8 weeks before considering repeated injection.

## 2023-06-16 NOTE — Progress Notes (Signed)
    Procedures performed today:    None.  Independent interpretation of notes and tests performed by another provider:   None.  Brief History, Exam, Impression, and Recommendations:    Primary osteoarthritis of left knee This is a very pleasant 72 year old male, he has known left knee osteoarthritis, we did an injection approximately 2 years ago that worked well. He then saw another provider with recurrence of pain, he had some x-rays that confirmed his existing osteoarthritis. He has been applying a topical menthol containing cream that seems to be working well. He is happy, he can step up with the affected leg, he does have some problems stepping down, I explained the importance of aggressive strengthening, we will do this for 6 to 8 weeks before considering repeated injection.    ____________________________________________ Ihor Austin. Benjamin Stain, M.D., ABFM., CAQSM., AME. Primary Care and Sports Medicine [facility]  Adjunct Professor of Family Medicine  [employer]  Restaurant manager, fast food

## 2023-06-29 ENCOUNTER — Other Ambulatory Visit: Payer: Self-pay | Admitting: Physician Assistant

## 2023-06-29 DIAGNOSIS — L299 Pruritus, unspecified: Secondary | ICD-10-CM

## 2023-06-29 DIAGNOSIS — N401 Enlarged prostate with lower urinary tract symptoms: Secondary | ICD-10-CM

## 2023-07-03 ENCOUNTER — Other Ambulatory Visit: Payer: Self-pay | Admitting: Physician Assistant

## 2023-07-03 DIAGNOSIS — I10 Essential (primary) hypertension: Secondary | ICD-10-CM

## 2023-07-06 DIAGNOSIS — F3130 Bipolar disorder, current episode depressed, mild or moderate severity, unspecified: Secondary | ICD-10-CM | POA: Diagnosis not present

## 2023-08-12 ENCOUNTER — Encounter: Payer: Self-pay | Admitting: Family Medicine

## 2023-08-12 ENCOUNTER — Ambulatory Visit (INDEPENDENT_AMBULATORY_CARE_PROVIDER_SITE_OTHER): Payer: Medicare HMO | Admitting: Family Medicine

## 2023-08-12 ENCOUNTER — Ambulatory Visit: Payer: Medicare HMO

## 2023-08-12 VITALS — BP 158/64 | HR 60 | Ht 70.0 in | Wt 204.5 lb

## 2023-08-12 DIAGNOSIS — J329 Chronic sinusitis, unspecified: Secondary | ICD-10-CM

## 2023-08-12 DIAGNOSIS — R069 Unspecified abnormalities of breathing: Secondary | ICD-10-CM | POA: Diagnosis not present

## 2023-08-12 DIAGNOSIS — R0989 Other specified symptoms and signs involving the circulatory and respiratory systems: Secondary | ICD-10-CM | POA: Diagnosis not present

## 2023-08-12 DIAGNOSIS — I700 Atherosclerosis of aorta: Secondary | ICD-10-CM | POA: Diagnosis not present

## 2023-08-12 DIAGNOSIS — R059 Cough, unspecified: Secondary | ICD-10-CM | POA: Diagnosis not present

## 2023-08-12 DIAGNOSIS — J029 Acute pharyngitis, unspecified: Secondary | ICD-10-CM

## 2023-08-12 DIAGNOSIS — J40 Bronchitis, not specified as acute or chronic: Secondary | ICD-10-CM | POA: Diagnosis not present

## 2023-08-12 DIAGNOSIS — J302 Other seasonal allergic rhinitis: Secondary | ICD-10-CM | POA: Diagnosis not present

## 2023-08-12 DIAGNOSIS — R6889 Other general symptoms and signs: Secondary | ICD-10-CM

## 2023-08-12 LAB — POCT INFLUENZA A/B
Influenza A, POC: NEGATIVE
Influenza B, POC: NEGATIVE

## 2023-08-12 LAB — POC COVID19 BINAXNOW: SARS Coronavirus 2 Ag: NEGATIVE

## 2023-08-12 LAB — POCT RAPID STREP A (OFFICE): Rapid Strep A Screen: NEGATIVE

## 2023-08-12 MED ORDER — METHYLPREDNISOLONE 4 MG PO TBPK: ORAL_TABLET | ORAL | 0 refills | Status: DC

## 2023-08-12 MED ORDER — DOXYCYCLINE HYCLATE 100 MG PO TABS: 100.0000 mg | ORAL_TABLET | Freq: Two times a day (BID) | ORAL | 0 refills | Status: AC

## 2023-08-12 MED ORDER — HYDROCOD POLI-CHLORPHE POLI ER 10-8 MG/5ML PO SUER: 5.0000 mL | Freq: Two times a day (BID) | ORAL | 0 refills | Status: DC | PRN

## 2023-08-12 MED ORDER — CETIRIZINE HCL 10 MG PO TABS: 10.0000 mg | ORAL_TABLET | Freq: Every day | ORAL | 1 refills | Status: DC

## 2023-08-12 NOTE — Progress Notes (Signed)
 Acute Office Visit  Subjective:     Patient ID: Alexander [name], male    DOB: 08-19-1950, 73 y.o.   MRN: [id number]  Chief Complaint  Patient presents with   Cough    Chest rattling, runny nose x3days    HPI Patient is in today for acute visit of cough, congestion, and cold like symptoms.   Review of Systems  Constitutional:  Negative for chills and fever.  HENT:  Positive for congestion.   Respiratory:  Positive for cough. Negative for shortness of breath.   Cardiovascular:  Negative for chest pain.  Neurological:  Negative for headaches.        Objective:    BP (!) 158/64 (BP Location: Left Arm, Patient Position: Sitting, Cuff Size: Large)   Pulse 60   Ht 5' 10 (1.778 m)   Wt 204 lb 8 oz (92.8 kg)   SpO2 98%   BMI 29.34 kg/m    Physical Exam Vitals and nursing note reviewed.  Constitutional:      General: He is not in acute distress.    Appearance: Normal appearance.  HENT:     Head: Normocephalic and atraumatic.     Right Ear: External ear normal.     Left Ear: External ear normal.     Nose: Nose normal.  Eyes:     Conjunctiva/sclera: Conjunctivae normal.  Cardiovascular:     Rate and Rhythm: Normal rate and regular rhythm.  Pulmonary:     Effort: Pulmonary effort is normal.     Breath sounds: Wheezing and rales present.  Neurological:     General: No focal deficit present.     Mental Status: He is alert and oriented to person, place, and time.  Psychiatric:        Mood and Affect: Mood normal.        Behavior: Behavior normal.        Thought Content: Thought content normal.        Judgment: Judgment normal.     No results found for any visits on 08/12/23.      Assessment & Plan:   Problem List Items Addressed This Visit       Respiratory   Sinobronchitis - Primary   Poc covid, flu, and strep all negative - pt wheezing on exam and has productive, deep cough. 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      Relevant Medications   cetirizine  (ZYRTEC ) 10 MG tablet   Other Relevant Orders   Ambulatory referral to Allergy   Other Visit Diagnoses       Flu-like symptoms       Relevant Orders   POCT Influenza A/B     Sore throat  Relevant Orders   POCT rapid strep A     Runny nose       Relevant Orders   POC COVID-19       Meds ordered this encounter  Medications   methylPREDNISolone  (MEDROL  DOSEPAK) 4 MG TBPK tablet    Sig: Follow instructions on pill pack    Dispense:  21 tablet    Refill:  0   doxycycline  (VIBRA -TABS) 100 MG tablet    Sig: Take 1 tablet (100 mg total) by mouth 2 (two) times daily for 7 days.    Dispense:  14 tablet    Refill:  0   chlorpheniramine-HYDROcodone  (TUSSIONEX) 10-8 MG/5ML    Sig: Take 5 mLs by mouth every 12 (twelve) hours as needed for cough (cough, will cause drowsiness.).    Dispense:  120 mL    Refill:  0   cetirizine  (ZYRTEC ) 10 MG tablet    Sig: Take 1 tablet (10 mg total) by mouth daily.    Dispense:  90 tablet    Refill:  1    No follow-ups on file.  Bernice GORMAN [place], DO

## 2023-08-12 NOTE — Assessment & Plan Note (Signed)
 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

## 2023-08-12 NOTE — Assessment & Plan Note (Signed)
 Poc covid, flu, and strep all negative - pt wheezing on exam and has productive, deep cough. Will go ahead and treat with medrol  dose pack as well as doxycycline  - have also ordered a cxr to look for signs of pna due to some rattling heard on left upper and lower lobes

## 2023-09-30 ENCOUNTER — Telehealth: Payer: Self-pay | Admitting: *Deleted

## 2023-09-30 NOTE — Telephone Encounter (Signed)
 Dr. Elberta Fortis -- [place] Childes

## 2023-09-30 NOTE — Telephone Encounter (Signed)
-----   Message from [facility] sent at 09/29/2023  9:26 AM EST ----- Regarding: RE: Echo & OV Just spoke to patient, stated I was calling from Dr. Elberta Fortis office. Advised you had asked me to reach out to get him scheduled for the [facility] office. Patient states that you had said [place]. I let the patient know that you meant [facility], because he doesn't go to [place]. He said "lets just forget this, because there is too many this's and that's" and hung up.   Cassie ----- Message ----- From: Baird Lyons, RN Sent: 09/28/2023   8:42 AM EST To: Baird Lyons, RN; Cvd-Ep Scheduling Subject: FW: Echo & OV                                  I called and spoke to pt.  Can you schedule him for follow up w/ [facility] in [facility] office please. He is aware someone will contact him to arrange OV. ----- Message ----- From: Baird Lyons, RN Sent: 08/10/2023   8:00 AM EST To: Baird Lyons, RN Subject: Echo & OV                                      Spoke to pt  05/21/2023 @ 10:45 am  Followed for PVCs. Last seen 03/2022  Pt did not want echo or follow up until 2025. He "can't remember months away" and would like me to call him next year to arrange echo and f/u

## 2023-10-05 DIAGNOSIS — F3130 Bipolar disorder, current episode depressed, mild or moderate severity, unspecified: Secondary | ICD-10-CM | POA: Diagnosis not present

## 2023-10-07 ENCOUNTER — Ambulatory Visit
Admission: EM | Admit: 2023-10-07 | Discharge: 2023-10-07 | Disposition: A | Discharge status: Home / Self Care | Attending: Emergency Medicine | Admitting: Emergency Medicine

## 2023-10-07 ENCOUNTER — Encounter: Payer: Self-pay | Admitting: Emergency Medicine

## 2023-10-07 ENCOUNTER — Ambulatory Visit: Payer: Self-pay | Admitting: Physician Assistant

## 2023-10-07 DIAGNOSIS — M79605 Pain in left leg: Secondary | ICD-10-CM | POA: Diagnosis not present

## 2023-10-07 DIAGNOSIS — T148XXA Other injury of unspecified body region, initial encounter: Secondary | ICD-10-CM

## 2023-10-07 MED ORDER — PREDNISONE 20 MG PO TABS: 40.0000 mg | ORAL_TABLET | Freq: Every day | ORAL | 0 refills | Status: AC

## 2023-10-07 MED ORDER — BACLOFEN 5 MG PO TABS: 5.0000 mg | ORAL_TABLET | Freq: Two times a day (BID) | ORAL | 0 refills | Status: DC | PRN

## 2023-10-07 NOTE — ED Provider Notes (Signed)
 [name]    CSN: [id number] Arrival date & time: 10/07/23  1325      History   Chief Complaint Chief Complaint  Patient presents with   Leg Pain    HPI [name] is a 73 y.o. male.  3 day history of left thigh leg pain. Located between knee and hip only. Currently rating 8/10 pain with standing, better when sitting. Denies injury, trauma, or falls. He does do a lot of heavy lifting and moving around. No back pain. No weakness, numbness, or tingling.  Has used advil, last dose this morning   History of arthritis in the knee and hip, chronic pain of knee, lumbar degenerative disc disease Followed by orthopedics   He does not feel this is related to his bones. Feels more like a muscle to him  Past Medical History:  Diagnosis Date   Accessory skin tags 04/28/2018   Acute non-recurrent pansinusitis 08/12/2017   Arthralgia of multiple joints 12/30/2019   Barrett's esophagus 09/30/2013   Bilateral lower extremity edema 09/13/2017   Bilateral primary osteoarthritis of hip 10/03/2021   Bipolar 1 disorder, mixed (HCC) 09/30/2013   [facility], Dr. Marjo Bicker    BPH (benign prostatic hyperplasia) 09/30/2013   Cervical spondylosis 09/15/2018   Chronic pain disorder 11/01/2018   Chronic pain of right knee 03/25/2017   DDD (degenerative disc disease), cervical 01/29/2018   Diarrhea 03/25/2017   Diverticulitis 2007   Dizziness 11/22/2021   Ear mass, left 06/06/2016   Biopsy- Actinic keratosis via biopsy.    Elevated calcitonin level 12/30/2019   Elevated fasting glucose 08/30/2017   Elevated PTHrP level 12/30/2019   Erectile dysfunction 07/15/2018   Essential hypertension, benign 09/30/2013   Essential tremor 12/30/2019   Fusion of spine of cervical region 04/19/2019   Generalized anxiety disorder 09/30/2013   GERD (gastroesophageal reflux disease)    Hematoma 04/24/2017   Herniation of cervical intervertebral disc with radiculopathy 04/19/2019   C3-4   History of  diverticulitis 10/20/2013   With colonectomy    Hoarseness 10/25/2021   Hyperlipidemia 10/03/2013   AHA 10 year risk of 19.5% Feb 2015, encouraged to start Lipitor   Hyperparathyroidism due to lithium therapy (HCC) 03/30/2020   Per endocrinology as long as he has no sequela and calcium is 1mg /dL from upper limits of normal ok to stay on lithium. No extra calcium.    Impaired fasting glucose 09/30/2013   Irregular heart beat 01/27/2018   Irritable bowel syndrome with diarrhea 10/08/2017   Labile blood pressure 11/22/2021   Lipoma of neck 04/24/2017   Loose stools 01/27/2018   Low libido 10/08/2017   Lumbar degenerative disc disease 04/09/2021   LVH (left ventricular hypertrophy) 01/27/2018   Memory changes 08/30/2017   Nausea 10/25/2021   Neck pain 01/27/2018   NSAID induced gastritis 01/20/2019   OSA on CPAP 12/28/2013   Osteoarthritis of carpometacarpal joint of left thumb 01/05/2015   Pounding heartbeat 11/22/2021   Preventive measure 10/20/2013   Colonoscopy & EGD 2014 [facility] - Repeat EGD 2017, Colon 2019   Primary osteoarthritis of left knee 04/01/2021   Prolonged Q-T interval on ECG 11/22/2021   PVC (premature ventricular contraction) 06/15/2019   Reactive airway disease that is not asthma 09/13/2017   RLS (restless legs syndrome) 03/25/2017   Seborrheic keratoses 03/25/2017   Serum calcium elevated 01/27/2018   Sinus bradycardia 04/04/2019   Taste impairment 11/22/2021    Patient Active Problem List   Diagnosis Date Noted  Sinobronchitis 08/12/2023   Seasonal allergies 08/12/2023   Low blood pressure reading 03/06/2023   Bilateral carpal tunnel syndrome 01/16/2023   Itching 01/16/2023   Toenail fungus 01/06/2023   Pruritus 10/13/2022   Chronic right maxillary sinusitis 08/13/2022   Head injury 06/24/2022   Fall 06/24/2022   Worsening headaches 06/24/2022   Vision changes 06/24/2022   Blood in semen 06/24/2022   GERD (gastroesophageal reflux disease) 12/24/2021   Prolonged Q-T  interval on ECG 11/22/2021   Labile blood pressure 11/22/2021   Taste impairment 11/22/2021   Palpitations 11/22/2021   Dizziness 11/22/2021   Nausea 10/25/2021   Hoarseness 10/25/2021   Bilateral primary osteoarthritis of hip 10/03/2021   Lumbar degenerative disc disease 04/09/2021   Primary osteoarthritis of left knee 04/01/2021   Hyperparathyroidism due to lithium therapy (HCC) 03/30/2020   Elevated PTHrP level 12/30/2019   Elevated calcitonin level 12/30/2019   Essential tremor 12/30/2019   Arthralgia of multiple joints 12/30/2019   PVC (premature ventricular contraction) 06/15/2019   Herniation of cervical intervertebral disc with radiculopathy 04/19/2019    Class: Chronic   Fusion of spine of cervical region 04/19/2019   Sinus bradycardia 04/04/2019   NSAID induced gastritis 01/20/2019   Chronic pain disorder 11/01/2018   Cervical spondylosis 09/15/2018   Erectile dysfunction 07/15/2018   Accessory skin tags 04/28/2018   DDD (degenerative disc disease), cervical 01/29/2018   Irregular heart beat 01/27/2018   LVH (left ventricular hypertrophy) 01/27/2018   Neck pain 01/27/2018   Serum calcium elevated 01/27/2018   Loose stools 01/27/2018   Low libido 10/08/2017   Irritable bowel syndrome with diarrhea 10/08/2017   Reactive airway disease without asthma 09/13/2017   Bilateral lower extremity edema 09/13/2017   Memory changes 08/30/2017   Elevated fasting glucose 08/30/2017   Acute non-recurrent pansinusitis 08/12/2017   Hematoma 04/24/2017   Lipoma of neck 04/24/2017   Chronic pain of right knee 03/25/2017   Seborrheic keratoses 03/25/2017   RLS (restless legs syndrome) 03/25/2017   Diarrhea 03/25/2017   Ear mass, left 06/06/2016   Osteoarthritis of carpometacarpal joint of left thumb 01/05/2015   OSA on CPAP 12/28/2013   History of diverticulitis 10/20/2013   Preventive measure 10/20/2013   Hyperlipidemia 10/03/2013   Essential hypertension, benign 09/30/2013    Barrett's esophagus 09/30/2013   Impaired fasting glucose 09/30/2013   BPH (benign prostatic hyperplasia) 09/30/2013   Bipolar 1 disorder, mixed (HCC) 09/30/2013   Generalized anxiety disorder 09/30/2013   Diverticulitis 2007    Past Surgical History:  Procedure Laterality Date   ANTERIOR CERVICAL DECOMP/DISCECTOMY FUSION N/A 04/19/2019   Procedure: ANTERIOR CERVICAL DISCECTOMY FUSION cervical three to four, cervical four to five.;  Surgeon: Kerrin Champagne, MD;  Location: [facility] OR;  Service: Orthopedics;  Laterality: N/A;   COLOSTOMY REVERSAL     large bowel perforation         Home Medications    Prior to Admission medications   Medication Sig Start Date End Date Taking? Authorizing Provider  AMBULATORY NON FORMULARY MEDICATION Increase Auto-PAP machine with heated humidifier settings to 9-20cm H2O.  Dx: Obstructive sleep apnea. 11/26/16  Yes Breeback, Jade L, PA-C  amLODipine (NORVASC) 10 MG tablet TAKE 1 TABLET BY MOUTH EVERY DAY 07/07/23  Yes Breeback, Jade L, PA-C  aspirin 81 MG tablet Take 81 mg by mouth every other day.   Yes [provider]  atorvastatin (LIPITOR) 40 MG tablet Take 1 tablet (40 mg total) by mouth daily. 05/26/23  Yes Breeback, Jade L,  PA-C  Baclofen 5 MG TABS Take 1 tablet (5 mg total) by mouth 2 (two) times daily as needed. 10/07/23  Yes Densel Kronick, Lurena Joiner, PA-C  benazepril (LOTENSIN) 20 MG tablet Take 1 tablet (20 mg total) by mouth daily. For your blood pressure. 10/06/22  Yes Breeback, Jade L, PA-C  celecoxib (CELEBREX) 200 MG capsule TAKE 1 CAPSULE BY MOUTH TWICE A DAY for your joint pain. 01/06/23  Yes Breeback, Jade L, PA-C  cetirizine (ZYRTEC) 10 MG tablet Take 1 tablet (10 mg total) by mouth daily. 08/12/23  Yes Tamera Punt, Erika S, DO  chlorpheniramine-HYDROcodone (TUSSIONEX) 10-8 MG/5ML Take 5 mLs by mouth every 12 (twelve) hours as needed for cough (cough, will cause drowsiness.). 08/12/23  Yes Morey Hummingbird S, DO  cholecalciferol (VITAMIN D3) 25 MCG (1000 UNIT)  tablet Take 1,000 Units by mouth in the morning and at bedtime.   Yes [provider]  doxazosin (CARDURA) 4 MG tablet TAKE 1 TABLET BY MOUTH TWICE A DAY 04/08/23  Yes Breeback, Jade L, PA-C  DULoxetine (CYMBALTA) 20 MG capsule Take 1 capsule (20 mg total) by mouth daily. 02/04/23  Yes Breeback, Jade L, PA-C  fluticasone (FLONASE) 50 MCG/ACT nasal spray Place 2 sprays into both nostrils daily. 08/05/22  Yes Breeback, Jade L, PA-C  gabapentin (NEURONTIN) 300 MG capsule Take 1 capsule (300 mg total) by mouth 3 (three) times daily. 08/05/22  Yes Breeback, Jade L, PA-C  hydrOXYzine (ATARAX) 10 MG tablet TAKE 1 TABLET BY MOUTH THREE TIMES A DAY AS NEEDED 06/29/23  Yes Breeback, Jade L, PA-C  lamoTRIgine (LAMICTAL) 25 MG tablet Take 25 mg by mouth daily. 02/19/23  Yes [provider]  lithium carbonate (ESKALITH) 450 MG CR tablet Take 450 mg by mouth daily.   Yes [provider]  mupirocin ointment (BACTROBAN) 2 % Apply to affected area TID for 5 days. 03/06/23  Yes Breeback, Jade L, PA-C  Omega-3 Fatty Acids (FISH OIL) 1000 MG CAPS Take 1,000 mg by mouth daily.   Yes [provider]  predniSONE (DELTASONE) 20 MG tablet Take 2 tablets (40 mg total) by mouth daily with breakfast for 5 days. 10/07/23 10/12/23 Yes Minola Guin, Lurena Joiner, PA-C  sertraline (ZOLOFT) 100 MG tablet Take 100 mg by mouth daily.   Yes [provider]  tamsulosin (FLOMAX) 0.4 MG CAPS capsule TAKE 1 CAPSULE (0.4 MG TOTAL) BY MOUTH DAILY AFTER SUPPER. FOR PROSTATE AND URINARY SYMPTOMS. 06/29/23  Yes Breeback, Lonna Cobb, PA-C    Family History Family History  Problem Relation Age of Onset   Hypertension Sister    Hypertension Brother    CAD Neg Hx    Diabetes Neg Hx    Heart disease Neg Hx    Cancer Neg Hx     Social History Social History   Tobacco Use   Smoking status: Former    Current packs/day: 0.00    Types: Cigarettes    Quit date: 10/01/1983    Years since quitting: 40.0   Smokeless tobacco:  Never  Vaping Use   Vaping status: Never Used  Substance Use Topics   Alcohol use: Yes    Alcohol/week: 2.0 - 4.0 standard drinks of alcohol    Types: 2 - 4 Cans of beer per week    Comment: daily   Drug use: No     Allergies   Effexor [venlafaxine]   Review of Systems Review of Systems Per HPI  Physical Exam Triage Vital Signs ED Triage Vitals  Encounter Vitals Group  BP      Systolic BP Percentile      Diastolic BP Percentile      Pulse      Resp      Temp      Temp src      SpO2      Weight      Height      Head Circumference      Peak Flow      Pain Score      Pain Loc      Pain Education      Exclude from Growth Chart    No data found.  Updated Vital Signs BP (!) 166/70 (BP Location: Right Arm)   Pulse (!) 51   Temp 97.6 F (36.4 C) (Oral)   Resp 18   Ht 5\' 10"  (1.778 m)   Wt 200 lb (90.7 kg)   SpO2 98%   BMI 28.70 kg/m   Physical Exam Vitals and nursing note reviewed.  Constitutional:      General: He is not in acute distress. HENT:     Mouth/Throat:     Pharynx: Oropharynx is clear.  Cardiovascular:     Rate and Rhythm: Normal rate and regular rhythm.     Pulses: Normal pulses.  Pulmonary:     Effort: Pulmonary effort is normal.  Musculoskeletal:       Legs:     Comments: Tight muscles are palpated in area of left quadriceps. There is no bony tenderness at hip or knee. Full ROM without pain when sitting, pain with weight bearing. Distal sensation intact. Strong DP pulse. Strength 5/5 bilat lower extrem. No back pain - bony tenderness or muscle tenderness    Skin:    General: Skin is warm and dry.     Capillary Refill: Capillary refill takes less than 2 seconds.  Neurological:     Mental Status: He is alert and oriented to person, place, and time.     Gait: Gait abnormal (antalgic).     UC Treatments / Results  Labs (all labs ordered are listed, but only abnormal results are displayed) Labs Reviewed - No data to  display  EKG  Radiology No results found.  Procedures Procedures   Medications Ordered in UC Medications - No data to display  Initial Impression / Assessment and Plan / UC Course  I have reviewed the triage vital signs and the nursing notes.  Pertinent labs & imaging results that were available during my care of the patient were reviewed by me and considered in my medical decision making (see chart for details).  Patient suspects muscle, and I would agree. Defer xray imaging at this time; no bony tenderness and no injury/trauma. He does have arthritis in the hip which could be contributing. Treat with prednisone burst, muscle relaxer baclofen with drowsy precautions, and hot pad. Advised monitor symptoms. He does have orthopedics he can follow with if persisting pain. No red flags at this time. Patient agrees to plan He is hypertensive 166/70, runs around the same at his primary visits. Likely pain contributing as well. Continue amlodipine.   Final Clinical Impressions(s) / UC Diagnoses   Final diagnoses:  Left leg pain  Muscle strain     Discharge Instructions      Baclofen (muscle relaxer) -- 1 tablet twice daily. If the medication makes you drowsy, take only at bed time. Be cautious!  Prednisone (steroid) -- 2 tablets daily for 5 days in a row This is  to reduce pain and inflammation  Apply hot pad to the thigh to reduce muscle tightness  If symptoms persist, please contact your orthopedic specialist for follow up     ED Prescriptions     Medication Sig Dispense Auth. Provider   predniSONE (DELTASONE) 20 MG tablet Take 2 tablets (40 mg total) by mouth daily with breakfast for 5 days. 10 tablet Samik Balkcom, PA-C   Baclofen 5 MG TABS Take 1 tablet (5 mg total) by mouth 2 (two) times daily as needed. 20 tablet Breken Nazari, Lurena Joiner, PA-C      PDMP not reviewed this encounter.   Marlow Baars, [place] 10/07/23 1447

## 2023-10-07 NOTE — Telephone Encounter (Signed)
 1st attempt to call back patient, phone rang then message "we're sorry your call cannot be completed at this time please hang up and try again later" . Noted in chart patient is checked in at urgent care as of 1325.  Copied from CRM [id number]. Topic: Clinical - Red Word Triage >> Oct 07, 2023  1:19 PM [name] wrote: [facility] that prompted transfer to Nurse Triage:  FALL- painful Leg injury. Pt cannot walk. Call got disconnected before transfer.  Best call back [phone]

## 2023-10-07 NOTE — Discharge Instructions (Addendum)
 Baclofen (muscle relaxer) -- 1 tablet twice daily. If the medication makes you drowsy, take only at bed time. Be cautious!  Prednisone (steroid) -- 2 tablets daily for 5 days in a row This is to reduce pain and inflammation  Apply hot pad to the thigh to reduce muscle tightness  If symptoms persist, please contact your orthopedic specialist for follow up

## 2023-10-07 NOTE — ED Triage Notes (Signed)
 Patient c/o left leg thigh pain x 3 days, no injury.  Pain radiates from knee up thigh area.  Patient has taken Advil.

## 2023-10-07 NOTE — Telephone Encounter (Signed)
 This RN made second attempt to contact pt but noticed he is currently at ED.

## 2023-10-14 ENCOUNTER — Ambulatory Visit (INDEPENDENT_AMBULATORY_CARE_PROVIDER_SITE_OTHER): Admitting: Physician Assistant

## 2023-10-14 ENCOUNTER — Encounter: Payer: Self-pay | Admitting: Physician Assistant

## 2023-10-14 ENCOUNTER — Ambulatory Visit

## 2023-10-14 VITALS — BP 128/103 | HR 61 | Ht 70.0 in | Wt 195.5 lb

## 2023-10-14 DIAGNOSIS — R221 Localized swelling, mass and lump, neck: Secondary | ICD-10-CM

## 2023-10-14 DIAGNOSIS — M79605 Pain in left leg: Secondary | ICD-10-CM

## 2023-10-14 DIAGNOSIS — R1312 Dysphagia, oropharyngeal phase: Secondary | ICD-10-CM | POA: Diagnosis not present

## 2023-10-14 DIAGNOSIS — M25552 Pain in left hip: Secondary | ICD-10-CM | POA: Diagnosis not present

## 2023-10-14 DIAGNOSIS — M25852 Other specified joint disorders, left hip: Secondary | ICD-10-CM

## 2023-10-14 DIAGNOSIS — I878 Other specified disorders of veins: Secondary | ICD-10-CM | POA: Diagnosis not present

## 2023-10-14 DIAGNOSIS — R1310 Dysphagia, unspecified: Secondary | ICD-10-CM | POA: Diagnosis not present

## 2023-10-14 DIAGNOSIS — M25859 Other specified joint disorders, unspecified hip: Secondary | ICD-10-CM

## 2023-10-14 DIAGNOSIS — M1612 Unilateral primary osteoarthritis, left hip: Secondary | ICD-10-CM | POA: Diagnosis not present

## 2023-10-14 DIAGNOSIS — M79652 Pain in left thigh: Secondary | ICD-10-CM | POA: Diagnosis not present

## 2023-10-14 MED ORDER — HYDROCODONE-ACETAMINOPHEN 5-325 MG PO TABS: 1.0000 | ORAL_TABLET | Freq: Four times a day (QID) | ORAL | 0 refills | Status: AC | PRN

## 2023-10-14 NOTE — Patient Instructions (Signed)
 Get xrays today Norco for pain as needed, continue celebrex  Hip Pain The hip is the joint between the upper legs and the lower pelvis. The bones, cartilage, tendons, and muscles of your hip joint support your body and allow you to move around. Hip pain can range from a minor ache to severe pain in one or both of your hips. The pain may be felt on the inside of the hip joint near the groin, or on the outside near the buttocks and upper thigh. You may also have swelling or stiffness in your hip area. Follow these instructions at home: Managing pain, stiffness, and swelling     If told, put ice on the painful area. Put ice in a plastic bag. Place a towel between your skin and the bag. Leave the ice on for 20 minutes, 2-3 times a day. If told, apply heat to the affected area as often as told by your health care provider. Use the heat source that your provider recommends, such as a moist heat pack or a heating pad. Place a towel between your skin and the heat source. Leave the heat on for 20-30 minutes. If your skin turns bright red, remove the ice or heat right away to prevent skin damage. The risk of damage is higher if you cannot feel pain, heat, or cold. Activity Do exercises as told by your provider. Avoid activities that cause pain. General instructions  Take over-the-counter and prescription medicines only as told by your provider. Keep a journal of your symptoms. Write down: How often you have hip pain. The location of your pain. What the pain feels like. What makes the pain worse. Sleep with a pillow between your legs on your most comfortable side. Keep all follow-up visits. Your provider will monitor your pain and activity. Contact a health care provider if: You cannot put weight on your leg. Your pain or swelling gets worse after a week. It gets harder to walk. You have a fever. Get help right away if: You fall. You have a sudden increase in pain and swelling in your  hip. Your hip is red or swollen or very tender to touch. This information is not intended to replace advice given to you by your health care provider. Make sure you discuss any questions you have with your health care provider. Document Revised: 03/25/2022 Document Reviewed: 03/25/2022 Elsevier Patient Education  2024 [employer].

## 2023-10-14 NOTE — Progress Notes (Signed)
 Established Patient Office Visit  Subjective   Patient ID: Alexander [name], male    DOB: 1951/05/17  Age: 73 y.o. MRN: [id number]  Chief Complaint  Patient presents with   Leg Pain    Left x2 wks    HPI Pt is a 73 yo male with left thigh and hip pain for the last 1.5 weeks that is not improving. He does not remember any injury. He rates it a 8/10. He has to walk with cane. He has a lot of problems putting any weight on it and especially standing up, going up stairs and getting in and out of cars. He is usually very active. He has had no falls. He went to UC on 3/5. He was given prednisone and baclofen.  He took prednisone and did not seem to help.   He has also noticed problems swallowing for last few weeks. He is getting chocked on foods and feels a fullness in his neck.   .. Active Ambulatory Problems    Diagnosis Date Noted   Essential hypertension, benign 09/30/2013   Barrett's esophagus 09/30/2013   Impaired fasting glucose 09/30/2013   BPH (benign prostatic hyperplasia) 09/30/2013   Bipolar 1 disorder, mixed (HCC) 09/30/2013   Generalized anxiety disorder 09/30/2013   Hyperlipidemia 10/03/2013   History of diverticulitis 10/20/2013   Preventive measure 10/20/2013   OSA on CPAP 12/28/2013   Osteoarthritis of carpometacarpal joint of left thumb 01/05/2015   Ear mass, left 06/06/2016   Chronic pain of right knee 03/25/2017   Seborrheic keratoses 03/25/2017   RLS (restless legs syndrome) 03/25/2017   Diarrhea 03/25/2017   Hematoma 04/24/2017   Lipoma of neck 04/24/2017   Acute non-recurrent pansinusitis 08/12/2017   Memory changes 08/30/2017   Elevated fasting glucose 08/30/2017   Reactive airway disease without asthma 09/13/2017   Bilateral lower extremity edema 09/13/2017   Low libido 10/08/2017   Irritable bowel syndrome with diarrhea 10/08/2017   Irregular heart beat 01/27/2018   LVH (left ventricular hypertrophy) 01/27/2018   Neck pain 01/27/2018   Serum  calcium elevated 01/27/2018   Loose stools 01/27/2018   DDD (degenerative disc disease), cervical 01/29/2018   Accessory skin tags 04/28/2018   Erectile dysfunction 07/15/2018   Cervical spondylosis 09/15/2018   Chronic pain disorder 11/01/2018   NSAID induced gastritis 01/20/2019   Sinus bradycardia 04/04/2019   Herniation of cervical intervertebral disc with radiculopathy 04/19/2019   Fusion of spine of cervical region 04/19/2019   PVC (premature ventricular contraction) 06/15/2019   Elevated PTHrP level 12/30/2019   Elevated calcitonin level 12/30/2019   Essential tremor 12/30/2019   Arthralgia of multiple joints 12/30/2019   Hyperparathyroidism due to lithium therapy (HCC) 03/30/2020   Primary osteoarthritis of left knee 04/01/2021   Lumbar degenerative disc disease 04/09/2021   Bilateral primary osteoarthritis of hip 10/03/2021   Nausea 10/25/2021   Hoarseness 10/25/2021   Prolonged Q-T interval on ECG 11/22/2021   Labile blood pressure 11/22/2021   Taste impairment 11/22/2021   Palpitations 11/22/2021   Dizziness 11/22/2021   Diverticulitis 2007   GERD (gastroesophageal reflux disease) 12/24/2021   Head injury 06/24/2022   Fall 06/24/2022   Worsening headaches 06/24/2022   Vision changes 06/24/2022   Blood in semen 06/24/2022   Chronic right maxillary sinusitis 08/13/2022   Pruritus 10/13/2022   Toenail fungus 01/06/2023   Bilateral carpal tunnel syndrome 01/16/2023   Itching 01/16/2023   Low blood pressure reading 03/06/2023   Sinobronchitis 08/12/2023   Seasonal allergies 08/12/2023  Left hip pain 10/14/2023   Left leg pain 10/14/2023   Oropharyngeal dysphagia 10/14/2023   Neck mass 10/14/2023   Femoral acetabular impingement 10/16/2023   Resolved Ambulatory Problems    Diagnosis Date Noted   Need for influenza vaccination 03/25/2017   Epidermal cyst 03/25/2017   Excessive bleeding 04/24/2017   Cough 08/30/2017   Vision changes 10/08/2017   Elevated  bilirubin 01/27/2018   Osteoarthritis of facet joint of cervical spine 04/26/2018   Cervical radiculopathy 11/01/2018   Spinal stenosis of cervical region 11/05/2018   Other spondylosis with radiculopathy, cervical region 04/19/2019   Status post cervical spinal fusion 03/30/2020   Chronic pain of left knee 01/16/2023   Past Medical History:  Diagnosis Date   Pounding heartbeat 11/22/2021   Reactive airway disease that is not asthma 09/13/2017     ROS See HPI.    Objective:     BP 122/88   Pulse 61   Ht 5\' 10"  (1.778 m)   Wt 195 lb 8 oz (88.7 kg)   SpO2 97%   BMI 28.05 kg/m  BP Readings from Last 3 Encounters:  10/14/23 122/88  10/07/23 (!) 166/70  08/12/23 (!) 158/64   Wt Readings from Last 3 Encounters:  10/14/23 195 lb 8 oz (88.7 kg)  10/07/23 200 lb (90.7 kg)  08/12/23 204 lb 8 oz (92.8 kg)      Physical Exam Constitutional:      Appearance: Normal appearance.  HENT:     Head: Normocephalic.     Mouth/Throat:     Mouth: Mucous membranes are moist.  Neck:     Comments: Firm mass of anterior cervical chain around larynx with no tenderness.  Cardiovascular:     Rate and Rhythm: Normal rate and regular rhythm.  Pulmonary:     Effort: Pulmonary effort is normal.  Musculoskeletal:     Cervical back: Normal range of motion. No tenderness.     Right lower leg: No edema.     Left lower leg: No edema.     Comments: No tenderness over left greater trochanter 5/5 left leg strength ROM decreased externally due to pain Not able to bear weight on left leg  Neurological:     General: No focal deficit present.     Mental Status: He is alert and oriented to person, place, and time.  Psychiatric:        Mood and Affect: Mood normal.          The 10-year ASCVD risk score (Arnett DK, et al., 2019) is: 20.6%    Assessment & Plan:  [name] KitchenMarland KitchenDorinda Hill L was seen today for leg pain.  Diagnoses and all orders for this visit:  Left hip pain -     DG Hip Unilat W OR W/O  Pelvis 2-3 Views Left; Future -     HYDROcodone-acetaminophen (NORCO/VICODIN) 5-325 MG tablet; Take 1 tablet by mouth every 6 (six) hours as needed for up to 5 days for moderate pain (pain score 4-6). -     Ambulatory referral to Physical Therapy  Left leg pain -     DG Hip Unilat W OR W/O Pelvis 2-3 Views Left; Future -     HYDROcodone-acetaminophen (NORCO/VICODIN) 5-325 MG tablet; Take 1 tablet by mouth every 6 (six) hours as needed for up to 5 days for moderate pain (pain score 4-6). -     Ambulatory referral to Physical Therapy  Neck mass -     US Soft Tissue Head/Neck (NON-THYROID); Future -  Lithium level -     CMP14+EGFR -     TSH + free T4 -     CBC w/Diff/Platelet  Oropharyngeal dysphagia -     US Soft Tissue Head/Neck (NON-THYROID); Future -     Lithium level -     CMP14+EGFR -     TSH + free T4 -     CBC w/Diff/Platelet  Femoral acetabular impingement -     Ambulatory referral to Physical Therapy   Will get u/s of neck to evaluate mass and dysphagia Labs for medication management Follow up with dysphagia to dedicated appt  Xray of left hip ordered today:  Personally reviewed and confirm left hip arthritis and findings of femoral acetabular impingement Will order PT and get in with Dr. Karie Schwalbe in 2 weeks Continue celebrex daily Continue baclofen as needed Tramadol interferes with lithium Norco given for PRN use for pain .PDMP reviewed during this encounter. No concerns  Tandy Gaw, PA-C

## 2023-10-15 LAB — CBC WITH DIFFERENTIAL/PLATELET
Basophils Absolute: 0.1 10*3/uL (ref 0.0–0.2)
Basos: 1 %
EOS (ABSOLUTE): 0.3 10*3/uL (ref 0.0–0.4)
Eos: 4 %
Hematocrit: 39.8 % (ref 37.5–51.0)
Hemoglobin: 13.1 g/dL (ref 13.0–17.7)
Immature Grans (Abs): 0 10*3/uL (ref 0.0–0.1)
Immature Granulocytes: 0 %
Lymphocytes Absolute: 1.7 10*3/uL (ref 0.7–3.1)
Lymphs: 21 %
MCH: 31.3 pg (ref 26.6–33.0)
MCHC: 32.9 g/dL (ref 31.5–35.7)
MCV: 95 fL (ref 79–97)
Monocytes Absolute: 0.6 10*3/uL (ref 0.1–0.9)
Monocytes: 7 %
Neutrophils Absolute: 5.6 10*3/uL (ref 1.4–7.0)
Neutrophils: 67 %
Platelets: 200 10*3/uL (ref 150–450)
RBC: 4.19 x10E6/uL (ref 4.14–5.80)
RDW: 13.3 % (ref 11.6–15.4)
WBC: 8.2 10*3/uL (ref 3.4–10.8)

## 2023-10-15 LAB — TSH+FREE T4
Free T4: 1.12 ng/dL (ref 0.82–1.77)
TSH: 1.62 u[IU]/mL (ref 0.450–4.500)

## 2023-10-15 LAB — CMP14+EGFR
ALT: 23 IU/L (ref 0–44)
AST: 24 IU/L (ref 0–40)
Albumin: 4.2 g/dL (ref 3.8–4.8)
Alkaline Phosphatase: 102 IU/L (ref 44–121)
BUN/Creatinine Ratio: 21 (ref 10–24)
BUN: 20 mg/dL (ref 8–27)
Bilirubin Total: 0.8 mg/dL (ref 0.0–1.2)
CO2: 21 mmol/L (ref 20–29)
Calcium: 10.9 mg/dL — ABNORMAL HIGH (ref 8.6–10.2)
Chloride: 107 mmol/L — ABNORMAL HIGH (ref 96–106)
Creatinine, Ser: 0.94 mg/dL (ref 0.76–1.27)
Globulin, Total: 2 g/dL (ref 1.5–4.5)
Glucose: 103 mg/dL — ABNORMAL HIGH (ref 70–99)
Potassium: 4.6 mmol/L (ref 3.5–5.2)
Sodium: 142 mmol/L (ref 134–144)
Total Protein: 6.2 g/dL (ref 6.0–8.5)
eGFR: 86 mL/min/{1.73_m2} (ref >59)

## 2023-10-15 LAB — LITHIUM LEVEL: Lithium Lvl: 0.4 mmol/L — ABNORMAL LOW (ref 0.5–1.2)

## 2023-10-16 ENCOUNTER — Encounter: Payer: Self-pay | Admitting: Physician Assistant

## 2023-10-16 DIAGNOSIS — M25859 Other specified joint disorders, unspecified hip: Secondary | ICD-10-CM | POA: Insufficient documentation

## 2023-10-16 NOTE — Progress Notes (Signed)
 Alexander [name],   You do have some impingement seen in the left hip. I placed order for formal physical therapy and you need to follow up with Dr. Karie Schwalbe in 2 weeks.

## 2023-10-16 NOTE — Progress Notes (Signed)
 Lithium level is low. Need to let [facility] know this and may want to adjust medication.   Thyroid looks good. Kidney and liver look great.

## 2023-10-17 DIAGNOSIS — G4733 Obstructive sleep apnea (adult) (pediatric): Secondary | ICD-10-CM | POA: Diagnosis not present

## 2023-10-28 ENCOUNTER — Other Ambulatory Visit: Payer: Self-pay | Admitting: Physician Assistant

## 2023-10-28 DIAGNOSIS — I10 Essential (primary) hypertension: Secondary | ICD-10-CM

## 2023-11-04 ENCOUNTER — Encounter: Payer: Self-pay | Admitting: Physician Assistant

## 2023-11-04 NOTE — Progress Notes (Signed)
 No abnormality identified in the region of the neck. Great news.

## 2023-11-22 ENCOUNTER — Other Ambulatory Visit: Payer: Self-pay | Admitting: Physician Assistant

## 2023-11-22 DIAGNOSIS — I10 Essential (primary) hypertension: Secondary | ICD-10-CM

## 2023-12-30 ENCOUNTER — Other Ambulatory Visit: Payer: Self-pay | Admitting: Physician Assistant

## 2023-12-30 DIAGNOSIS — L299 Pruritus, unspecified: Secondary | ICD-10-CM

## 2024-01-03 ENCOUNTER — Other Ambulatory Visit: Payer: Self-pay | Admitting: Physician Assistant

## 2024-01-03 DIAGNOSIS — E782 Mixed hyperlipidemia: Secondary | ICD-10-CM

## 2024-01-19 ENCOUNTER — Other Ambulatory Visit: Payer: Self-pay | Admitting: Physician Assistant

## 2024-01-19 DIAGNOSIS — G8929 Other chronic pain: Secondary | ICD-10-CM

## 2024-01-19 NOTE — Telephone Encounter (Signed)
 Please advise on refill request

## 2024-03-23 ENCOUNTER — Encounter: Payer: Self-pay | Admitting: Physician Assistant

## 2024-03-23 ENCOUNTER — Ambulatory Visit (INDEPENDENT_AMBULATORY_CARE_PROVIDER_SITE_OTHER): Admitting: Physician Assistant

## 2024-03-23 VITALS — BP 153/43 | HR 60 | Ht 70.0 in | Wt 204.0 lb

## 2024-03-23 DIAGNOSIS — E782 Mixed hyperlipidemia: Secondary | ICD-10-CM | POA: Diagnosis not present

## 2024-03-23 DIAGNOSIS — L2489 Irritant contact dermatitis due to other agents: Secondary | ICD-10-CM | POA: Diagnosis not present

## 2024-03-23 DIAGNOSIS — S0085XA Superficial foreign body of other part of head, initial encounter: Secondary | ICD-10-CM

## 2024-03-23 DIAGNOSIS — G4733 Obstructive sleep apnea (adult) (pediatric): Secondary | ICD-10-CM | POA: Diagnosis not present

## 2024-03-23 DIAGNOSIS — D582 Other hemoglobinopathies: Secondary | ICD-10-CM

## 2024-03-23 DIAGNOSIS — D649 Anemia, unspecified: Secondary | ICD-10-CM | POA: Diagnosis not present

## 2024-03-23 DIAGNOSIS — N400 Enlarged prostate without lower urinary tract symptoms: Secondary | ICD-10-CM

## 2024-03-23 DIAGNOSIS — I10 Essential (primary) hypertension: Secondary | ICD-10-CM

## 2024-03-23 DIAGNOSIS — R239 Unspecified skin changes: Secondary | ICD-10-CM | POA: Insufficient documentation

## 2024-03-23 DIAGNOSIS — B351 Tinea unguium: Secondary | ICD-10-CM | POA: Diagnosis not present

## 2024-03-23 DIAGNOSIS — Z79899 Other long term (current) drug therapy: Secondary | ICD-10-CM

## 2024-03-23 DIAGNOSIS — R238 Other skin changes: Secondary | ICD-10-CM

## 2024-03-23 MED ORDER — CICLOPIROX 8 % EX SOLN: Freq: Every day | CUTANEOUS | 1 refills | Status: DC

## 2024-03-23 NOTE — Patient Instructions (Signed)
 Address Inflammation Over-the-counter hydrocortisone cream or an anti-itch lotion may help reduce redness and itching, but use these with caution and avoid applying directly where the mask sits unless advised by your doctor.  Use a Mask Liner  CPAP mask liners help reduce skin irritation by acting as a soft barrier between the mask and your skin. These liners are made from material designed to be gentle on the skin, such as cotton or moisture-wicking fabrics. Liners can also help reduce moisture.  Moisturize Before You Use Your CPAP  If you have dry skin, that could make you more likely to experience contact dermatitis from a CPAP mask. Putting on facial moisturizer, or applying a thicker cream to the areas of your face that the mask touches before going to sleep, may help.

## 2024-03-23 NOTE — Progress Notes (Unsigned)
 Established Patient Office Visit  Subjective   Patient ID: Alexander [name], male    DOB: 1950-09-06  Age: 73 y.o. MRN: [id number]  Chief Complaint  Patient presents with   Medical Management of Chronic Issues    HPI Pt is a 73 yo male who presents to the clinic with multiple concerns today.   He feels like he has glass or steel in his face on the left side. 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 This has been bothering him for months. He wants it out. He has tried to pick it out but not working.   He would like referral to podiatry. He is toenail fungus that is just not going away. Failed lamisil  and penlac .   Pt is getting a rash every night and morning from using his CPaP mask. He has called the company and they are sending a different mask. He wonders if there is anything else he can do.    ROS See HPI.    Objective:     BP (!) 153/43   Pulse 60   Ht 5' 10 (1.778 m)   Wt 204 lb (92.5 kg)   SpO2 99%   BMI 29.27 kg/m  BP Readings from Last 3 Encounters:  03/23/24 (!) 153/43  10/14/23 122/88  10/07/23 (!) 166/70   Wt Readings from Last 3 Encounters:  03/23/24 204 lb (92.5 kg)  10/14/23 195 lb 8 oz (88.7 kg)  10/07/23 200 lb (90.7 kg)   Incision and Drainage Procedure Note  Pre-operative Diagnosis: left cheek foreign body  Post-operative Diagnosis: left cheek foreign body appeared like glass/steel  Indications: pain  Anesthesia: not needed  Procedure Details  The procedure, risks and complications have been discussed in detail (including, but not limited to airway compromise, infection, bleeding) with the patient, and the patient has signed consent to the procedure.  The skin was sterilely prepped and draped over the affected area in the usual fashion. Pt declined anethesia, I&D with a #11 blade was performed on the left cheek. Purulent drainage: absent. Small foreign body that appeared like glass or steel removed.  The patient was observed until  stable.  Findings: Glass or steel in left cheek  EBL: scant  Drains: none  Condition: Tolerated procedure well   Complications: none.      Physical Exam Constitutional:      Appearance: Normal appearance. He is obese.  HENT:     Head: Normocephalic.  Cardiovascular:     Rate and Rhythm: Normal rate and regular rhythm.  Pulmonary:     Effort: Pulmonary effort is normal.     Breath sounds: Normal breath sounds.  Skin:    Comments: Multiple toenails thick, yellow, dystrophic on bilateral feet.   Erythematous area of left cheek with a small lump noted to palpation.   Neurological:     General: No focal deficit present.     Mental Status: He is alert and oriented to person, place, and time.  Psychiatric:        Mood and Affect: Mood normal.       The ASCVD Risk score (Arnett DK, et al., 2019) failed to calculate for the following reasons:   The valid total cholesterol range is 130 to 320 mg/dL    Assessment & Plan:  SABRASABRALacey [name] was seen today for medical management of chronic issues.  Diagnoses and all orders for this visit:  Foreign body in skin of cheek  Skin irritation  Onychomycosis -  Ambulatory referral to Podiatry -     ciclopirox  (PENLAC ) 8 % solution; Apply topically at bedtime. Apply over nail and surrounding skin. Apply daily over previous coat. After seven (7) days, may remove with alcohol  and continue cycle.  Essential hypertension, benign -     CMP14+EGFR  OSA on CPAP  Mixed hyperlipidemia -     Lipid panel  Medication management -     Lithium  level -     CMP14+EGFR -     CBC w/Diff/Platelet -     Lipid panel -     PSA  Benign prostatic hyperplasia without lower urinary tract symptoms -     PSA  Irritant contact dermatitis due to other agents  Elevated hemoglobin (HCC) -     Iron -     Ferritin -     Specimen status report   Labs ordered.  Foreign body removed from left cheek Pt feels better Follow up as  needed    Return in about 6 months (around 09/23/2024), or if symptoms worsen or fail to improve.    Alexander Arocho, PA-C

## 2024-03-24 LAB — CMP14+EGFR
ALT: 25 IU/L (ref 0–44)
AST: 23 IU/L (ref 0–40)
Albumin: 4.3 g/dL (ref 3.8–4.8)
Alkaline Phosphatase: 121 IU/L (ref 44–121)
BUN/Creatinine Ratio: 30 — ABNORMAL HIGH (ref 10–24)
BUN: 32 mg/dL — ABNORMAL HIGH (ref 8–27)
Bilirubin Total: 0.6 mg/dL (ref 0.0–1.2)
CO2: 20 mmol/L (ref 20–29)
Calcium: 11 mg/dL — ABNORMAL HIGH (ref 8.6–10.2)
Chloride: 106 mmol/L (ref 96–106)
Creatinine, Ser: 1.05 mg/dL (ref 0.76–1.27)
Globulin, Total: 2 g/dL (ref 1.5–4.5)
Glucose: 109 mg/dL — ABNORMAL HIGH (ref 70–99)
Potassium: 4.7 mmol/L (ref 3.5–5.2)
Sodium: 138 mmol/L (ref 134–144)
Total Protein: 6.3 g/dL (ref 6.0–8.5)
eGFR: 74 mL/min/1.73 (ref >59)

## 2024-03-24 LAB — CBC WITH DIFFERENTIAL/PLATELET
Basophils Absolute: 0.1 x10E3/uL (ref 0.0–0.2)
Basos: 1 %
EOS (ABSOLUTE): 0.5 x10E3/uL — ABNORMAL HIGH (ref 0.0–0.4)
Eos: 6 %
Hematocrit: 35.4 % — ABNORMAL LOW (ref 37.5–51.0)
Hemoglobin: 11.8 g/dL — ABNORMAL LOW (ref 13.0–17.7)
Immature Grans (Abs): 0 x10E3/uL (ref 0.0–0.1)
Immature Granulocytes: 0 %
Lymphocytes Absolute: 1.8 x10E3/uL (ref 0.7–3.1)
Lymphs: 22 %
MCH: 32.7 pg (ref 26.6–33.0)
MCHC: 33.3 g/dL (ref 31.5–35.7)
MCV: 98 fL — ABNORMAL HIGH (ref 79–97)
Monocytes Absolute: 0.6 x10E3/uL (ref 0.1–0.9)
Monocytes: 7 %
Neutrophils Absolute: 5 x10E3/uL (ref 1.4–7.0)
Neutrophils: 64 %
Platelets: 194 x10E3/uL (ref 150–450)
RBC: 3.61 x10E6/uL — ABNORMAL LOW (ref 4.14–5.80)
RDW: 13.3 % (ref 11.6–15.4)
WBC: 8 x10E3/uL (ref 3.4–10.8)

## 2024-03-24 LAB — LIPID PANEL
Chol/HDL Ratio: 3 ratio (ref 0.0–5.0)
Cholesterol, Total: 127 mg/dL (ref 100–199)
HDL: 43 mg/dL (ref >39)
LDL Chol Calc (NIH): 43 mg/dL (ref 0–99)
Triglycerides: 265 mg/dL — ABNORMAL HIGH (ref 0–149)
VLDL Cholesterol Cal: 41 mg/dL — ABNORMAL HIGH (ref 5–40)

## 2024-03-24 LAB — LITHIUM LEVEL: Lithium Lvl: 0.6 mmol/L (ref 0.5–1.2)

## 2024-03-24 LAB — PSA: Prostate Specific Ag, Serum: 2.8 ng/mL (ref 0.0–4.0)

## 2024-03-25 ENCOUNTER — Ambulatory Visit: Payer: Self-pay | Admitting: Physician Assistant

## 2024-03-25 NOTE — Progress Notes (Signed)
 Question not yet answered by patient if eating differently

## 2024-03-25 NOTE — Progress Notes (Signed)
 Donald,   Labs look good.   Are you eating differently? Your hemoglobin is low. Can we please add ferritin/serum iron.     Clinic staff: Will you please fax these labs to Dr. Karolynn.

## 2024-03-28 ENCOUNTER — Encounter: Payer: Self-pay | Admitting: Physician Assistant

## 2024-03-28 DIAGNOSIS — L2489 Irritant contact dermatitis due to other agents: Secondary | ICD-10-CM | POA: Insufficient documentation

## 2024-03-28 LAB — SPECIMEN STATUS REPORT

## 2024-03-28 LAB — FERRITIN: Ferritin: 353 ng/mL (ref 30–400)

## 2024-03-28 LAB — IRON: Iron: 108 ug/dL (ref 38–169)

## 2024-04-05 ENCOUNTER — Encounter: Payer: Self-pay | Admitting: Sports Medicine

## 2024-04-07 ENCOUNTER — Encounter: Payer: Self-pay | Admitting: Podiatry

## 2024-04-07 ENCOUNTER — Ambulatory Visit: Admitting: Podiatry

## 2024-04-07 DIAGNOSIS — B351 Tinea unguium: Secondary | ICD-10-CM | POA: Diagnosis not present

## 2024-04-07 NOTE — Progress Notes (Signed)
 Subjective:  Patient ID: Alexander [name], male    DOB: 1951-04-20,   MRN: [id number]  Chief Complaint  Patient presents with   Nail Problem    A long time ago, I took some medicine for my toenails.  I can't tell you what it was. My toenails are gradually getting worse.       73 y.o. male presents for concern as above. He has tried some medication years ago and didn't help. He does relate some damage tot he nails in the past.  . Denies any other pedal complaints. Denies n/v/f/c.   Past Medical History:  Diagnosis Date   Accessory skin tags 04/28/2018   Acute non-recurrent pansinusitis 08/12/2017   Arthralgia of multiple joints 12/30/2019   Barrett's esophagus 09/30/2013   Bilateral lower extremity edema 09/13/2017   Bilateral primary osteoarthritis of hip 10/03/2021   Bipolar 1 disorder, mixed (HCC) 09/30/2013   [facility], Dr. Karolynn    BPH (benign prostatic hyperplasia) 09/30/2013   Cervical spondylosis 09/15/2018   Chronic pain disorder 11/01/2018   Chronic pain of right knee 03/25/2017   DDD (degenerative disc disease), cervical 01/29/2018   Diarrhea 03/25/2017   Diverticulitis 2007   Dizziness 11/22/2021   Ear mass, left 06/06/2016   Biopsy- Actinic keratosis via biopsy.    Elevated calcitonin level 12/30/2019   Elevated fasting glucose 08/30/2017   Elevated PTHrP level 12/30/2019   Erectile dysfunction 07/15/2018   Essential hypertension, benign 09/30/2013   Essential tremor 12/30/2019   Fusion of spine of cervical region 04/19/2019   Generalized anxiety disorder 09/30/2013   GERD (gastroesophageal reflux disease)    Hematoma 04/24/2017   Herniation of cervical intervertebral disc with radiculopathy 04/19/2019   C3-4   History of diverticulitis 10/20/2013   With colonectomy    Hoarseness 10/25/2021   Hyperlipidemia 10/03/2013   AHA 10 year risk of 19.5% Feb 2015, encouraged to start Lipitor   Hyperparathyroidism due to lithium  therapy (HCC) 03/30/2020   Per endocrinology as long as  he has no sequela and calcium  is 1mg /dL from upper limits of normal ok to stay on lithium . No extra calcium .    Impaired fasting glucose 09/30/2013   Irregular heart beat 01/27/2018   Irritable bowel syndrome with diarrhea 10/08/2017   Labile blood pressure 11/22/2021   Lipoma of neck 04/24/2017   Loose stools 01/27/2018   Low libido 10/08/2017   Lumbar degenerative disc disease 04/09/2021   LVH (left ventricular hypertrophy) 01/27/2018   Memory changes 08/30/2017   Nausea 10/25/2021   Neck pain 01/27/2018   NSAID induced gastritis 01/20/2019   OSA on CPAP 12/28/2013   Osteoarthritis of carpometacarpal joint of left thumb 01/05/2015   Pounding heartbeat 11/22/2021   Preventive measure 10/20/2013   Colonoscopy & EGD 2014 [facility] - Repeat EGD 2017, Colon 2019   Primary osteoarthritis of left knee 04/01/2021   Prolonged Q-T interval on ECG 11/22/2021   PVC (premature ventricular contraction) 06/15/2019   Reactive airway disease that is not asthma 09/13/2017   RLS (restless legs syndrome) 03/25/2017   Seborrheic keratoses 03/25/2017   Serum calcium  elevated 01/27/2018   Sinus bradycardia 04/04/2019   Taste impairment 11/22/2021    Objective:  Physical Exam: Vascular: DP/PT pulses 2/4 bilateral. CFT <3 seconds. Normal hair growth on digits. No edema.  Skin. No lacerations or abrasions bilateral feet.  Bilateral hallux nails thickened and with subungual debris and discoloration on right distal medial quadrant and affect whole left hallux nail.  Musculoskeletal: MMT  5/5 bilateral lower extremities in DF, PF, Inversion and Eversion. Deceased ROM in DF of ankle joint.  Neurological: Sensation intact to light touch.   Assessment:   1. Onychomycosis      Plan:  Patient was evaluated and treated and all questions answered. -Examined patient -Discussed treatment options for painful dystrophic nails  -Clinical picture and Fungal culture was obtained by removing a portion of the hard nail itself from  each of the involved toenails using a sterile nail nipper and sent to [facility] lab. Patient tolerated the biopsy procedure well without discomfort or need for anesthesia.  -Discussed fungal nail treatment options including oral, topical, and laser treatments.  -Patient to return in 4 weeks for follow up evaluation and discussion of fungal culture results or sooner if symptoms worsen.   Asberry Failing, DPM

## 2024-04-07 NOTE — Addendum Note (Signed)
 Addended by: Maleyah Evans J on: 04/07/2024 04:22 PM   Modules accepted: Orders

## 2024-04-11 ENCOUNTER — Other Ambulatory Visit: Payer: Self-pay | Admitting: Physician Assistant

## 2024-04-11 DIAGNOSIS — I10 Essential (primary) hypertension: Secondary | ICD-10-CM

## 2024-04-14 ENCOUNTER — Other Ambulatory Visit: Payer: Self-pay | Admitting: Podiatry

## 2024-05-05 ENCOUNTER — Ambulatory Visit: Admitting: Podiatry

## 2024-05-05 ENCOUNTER — Ambulatory Visit

## 2024-05-05 DIAGNOSIS — M205X1 Other deformities of toe(s) (acquired), right foot: Secondary | ICD-10-CM

## 2024-05-05 DIAGNOSIS — B351 Tinea unguium: Secondary | ICD-10-CM

## 2024-05-05 DIAGNOSIS — M19071 Primary osteoarthritis, right ankle and foot: Secondary | ICD-10-CM | POA: Diagnosis not present

## 2024-05-05 MED ORDER — CICLOPIROX 8 % EX SOLN: Freq: Every day | CUTANEOUS | 0 refills | Status: DC

## 2024-05-05 NOTE — Progress Notes (Signed)
 Subjective:  Patient ID: Alexander [name], male    DOB: [birth year],   MRN: [id number]  Chief Complaint  Patient presents with   Nail Problem    4 week check Nail fungus. Feel like he is going to lose L great toe nail nails have some soreness.  Not diabetic.    73 y.o. male presents for  follow-up of fungal nails and to discuss culture results.  . Denies any other pedal complaints. Denies n/v/f/c.   Past Medical History:  Diagnosis Date   Accessory skin tags 04/28/2018   Acute non-recurrent pansinusitis 08/12/2017   Arthralgia of multiple joints 12/30/2019   Barrett's esophagus 09/30/2013   Bilateral lower extremity edema 09/13/2017   Bilateral primary osteoarthritis of hip 10/03/2021   Bipolar 1 disorder, mixed (HCC) 09/30/2013   [facility], Dr. Karolynn    BPH (benign prostatic hyperplasia) 09/30/2013   Cervical spondylosis 09/15/2018   Chronic pain disorder 11/01/2018   Chronic pain of right knee 03/25/2017   DDD (degenerative disc disease), cervical 01/29/2018   Diarrhea 03/25/2017   Diverticulitis 2007   Dizziness 11/22/2021   Ear mass, left 06/06/2016   Biopsy- Actinic keratosis via biopsy.    Elevated calcitonin level 12/30/2019   Elevated fasting glucose 08/30/2017   Elevated PTHrP level 12/30/2019   Erectile dysfunction 07/15/2018   Essential hypertension, benign 09/30/2013   Essential tremor 12/30/2019   Fusion of spine of cervical region 04/19/2019   Generalized anxiety disorder 09/30/2013   GERD (gastroesophageal reflux disease)    Hematoma 04/24/2017   Herniation of cervical intervertebral disc with radiculopathy 04/19/2019   C3-4   History of diverticulitis 10/20/2013   With colonectomy    Hoarseness 10/25/2021   Hyperlipidemia 10/03/2013   AHA 10 year risk of 19.5% Feb 2015, encouraged to start Lipitor   Hyperparathyroidism due to lithium  therapy 03/30/2020   Per endocrinology as long as he has no sequela and calcium  is 1mg /dL from upper limits of normal ok to stay on  lithium . No extra calcium .    Impaired fasting glucose 09/30/2013   Irregular heart beat 01/27/2018   Irritable bowel syndrome with diarrhea 10/08/2017   Labile blood pressure 11/22/2021   Lipoma of neck 04/24/2017   Loose stools 01/27/2018   Low libido 10/08/2017   Lumbar degenerative disc disease 04/09/2021   LVH (left ventricular hypertrophy) 01/27/2018   Memory changes 08/30/2017   Nausea 10/25/2021   Neck pain 01/27/2018   NSAID induced gastritis 01/20/2019   OSA on CPAP 12/28/2013   Osteoarthritis of carpometacarpal joint of left thumb 01/05/2015   Pounding heartbeat 11/22/2021   Preventive measure 10/20/2013   Colonoscopy & EGD 2014 [facility] - Repeat EGD 2017, Colon 2019   Primary osteoarthritis of left knee 04/01/2021   Prolonged Q-T interval on ECG 11/22/2021   PVC (premature ventricular contraction) 06/15/2019   Reactive airway disease that is not asthma 09/13/2017   RLS (restless legs syndrome) 03/25/2017   Seborrheic keratoses 03/25/2017   Serum calcium  elevated 01/27/2018   Sinus bradycardia 04/04/2019   Taste impairment 11/22/2021    Objective:  Physical Exam: Vascular: DP/PT pulses 2/4 bilateral. CFT <3 seconds. Normal hair growth on digits. No edema.  Skin. No lacerations or abrasions bilateral feet.  Bilateral hallux nails thickened and with subungual debris and discoloration on right distal medial quadrant and affect whole left hallux nail.  Musculoskeletal: MMT 5/5 bilateral lower extremities in DF, PF, Inversion and Eversion. Deceased ROM in DF of ankle joint.  Neurological: Sensation  intact to light touch.   Assessment:   1. Onychomycosis       Plan:  Patient was evaluated and treated and all questions answered. -Examined patient -Discussed treatment options for painful dystrophic nails  -Culture is positive for fungus. Bi morphic fungus. PCR negative.  -Discussed fungal nail treatment options including oral, topical, and laser treatments.  -LFTS reviewed and wnl.   -Patient would like to try penlac  and will consider laser treatments and let me know -Patient to return in 3 months for recheck.    Asberry Failing, DPM

## 2024-05-09 ENCOUNTER — Other Ambulatory Visit: Payer: Self-pay | Admitting: Physician Assistant

## 2024-05-09 DIAGNOSIS — G250 Essential tremor: Secondary | ICD-10-CM

## 2024-05-09 DIAGNOSIS — M51369 Other intervertebral disc degeneration, lumbar region without mention of lumbar back pain or lower extremity pain: Secondary | ICD-10-CM

## 2024-05-09 DIAGNOSIS — J302 Other seasonal allergic rhinitis: Secondary | ICD-10-CM

## 2024-05-09 DIAGNOSIS — I10 Essential (primary) hypertension: Secondary | ICD-10-CM

## 2024-05-09 DIAGNOSIS — N401 Enlarged prostate with lower urinary tract symptoms: Secondary | ICD-10-CM

## 2024-05-09 DIAGNOSIS — L299 Pruritus, unspecified: Secondary | ICD-10-CM

## 2024-05-09 NOTE — Telephone Encounter (Signed)
 aspirin  81 MG tablet -- NOT allowing edits, or pending of medication

## 2024-05-09 NOTE — Telephone Encounter (Signed)
 Copied from CRM [id number]. Topic: Clinical - Medication Refill >> May 09, 2024  4:17 PM Wess S wrote: Medication: amLODipine  (NORVASC ) 10 MG tablet  aspirin  81 MG tablet  Baclofen  5 MG TABS  cetirizine  (ZYRTEC ) 10 MG tablet  cholecalciferol (VITAMIN D3) 25 MCG (1000 UNIT) tablet  DULoxetine  (CYMBALTA ) 20 MG capsule  Omega-3 Fatty Acids (FISH OIL) 1000 MG CAPS  gabapentin  (NEURONTIN ) 300 MG capsule  hydrOXYzine  (ATARAX ) 10 MG tablet  lamoTRIgine (LAMICTAL) 25 MG tablet  sertraline  (ZOLOFT ) 100 MG tablet  tamsulosin  (FLOMAX ) 0.4 MG CAPS capsule  Has the patient contacted their pharmacy? Yes (Agent: If no, request that the patient contact the pharmacy for the refill. If patient does not wish to contact the pharmacy document the reason why and proceed with request.) (Agent: If yes, when and what did the pharmacy advise?) Pharmacy called  This is the patient's preferred pharmacy:  [facility] [address] [place], [place] [address] Phone: [phone] Fax: [phone]  Is this the correct pharmacy for this prescription? Yes If no, delete pharmacy and type the correct one.   Has the prescription been filled recently? Yes  Is the patient out of the medication? No  Has the patient been seen for an appointment in the last year OR does the patient have an upcoming appointment? Yes  Can we respond through MyChart? Yes  Agent: Please be advised that Rx refills may take up to 3 business days. We ask that you follow-up with your pharmacy.

## 2024-05-10 ENCOUNTER — Ambulatory Visit (INDEPENDENT_AMBULATORY_CARE_PROVIDER_SITE_OTHER): Admitting: Physician Assistant

## 2024-05-10 VITALS — BP 136/64 | HR 66 | Ht 70.0 in | Wt 202.0 lb

## 2024-05-10 DIAGNOSIS — S0085XA Superficial foreign body of other part of head, initial encounter: Secondary | ICD-10-CM

## 2024-05-10 DIAGNOSIS — Z79899 Other long term (current) drug therapy: Secondary | ICD-10-CM | POA: Diagnosis not present

## 2024-05-10 DIAGNOSIS — R519 Headache, unspecified: Secondary | ICD-10-CM

## 2024-05-10 DIAGNOSIS — L309 Dermatitis, unspecified: Secondary | ICD-10-CM

## 2024-05-10 DIAGNOSIS — R239 Unspecified skin changes: Secondary | ICD-10-CM | POA: Diagnosis not present

## 2024-05-10 DIAGNOSIS — Z23 Encounter for immunization: Secondary | ICD-10-CM

## 2024-05-10 MED ORDER — BACLOFEN 5 MG PO TABS: 5.0000 mg | ORAL_TABLET | Freq: Two times a day (BID) | ORAL | 1 refills | Status: AC | PRN

## 2024-05-10 MED ORDER — LAMOTRIGINE 25 MG PO TABS: 25.0000 mg | ORAL_TABLET | Freq: Every day | ORAL | 1 refills | Status: AC

## 2024-05-10 MED ORDER — CETIRIZINE HCL 10 MG PO TABS: 10.0000 mg | ORAL_TABLET | Freq: Every day | ORAL | 1 refills | Status: AC

## 2024-05-10 MED ORDER — DULOXETINE HCL 20 MG PO CPEP: 20.0000 mg | ORAL_CAPSULE | Freq: Every day | ORAL | 1 refills | Status: AC

## 2024-05-10 MED ORDER — GABAPENTIN 300 MG PO CAPS
300.0000 mg | ORAL_CAPSULE | Freq: Three times a day (TID) | ORAL | 3 refills | Status: AC
Start: 2024-05-10 — End: ?

## 2024-05-10 MED ORDER — SERTRALINE HCL 100 MG PO TABS: 100.0000 mg | ORAL_TABLET | Freq: Every day | ORAL | 1 refills | Status: AC

## 2024-05-10 NOTE — Telephone Encounter (Signed)
 Baclofen  last filled 10/07/2023 by Asberry Rising  lamoTRIgine last filled 02/19/2023 by Historical Provider  Sertraline  last filled by Historical Provider  Last appointment today   Upcoming appointment 09/24/2023

## 2024-05-10 NOTE — Patient Instructions (Signed)
Will make referral to dermatology

## 2024-05-10 NOTE — Progress Notes (Signed)
 Acute Office Visit  Subjective:     Patient ID: Alexander [name], male    DOB: 1951-03-16, 73 y.o.   MRN: [id number]  Chief Complaint  Patient presents with   Medical Management of Chronic Issues    HPI Discussed the use of AI scribe software for clinical note transcription with the patient, who gave verbal consent to proceed.  History of Present Illness Alexander [name] is a 73 year old male who presents with a persistent sensation of a foreign body under the skin in his left cheek.   - Persistent sensation of a foreign body under skin of left cheek, described as feeling like 'a shard or something' that is not visible - Sensation is bothersome but does not cause significant pain - Sensation is particularly noticeable with pressure, such as when using CPAP machine - Previously attempted to cut foreign body out on 8/20, which provided temporary relief for a couple of weeks, but the sensation has recurred  Facial rash associated with cpap use - Rash on the face, likely related to CPAP use - Rash is causing discomfort - No current use of facial moisturizer     ROS See HPI.      Objective:    BP 136/64   Pulse 66   Ht 5' 10 (1.778 m)   Wt 202 lb (91.6 kg)   SpO2 96%   BMI 28.98 kg/m  BP Readings from Last 3 Encounters:  05/10/24 136/64  03/23/24 (!) 153/43  10/14/23 122/88   Wt Readings from Last 3 Encounters:  05/10/24 202 lb (91.6 kg)  03/23/24 204 lb (92.5 kg)  10/14/23 195 lb 8 oz (88.7 kg)      Physical Exam Constitutional:      Appearance: Normal appearance.  HENT:     Head: Normocephalic.  Cardiovascular:     Rate and Rhythm: Normal rate and regular rhythm.  Pulmonary:     Effort: Pulmonary effort is normal.     Breath sounds: Normal breath sounds.  Skin:    Comments: No palpation of foreign body over left cheek. Erythematous macular rash over bilateral cheeks.   Neurological:     General: No focal deficit present.     Mental Status: He is  alert and oriented to person, place, and time.  Psychiatric:        Mood and Affect: Mood normal.          Assessment & Plan:  SABRASABRASilvano Garofano was seen today for medical management of chronic issues.  Diagnoses and all orders for this visit:  Foreign body in skin of cheek -     Ambulatory referral to Dermatology  Pain of cheek -     Ambulatory referral to Dermatology  Medication management -     CBC w/Diff/Platelet -     CMP14+EGFR -     Lithium  level  Immunization due -     Flu vaccine trivalent PF, 6mos and older(Flulaval,Afluria,Fluarix,Fluzone) -     Pfizer Comirnaty Covid -19 Vaccine 25yrs and older  Recent skin changes -     Ambulatory referral to Dermatology  Facial dermatitis -     Ambulatory referral to Dermatology    Assessment & Plan Foreign body sensation under skin in left cheek  possibly due to a small shard or foreign body. Discussed potential for protective capsule formation and eventual surfacing. - Refer to dermatologist for evaluation and management. - I do not want to cut into cheek again  Facial rash  due to CPAP use Facial rash likely from CPAP use, worsened by lack of moisturizing. - Advise moisturizing with non-scented moisturizer such as Cetaphil or Aveeno, morning and night. - Refer to dermatologist for evaluation and management.  General Health Maintenance Due for colonoscopy. Discussed updating vaccinations including flu and COVID vaccines. - Administer flu vaccine. - Administer COVID vaccine. - Check records for last colonoscopy date and schedule if due.    Lucah Petta, PA-C

## 2024-05-11 ENCOUNTER — Ambulatory Visit: Payer: Self-pay | Admitting: Physician Assistant

## 2024-05-11 LAB — CMP14+EGFR
ALT: 21 IU/L (ref 0–44)
AST: 25 IU/L (ref 0–40)
Albumin: 4.2 g/dL (ref 3.8–4.8)
Alkaline Phosphatase: 95 IU/L (ref 47–123)
BUN/Creatinine Ratio: 23 (ref 10–24)
BUN: 28 mg/dL — ABNORMAL HIGH (ref 8–27)
Bilirubin Total: 0.6 mg/dL (ref 0.0–1.2)
CO2: 18 mmol/L — ABNORMAL LOW (ref 20–29)
Calcium: 10.9 mg/dL — ABNORMAL HIGH (ref 8.6–10.2)
Chloride: 106 mmol/L (ref 96–106)
Creatinine, Ser: 1.23 mg/dL (ref 0.76–1.27)
Globulin, Total: 2.2 g/dL (ref 1.5–4.5)
Glucose: 105 mg/dL — ABNORMAL HIGH (ref 70–99)
Potassium: 4.9 mmol/L (ref 3.5–5.2)
Sodium: 137 mmol/L (ref 134–144)
Total Protein: 6.4 g/dL (ref 6.0–8.5)
eGFR: 62 mL/min/1.73 (ref >59)

## 2024-05-11 LAB — CBC WITH DIFFERENTIAL/PLATELET
Basophils Absolute: 0.1 x10E3/uL (ref 0.0–0.2)
Basos: 1 %
EOS (ABSOLUTE): 0.2 x10E3/uL (ref 0.0–0.4)
Eos: 3 %
Hematocrit: 37.5 % (ref 37.5–51.0)
Hemoglobin: 12.2 g/dL — ABNORMAL LOW (ref 13.0–17.7)
Immature Grans (Abs): 0 x10E3/uL (ref 0.0–0.1)
Immature Granulocytes: 0 %
Lymphocytes Absolute: 1.5 x10E3/uL (ref 0.7–3.1)
Lymphs: 22 %
MCH: 32 pg (ref 26.6–33.0)
MCHC: 32.5 g/dL (ref 31.5–35.7)
MCV: 98 fL — ABNORMAL HIGH (ref 79–97)
Monocytes Absolute: 0.4 x10E3/uL (ref 0.1–0.9)
Monocytes: 6 %
Neutrophils Absolute: 4.5 x10E3/uL (ref 1.4–7.0)
Neutrophils: 68 %
Platelets: 216 x10E3/uL (ref 150–450)
RBC: 3.81 x10E6/uL — ABNORMAL LOW (ref 4.14–5.80)
RDW: 12.6 % (ref 11.6–15.4)
WBC: 6.6 x10E3/uL (ref 3.4–10.8)

## 2024-05-11 LAB — LITHIUM LEVEL: Lithium Lvl: 0.7 mmol/L (ref 0.5–1.2)

## 2024-05-11 NOTE — Progress Notes (Signed)
 Kidney shows some improvement but still dry! Drink more water.

## 2024-05-12 DIAGNOSIS — F3130 Bipolar disorder, current episode depressed, mild or moderate severity, unspecified: Secondary | ICD-10-CM | POA: Diagnosis not present

## 2024-05-13 ENCOUNTER — Other Ambulatory Visit: Payer: Self-pay | Admitting: Physician Assistant

## 2024-05-13 DIAGNOSIS — I10 Essential (primary) hypertension: Secondary | ICD-10-CM

## 2024-05-13 DIAGNOSIS — N401 Enlarged prostate with lower urinary tract symptoms: Secondary | ICD-10-CM

## 2024-05-13 DIAGNOSIS — L299 Pruritus, unspecified: Secondary | ICD-10-CM

## 2024-05-13 DIAGNOSIS — L309 Dermatitis, unspecified: Secondary | ICD-10-CM | POA: Insufficient documentation

## 2024-05-13 MED ORDER — TAMSULOSIN HCL 0.4 MG PO CAPS: 0.4000 mg | ORAL_CAPSULE | Freq: Every day | ORAL | 3 refills | Status: AC

## 2024-05-13 MED ORDER — HYDROXYZINE HCL 10 MG PO TABS: 10.0000 mg | ORAL_TABLET | Freq: Three times a day (TID) | ORAL | 1 refills | Status: DC | PRN

## 2024-05-13 MED ORDER — AMLODIPINE BESYLATE 10 MG PO TABS
10.0000 mg | ORAL_TABLET | Freq: Every day | ORAL | 3 refills | Status: AC
Start: 2024-05-13 — End: ?

## 2024-05-13 NOTE — Telephone Encounter (Signed)
 Copied from CRM [id number]. Topic: Clinical - Medication Refill >> May 13, 2024 10:28 AM Sophia H wrote: Medication: tamsulosin  (FLOMAX ) 0.4 MG CAPS capsule amLODipine  (NORVASC ) 10 MG tablet hydrOXYzine  (ATARAX ) 10 MG tablet  Has the patient contacted their pharmacy? Yes, pharmacy calling on behalf of the patient. State they did not receive updated RX's for meds above.    This is the patient's preferred pharmacy:   [facility] [place], [place] - [address] [place] [address] [place] [address] Phone: [phone] Fax: [phone]  Is this the correct pharmacy for this prescription? Yes If no, delete pharmacy and type the correct one.   Has the prescription been filled recently? In the last year.   Is the patient out of the medication? Yes  Has the patient been seen for an appointment in the last year OR does the patient have an upcoming appointment? Yes, seen 10/07.  Can we respond through MyChart? Yes  Agent: Please be advised that Rx refills may take up to 3 business days. We ask that you follow-up with your pharmacy.

## 2024-05-16 ENCOUNTER — Encounter: Payer: Self-pay | Admitting: Physician Assistant

## 2024-05-16 NOTE — Telephone Encounter (Signed)
 Spoke with patient daughter , Burnard,  and informed of results.

## 2024-05-24 DIAGNOSIS — L814 Other melanin hyperpigmentation: Secondary | ICD-10-CM | POA: Diagnosis not present

## 2024-05-24 DIAGNOSIS — L299 Pruritus, unspecified: Secondary | ICD-10-CM | POA: Diagnosis not present

## 2024-05-24 DIAGNOSIS — L570 Actinic keratosis: Secondary | ICD-10-CM | POA: Diagnosis not present

## 2024-05-24 DIAGNOSIS — L578 Other skin changes due to chronic exposure to nonionizing radiation: Secondary | ICD-10-CM | POA: Diagnosis not present

## 2024-06-02 ENCOUNTER — Encounter: Payer: Self-pay | Admitting: Podiatry

## 2024-06-02 ENCOUNTER — Ambulatory Visit: Admitting: Podiatry

## 2024-06-02 DIAGNOSIS — M205X1 Other deformities of toe(s) (acquired), right foot: Secondary | ICD-10-CM

## 2024-06-02 DIAGNOSIS — B351 Tinea unguium: Secondary | ICD-10-CM

## 2024-06-02 MED ORDER — DEXAMETHASONE SODIUM PHOSPHATE 120 MG/30ML IJ SOLN
4.0000 mg | Freq: Once | INTRAMUSCULAR | Status: AC
  Administered 2024-06-02: 4 mg via INTRA_ARTICULAR

## 2024-06-02 MED ORDER — CICLOPIROX 8 % EX SOLN: Freq: Every day | CUTANEOUS | 0 refills | Status: AC

## 2024-06-02 MED ORDER — TRIAMCINOLONE ACETONIDE 10 MG/ML IJ SUSP
2.5000 mg | Freq: Once | INTRAMUSCULAR | Status: AC
  Administered 2024-06-02: 2.5 mg via INTRA_ARTICULAR

## 2024-06-02 NOTE — Progress Notes (Signed)
 Subjective:  Patient ID: Alexander [name], male    DOB: 08/06/1950,   MRN: [id number]  Chief Complaint  Patient presents with   Foot Pain    Pt here for R bunion 1st met dorsal pain.  Not diabetic. 81 mg Asprin    73 y.o. male presents for  follow-up of fungal nails and to discuss culture results.  Also relates new concern of right great toe pain has been going on for quite a while.  Relateshe is s the bump on the side of the toenail has recently been worsening and can only wear slippers to accommodate for the pain.  Denies any other treatments.  Relates significant stiffness in the joint. Denies any other pedal complaints. Denies n/v/f/c.   Past Medical History:  Diagnosis Date   Accessory skin tags 04/28/2018   Acute non-recurrent pansinusitis 08/12/2017   Arthralgia of multiple joints 12/30/2019   Barrett's esophagus 09/30/2013   Bilateral lower extremity edema 09/13/2017   Bilateral primary osteoarthritis of hip 10/03/2021   Bipolar 1 disorder, mixed (HCC) 09/30/2013   [facility], Dr. Karolynn    BPH (benign prostatic hyperplasia) 09/30/2013   Cervical spondylosis 09/15/2018   Chronic pain disorder 11/01/2018   Chronic pain of right knee 03/25/2017   DDD (degenerative disc disease), cervical 01/29/2018   Diarrhea 03/25/2017   Diverticulitis 2007   Dizziness 11/22/2021   Ear mass, left 06/06/2016   Biopsy- Actinic keratosis via biopsy.    Elevated calcitonin level 12/30/2019   Elevated fasting glucose 08/30/2017   Elevated PTHrP level 12/30/2019   Erectile dysfunction 07/15/2018   Essential hypertension, benign 09/30/2013   Essential tremor 12/30/2019   Fusion of spine of cervical region 04/19/2019   Generalized anxiety disorder 09/30/2013   GERD (gastroesophageal reflux disease)    Hematoma 04/24/2017   Herniation of cervical intervertebral disc with radiculopathy 04/19/2019   C3-4   History of diverticulitis 10/20/2013   With colonectomy    Hoarseness 10/25/2021   Hyperlipidemia  10/03/2013   AHA 10 year risk of 19.5% Feb 2015, encouraged to start Lipitor   Hyperparathyroidism due to lithium  therapy 03/30/2020   Per endocrinology as long as he has no sequela and calcium  is 1mg /dL from upper limits of normal ok to stay on lithium . No extra calcium .    Impaired fasting glucose 09/30/2013   Irregular heart beat 01/27/2018   Irritable bowel syndrome with diarrhea 10/08/2017   Labile blood pressure 11/22/2021   Lipoma of neck 04/24/2017   Loose stools 01/27/2018   Low libido 10/08/2017   Lumbar degenerative disc disease 04/09/2021   LVH (left ventricular hypertrophy) 01/27/2018   Memory changes 08/30/2017   Nausea 10/25/2021   Neck pain 01/27/2018   NSAID induced gastritis 01/20/2019   OSA on CPAP 12/28/2013   Osteoarthritis of carpometacarpal joint of left thumb 01/05/2015   Pounding heartbeat 11/22/2021   Preventive measure 10/20/2013   Colonoscopy & EGD 2014 [facility] - Repeat EGD 2017, Colon 2019   Primary osteoarthritis of left knee 04/01/2021   Prolonged Q-T interval on ECG 11/22/2021   PVC (premature ventricular contraction) 06/15/2019   Reactive airway disease that is not asthma 09/13/2017   RLS (restless legs syndrome) 03/25/2017   Seborrheic keratoses 03/25/2017   Serum calcium  elevated 01/27/2018   Sinus bradycardia 04/04/2019   Taste impairment 11/22/2021    Objective:  Physical Exam: Vascular: DP/PT pulses 2/4 bilateral. CFT <3 seconds. Normal hair growth on digits. No edema.  Skin. No lacerations or abrasions bilateral  feet.  Bilateral hallux nails thickened and with subungual debris and discoloration on right distal medial quadrant and affect whole left hallux nail.  Musculoskeletal: MMT 5/5 bilateral lower extremities in DF, PF, Inversion and Eversion. Deceased ROM in DF of ankle joint.  Tender to right hallux first MPJ medial eminence.  Minimal range of motion of the first MPJ with tenderness to dorsiflexion. Neurological: Sensation intact to light touch.    Assessment:   1. Hallux limitus, right   2. Onychomycosis        Plan:  Patient was evaluated and treated and all questions answered. -Examined patient -Discussed treatment options for painful dystrophic nails  -Culture is positive for fungus. Bi morphic fungus. PCR negative.  -Discussed fungal nail treatment options including oral, topical, and laser treatments.  - Will continue Penlac .   -Xrays reviewed.  No acute fractures or dislocations noted significant degenerative changes noted to the first MPJ. -Discussed hallux limitus and  treatement options; conservative and  Surgical management; risks, benefits, alternatives discussed. All patient's questions answered. -Patient opted for injection of first MTPJ. After verbal consent, injected into right/left 1st MTPJ for symptomatic relief 1cc marcaine  plain, 1cc dexamethasone  without complication.   -Recommend continue with good supportive shoes and inserts. Discussed stiff soled shoes and use of carbon fiber foot plate.   Discussed possible need for surgical invention intervention including cheilectomy versus fusion of the first MPJ.  Discussed if injection does not help we will consider these options -Patient to return to office 4 weeks for recheck    Asberry Failing, DPM

## 2024-06-16 NOTE — Progress Notes (Signed)
 Alexander [name]                                          MRN: [id number]   06/16/2024   The [facility] Quality Team Specialist reviewed this patient medical record for the purposes of chart review for care gap closure. The following were reviewed: abstraction for care gap closure-controlling blood pressure.    [facility] Quality Team

## 2024-06-23 ENCOUNTER — Ambulatory Visit: Admitting: Podiatry

## 2024-07-04 DIAGNOSIS — K047 Periapical abscess without sinus: Secondary | ICD-10-CM | POA: Diagnosis not present

## 2024-07-09 ENCOUNTER — Other Ambulatory Visit: Payer: Self-pay | Admitting: Physician Assistant

## 2024-07-09 DIAGNOSIS — L299 Pruritus, unspecified: Secondary | ICD-10-CM

## 2024-07-22 ENCOUNTER — Other Ambulatory Visit: Payer: Self-pay | Admitting: Physician Assistant

## 2024-07-22 DIAGNOSIS — I10 Essential (primary) hypertension: Secondary | ICD-10-CM

## 2024-08-05 ENCOUNTER — Ambulatory Visit: Admitting: Podiatry

## 2024-09-23 ENCOUNTER — Ambulatory Visit: Admitting: Physician Assistant
